# Patient Record
Sex: Male | Born: 1940 | Race: White | Hispanic: No | Marital: Married | Smoking: Never smoker
Health system: Southern US, Community
[De-identification: ages and names within clinical notes are randomized; demographics above are authoritative.]

## PROBLEM LIST (undated history)

## (undated) DIAGNOSIS — T8859XA Other complications of anesthesia, initial encounter: Secondary | ICD-10-CM

## (undated) DIAGNOSIS — F419 Anxiety disorder, unspecified: Secondary | ICD-10-CM

## (undated) DIAGNOSIS — H9191 Unspecified hearing loss, right ear: Secondary | ICD-10-CM

## (undated) DIAGNOSIS — I10 Essential (primary) hypertension: Secondary | ICD-10-CM

## (undated) DIAGNOSIS — C61 Malignant neoplasm of prostate: Secondary | ICD-10-CM

## (undated) DIAGNOSIS — I499 Cardiac arrhythmia, unspecified: Secondary | ICD-10-CM

## (undated) DIAGNOSIS — Z87438 Personal history of other diseases of male genital organs: Secondary | ICD-10-CM

## (undated) DIAGNOSIS — T4145XA Adverse effect of unspecified anesthetic, initial encounter: Secondary | ICD-10-CM

## (undated) DIAGNOSIS — R29810 Facial weakness: Secondary | ICD-10-CM

## (undated) DIAGNOSIS — E785 Hyperlipidemia, unspecified: Secondary | ICD-10-CM

## (undated) DIAGNOSIS — F329 Major depressive disorder, single episode, unspecified: Secondary | ICD-10-CM

## (undated) DIAGNOSIS — G709 Myoneural disorder, unspecified: Secondary | ICD-10-CM

## (undated) DIAGNOSIS — F32A Depression, unspecified: Secondary | ICD-10-CM

## (undated) DIAGNOSIS — E119 Type 2 diabetes mellitus without complications: Secondary | ICD-10-CM

## (undated) DIAGNOSIS — Q8502 Neurofibromatosis, type 2: Secondary | ICD-10-CM

## (undated) DIAGNOSIS — Z87898 Personal history of other specified conditions: Secondary | ICD-10-CM

## (undated) DIAGNOSIS — I482 Chronic atrial fibrillation, unspecified: Secondary | ICD-10-CM

## (undated) HISTORY — PX: EYE SURGERY: SHX253

## (undated) HISTORY — PX: CHOLECYSTECTOMY: SHX55

## (undated) HISTORY — PX: TRANSTHORACIC ECHOCARDIOGRAM: SHX275

## (undated) HISTORY — PX: OTHER SURGICAL HISTORY: SHX169

---

## 1988-10-20 HISTORY — PX: CARDIAC CATHETERIZATION: SHX172

## 1989-06-20 HISTORY — PX: UMBILICAL HERNIA REPAIR: SHX196

## 1999-10-21 ENCOUNTER — Emergency Department (HOSPITAL_COMMUNITY): Admission: EM | Admit: 1999-10-21 | Discharge: 1999-10-21 | Payer: Self-pay | Admitting: Emergency Medicine

## 2001-01-14 ENCOUNTER — Encounter: Payer: Self-pay | Admitting: Internal Medicine

## 2001-01-14 ENCOUNTER — Ambulatory Visit (HOSPITAL_COMMUNITY): Admission: RE | Admit: 2001-01-14 | Discharge: 2001-01-14 | Payer: Self-pay | Admitting: Internal Medicine

## 2001-02-10 ENCOUNTER — Encounter: Payer: Self-pay | Admitting: Neurosurgery

## 2001-02-12 ENCOUNTER — Inpatient Hospital Stay (HOSPITAL_COMMUNITY): Admission: RE | Admit: 2001-02-12 | Discharge: 2001-02-22 | Payer: Self-pay | Admitting: Neurosurgery

## 2001-02-12 HISTORY — PX: CRANIECTOMY FOR EXCISION OF ACOUSTIC NEUROMA: SUR324

## 2001-11-29 ENCOUNTER — Encounter (INDEPENDENT_AMBULATORY_CARE_PROVIDER_SITE_OTHER): Payer: Self-pay | Admitting: Specialist

## 2001-11-29 ENCOUNTER — Ambulatory Visit (HOSPITAL_BASED_OUTPATIENT_CLINIC_OR_DEPARTMENT_OTHER): Admission: RE | Admit: 2001-11-29 | Discharge: 2001-11-30 | Payer: Self-pay | Admitting: Specialist

## 2002-08-02 ENCOUNTER — Encounter: Admission: RE | Admit: 2002-08-02 | Discharge: 2002-08-02 | Payer: Self-pay | Admitting: Internal Medicine

## 2002-08-02 ENCOUNTER — Encounter: Payer: Self-pay | Admitting: Internal Medicine

## 2003-02-06 ENCOUNTER — Encounter (INDEPENDENT_AMBULATORY_CARE_PROVIDER_SITE_OTHER): Payer: Self-pay | Admitting: *Deleted

## 2003-02-06 ENCOUNTER — Ambulatory Visit (HOSPITAL_BASED_OUTPATIENT_CLINIC_OR_DEPARTMENT_OTHER): Admission: RE | Admit: 2003-02-06 | Discharge: 2003-02-06 | Payer: Self-pay | Admitting: Specialist

## 2007-06-28 ENCOUNTER — Emergency Department (HOSPITAL_COMMUNITY): Admission: EM | Admit: 2007-06-28 | Discharge: 2007-06-28 | Payer: Self-pay | Admitting: Emergency Medicine

## 2007-09-20 ENCOUNTER — Encounter: Admission: RE | Admit: 2007-09-20 | Discharge: 2007-09-20 | Payer: Self-pay | Admitting: Internal Medicine

## 2007-11-26 ENCOUNTER — Encounter (INDEPENDENT_AMBULATORY_CARE_PROVIDER_SITE_OTHER): Payer: Self-pay | Admitting: Urology

## 2007-11-26 ENCOUNTER — Ambulatory Visit (HOSPITAL_BASED_OUTPATIENT_CLINIC_OR_DEPARTMENT_OTHER): Admission: RE | Admit: 2007-11-26 | Discharge: 2007-11-26 | Payer: Self-pay | Admitting: Urology

## 2007-11-26 HISTORY — PX: HYDROCELE EXCISION: SHX482

## 2011-03-04 NOTE — Op Note (Signed)
NAME:  TREVAR, BOEHRINGER               ACCOUNT NO.:  [id number]   MEDICAL RECORD NO.:  [id number]          PATIENT TYPE:  AMB   LOCATION:  [facility]                         FACILITY:  [facility]   PHYSICIAN:  Ronald L. Earlene Plater, M.D.  DATE OF BIRTH:  05-12-1941   DATE OF PROCEDURE:  11/26/2007  DATE OF DISCHARGE:                               OPERATIVE REPORT   DIAGNOSIS:  Right hydrocele.   OPERATIVE PROCEDURE:  Right hydrocelectomy.   SURGEON:  Laurin Coder, MD   ANESTHESIA:  LMA.   ESTIMATED BLOOD LOSS:  Fifteen mL.   TUBES:  Quarter inch Penrose drain.   COMPLICATIONS:  None.   INDICATIONS FOR PROCEDURE:  Mr. Chausse is a very nice 70 year old white  male who presented with enlarging bilateral hydroceles, the left  hydrocele was pretty minimal but the right hydrocele was fairly large.  It had become unsightly and uncomfortable and an ultrasound appeared to  be a simple hydrocele.  After understanding risks, benefits and  alternatives, stopping his Coumadin and cleared for surgery he has  elected to proceed.   PROCEDURE IN DETAIL:  The patient was placed in supine position after  proper LMA anesthesia and was prepped and draped with Betadine in a  sterile fashion.  A transverse right scrotal incision was made.  Sharp  dissection was carried down to the hydrocele sac.  It was drained with  straw-colored fluid and delivered on the field.  There were several  loculations that were opened.  The redundant tunica vaginalis which was  somewhat thin was taken down from the cord, excised and submitted as  hydrocele sac and a bottle neck type closure was made with a running 4-0  chromic catgut carefully avoiding any constriction on the cord.  All  mucosal surfaces were exposed.  Good hemostasis was noted to be present.  The testicle and adnexa were placed back into the scrotal sac.  A  quarter inch Penrose drain was placed through a separate stab incision  and sutured in place with 3-0 chromic  catgut.  The dartos tunic was then  closed with a running locked 3-0 chromic catgut.  The skin was closed  with a running horizontal mattress 3-0 chromic catgut and dressed with  fluffs and scrotal support.  The patient tolerated the procedure well  and was taken to the recovery room stable.      Ronald L. Earlene Plater, M.D.  Electronically Signed     RLD/MEDQ  D:  11/26/2007  T:  11/27/2007  Job:  [id number]

## 2011-03-07 NOTE — Op Note (Signed)
NAME:  Tommy Weeks, Tommy Weeks                           ACCOUNT NO.:  [id number]   MEDICAL RECORD NO.:  [id number]                   PATIENT TYPE:  AMB   LOCATION:  [facility]                                  FACILITY:  [facility]   PHYSICIAN:  Earvin Hansen L. Shon Weeks, M.D.           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  The patient had previously undergone some surgery for some  suspensions but now has some sagging at the brow, right lateral canthal  area, and the right commissure of his mouth and facial area still.  He has  some improvement but we tried over the last year intensive physical therapy  including neurostimulation of the areas with no avail.   PROCEDURES:  Exploration of the right face, retightening of previous fascial  slings; fasciculata grafts to the right brow, right lateral orbital region,  and orbicularis muscle.  Insertion at the angularis of the right mouth and  cheek areas.  Right lateral canthoplasty and relocation.   ANESTHESIA:  General.   SURGEON:  Tommy Weeks, M.D.   FIRST ASSISTANT:  Dalbert Garnet, CFA and OPA   DESCRIPTION OF PROCEDURE:  The patient was taken to the operating room,  placed on the operating room table in the supine position.  He was given  adequate general anesthesia and intubated orally.  Prep was done to his face  and neck areas with Hibiclens solution.  The eyes were protected with  Betadine swabs, walled off with sterile towels and draped so as to make a  sterile field.  A marking pen was used to outline the vectors of lift in the  right orbital and the right facial areas.  The skin flaps were anesthetized  with Xylocaine 0.5% with epinephrine 1:200,000 concentration, a total of 60  mL.  After  waiting an appropriate amount of time for vasoconstriction to  take place, a facelift incision was made through the right forehead, across  the hairline area down to the sideburns and the preauricular incision was  carried down to the ear lobule.  Skin flap was dissected over the areas  exposing the previously rotated temporalis major flap that was rotated down  to lift the brow, right and left lateral contours, and the canthi and hooked  into the orbital musculature.  There was intense scarring in this area.  I  was able to dissect out the canthal ligament using a sharp dissection and  blunt dissection.  I was able to transect it and relocate it into the right  periosteal area using a 2-0 Ethibond suture.  The orbicularis oculus muscle  was also tightened back up, as well as previous fascia  slings that were  isolated out with some difficulty because of increased scarring and they  were suspended up using the 2-0 Ethibond suture.  The same was carried out  over the right lateral face mastoid area.  Insertions into the angle of the  mouth were still intact but there seemed to have been some loosening of the  fascia proximally.  This was dissected out with tedious dissection.  It was  then replanted back into the upper part of the SMAS to give good lifting to  the corner of the mouth, facial areas, as well as right lateral canthus and  orbicularis area.  A brow was also lifted more by grasping some of the  frontalis musculature still present and then suturing it in a plicating  fashion to the periosteum.  After prompt hemostasis, the skin flaps were  then brought up and excised appropriately.  Subcutaneous closure was done  with 3-0 Monocryl x2 layers and a running 5-0 nylon and skin staples  throughout the area.   ADDENDUM:  We were able to do some more fascial slings to the right lateral  canthi area and brow by retrieving fascia from the right lateral thigh area  with a fascial stripper.   These were tied in with 4-0 Mersilene sutures.  The same was done for the right corner of the mouth.  All the wounds were  cleansed.  Steri-Strips and a soft dressing applied appropriately.  Xeroform, 4 x 4's, a facial dressing, and a thigh dressing.  He withstood  the procedure very well and was taken to recovery in excellent condition.   ESTIMATED BLOOD LOSS:  100 mL.   COMPLICATIONS:  None.                                               Yaakov Guthrie. Shon Weeks, M.D.    Cathie Hoops  D:  02/06/2003  T:  02/06/2003  Job:  [id number]

## 2011-03-07 NOTE — H&P (Signed)
[place]. [facility]  Patient:    Tommy Weeks, Tommy Weeks                        MRN: [id number] Adm. Date:  02/12/01 Attending:  Payton Doughty, M.D.                         History and Physical  ADMISSION DIAGNOSIS: Right acoustic neuroma.  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  PAST MEDICAL HISTORY: Benign.  He has had a little bit of hypertension, for which he is on Toprol 150 mg q.d.  CURRENT MEDICATIONS:  1. Toprol 150 mg q.d.  2. Hydrochlorothiazide 12.5 mg q.d.  3. Lipitor 5 mg q.d.  4. Tricor 200 mg q.d.  ALLERGIES: He has a hypersensitivity reaction to VERSED and ROBINUL.  REVIEW OF SYSTEMS: He last week had a bout of dizziness and his blood pressure was elevated, but it was felt it was related to anxiety over the operation he. He is fine now.  PAST SURGICAL HISTORY:  1. Cholecystectomy in 1996.  2. Hernia repair in 1996 and 1989.  SOCIAL HISTORY: He neither smokes nor drinks.  He is an air traffic controller.  FAMILY HISTORY: Mother is in poor health, with cardiac disease.  Father is 58 and in fair health.  REVIEW OF SYSTEMS: Remarkable for wearing glasses, hearing loss, tinnitus, and hypertension.  PHYSICAL EXAMINATION:  HEENT: Within normal limits.  NECK: Good range of motion.  CHEST: Clear.  CARDIAC: Regular rate and rhythm.  ABDOMEN: Nontender, no hepatosplenomegaly.  EXTREMITIES: Without clubbing or cyanosis.  Peripheral pulses are good.  GU: Examination deferred.  NEUROLOGIC: He is awake and alert and oriented.  PERRL.  EOMI.  Has a little bit of diplopia and far lateral leftward gaze.  Facial sensation is diminished in the  right V2 and V3 distribution.  Facial movement appears to be normal. He has no hearing in his right ear.  The palate elevates in the midline.  He does not describe any swallowing difficulties.  Trapezius and sternocleidomastoid function appear normal.  Tongue is midline.  Motor examination demonstrates 5/5 strength throughout the upper and lower extremities, with no pronator drift.  There is no current sensory deficit. Reflexes are 1 throughout.  Toes downgoing bilaterally.  No Hoffmanns.  LABORATORY DATA: MRI demonstrates a homogeneously enhancing cystic lesion of the right cerebellar pontine angle, which appears to have extension into the right porus acusticus.  CLINICAL IMPRESSION: Probable acoustic neuroma.  It is less likely it is meningioma.  PLAN: The plan is for a right suboccipital craniectomy based on the tests for resection of acoustic neuroma.  Will do a seventh nerve monitoring.  The risks and benefits of this approach have been discussed with him and he wishes to proceed. DD:  02/12/01 TD:  02/12/01 Job: [id number]

## 2011-03-07 NOTE — Op Note (Signed)
[place]. [facility]  Patient:    Tommy Weeks, Tommy Weeks                        MRN: [id number] Proc. Date: 02/12/01 Adm. Date:  [id number] Attending:  Emeterio Reeve                           Operative Report  PREOPERATIVE DIAGNOSIS:  Right cerebellar pontine angle tumor.  POSTOPERATIVE DIAGNOSIS:  Right acoustic neuroma.  SURGEON:  Payton Doughty, M.D.  ANESTHESIA:  General endotracheal.  PREP:  Shave and prepped, scrubbed with alcohol wipe.  COMPLICATIONS:  None.  ASSISTANT:  Stefani Dama, M.D.  NOTE:  Intraoperative seventh cranial nerve monitoring was used.  BODY OF TEXT:  This is a 70 year old right-handed gentleman with a right cerebellar pontine angle tumor.  He was taken to the operating room, smoothly anesthetized, intubated and placed in left side down, right side up lateral decubitus position with the head turned slightly towards the left angling the petrous ridge at 90 degrees to the floor.  Following shave, prepped and draped in usual sterile fashion.  A lightning bolt shaped incision was made starting just above the mastoid on the right side, carried down to the superior nuchal line medially half way to the inion and then inferiorly approximately to the level of C2.  The occipital squama was uncovered and the high-speed drill perforator, Kerrison and a Leksell were used for craniectomy out to the ______ angle on the right paralleling the sigmoid sinus inferiorly and medially and superiorly along the transverse sinus.  Bone wax was used to control any bony bleeding.  The mastoid air cells were entered and they were packed with a piece of muscle obtained from the neck musculature.  The dura was opened in a stellate fashion and retractor placed on the superior edge of the cerebellum.  It was gently carried retracted inferomedially and the petrous bone was uncovered.  The chamber was immediately obvious.  It was densely adherent to the  cerebellum and the microscope was brought in.  Using the operating microscope and microdissection technique, the tumor was dissected free from the cerebellum.  The cyst and tumor was identified and opened and the tumor was immediately shrank in size.  The tumor was dissected off the cerebellum.  The seventh nerve was identified visually and then confirmed using intraoperative cranial nerve monitoring of the EMG and the stimulator.  The tumor was dissected off the seventh nerve, debulked internally and then removed grossly.  Following its removal, the fifth nerve was immediately obvious.  The brainstem was decompressed without any undue irritation.  The porus acusticus was quite expanded by the tumor.  The original intention had been to drill the porus, however, because of the large size of the porus, the tumor was simply curetted free.  This was done while monitoring the seventh nerve and there was absolutely no evidence of seventh nerve damage while removing the tumor from the porus.  The gross total resection was obtained.  The wound was irrigated and hemostasis assured.  The dura was reapproximated with 4-0 Nurolon in an interrupted fashion in a nonwatertight fashion.  The mastoids were carefully inspected and found to be completely waxed and packed with bone.  Gelfoam was placed over the craniectomy defect.  The paraspinous muscles were approximated with 0 Vicryl in an interrupted fashion.  Subcutaneous tissues were reapproximated with 0  Vicryl in an interrupted fashion.  Subcuticular tissues were reapproximated with 3-0 Vicryl in an interrupted fashion and the skin was closed with a 3-0 Vicryl in a running lock fashion.  A Betadine and Telfa dressing was applied and made occlusive with OpSite.  The patient returned to recovery room.  It should be noted that the stimulation threshold for the seventh nerve was 0.17 volts when we started and it increased 0.3 volts.  There was  good stimulation obtained throughout the case. DD:  02/12/01 TD:  02/13/01 Job: [id number]

## 2011-03-07 NOTE — Op Note (Signed)
[place]. [facility]  Patient:    Tommy Weeks, Tommy Weeks Visit Number: [id number] MRN: [id number]          Service Type: DSU Location: [facility] Attending Physician:  Gustavus Messing Dictated by:   Yaakov Guthrie. Shon Hough, M.D. Proc. Date: 11/29/01 Admit Date:  11/29/2001   CC:         Payton Doughty, M.D.   Operative Report  INDICATIONS:  This 70 year old gentleman is status post right acoustic neuroma excision with salvage of the right facial nerve.  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  He has increased exotropia involving the right lower lid causing him to use eye drops incessantly during the day and the night.  He also has visual field discrepancy with the right superior field distorted, secondary to ptosis of his right brow.  He has sagging of his right facial and right neck and jaw areas, with inability to smile or to close the right commissure.  PROCEDURES PLANNED 1. Exploration of the right face, right temporalis muscle and nerve pedicle. 2. Flap reconstruction to be swung to the right lower lid, medial    canthal area. 3. Right brow lift using the miniplate system. 4. Right face lift with plication of this superficial musculoaponeurotic, as    well as bilateral masseter muscle splits with connection to the right    upper and lower lip commissure areas. 5. The patient also demonstrates imbalance of the right and left face.  He    will have a left face lift to improve the symmetry of the above-proposed    procedures. 6. In addition the patient will have tensor fascia lata strips for    suspension of all of the above-mentioned areas.  SURGEON:  Yaakov Guthrie. Shon Hough, M.D.  ASSISTANT:  Margaretha Sheffield.  ANESTHESIA:  General.  DESCRIPTION OF PROCEDURE:  Preoperatively the patient was set up and drawn for itemizations and areas of concern.  He then underwent general anesthesia and was intubated orally.  A prep was done to the face  and neck areas in a routine fashion using a Betadine soap an solution, and walled off with sterile towels and drapes, so as to make a sterile field.  Xylocaine 0.25% with epinephrine was injected locally in the right and left face and postauricular areas in the neck.  After waiting an appropriate amount of time, a right traditional face lift incision was made over the sideburns, upper temporal, and scalp areas. The patient did demonstrate alopecia and a receding hair line.  We followed the hair line around, so postauricularly the incision was carried into the hair line.  Using the face lift scissors to dissect the plane superficially and subcutaneously over the SMAS.  The temporalis musculature was isolated and the fascia was also isolated.  Measurements were drawn for a temporalis muscle nerve pedicle flap based posteriorly.  This was elevated off and was still attached to the temporalis artery.  Next, the fascia was released over the pedicle, and was then dissected upwards to elongate the whole flap.  At the end #3-0 silk was placed to prevent the fascia from being stripped off.  A right infraciliary incision was carried out laterally.  Dissection was carried out visualizing the orbicularis ocular musculature which was very, very flaccid.  Also dissection was carried over the right lateral canthal area using sharp scissors.  I was able to identify the canthus as it transcended into the periosteum.  The plane was then  made between the temporalis and the right orbital area, and the flap that was previously lifted was then placed through the canal, and then the muscle of the temporalis was sutured to the orbicularis musculature using #3-0 Mersilene suture, buried knots, and then the fascia proper was taken over, and completing the dissection and connection to the orbicularis medially and brought out to the medial canthal area.  This secured with a miniplate screw x 2.  This gave excellent  lifting of the right lateral canthal area, and some of the zygothalamus that the patient demonstrated preoperatively.  Next, the dissection was carried over the right face.  The masseter muscle was identified.  The muscle belly was split in the midportion leaving a sterile base posteriorly.  Then the flaps were then swung to the right upper lip area to the dermis, and the area is secured with #2-0 Prolene, and the same was done to the lower lip and commissure areas, giving an excellent suspension to the area which was over-corrected somewhat, knowing that it is going to settle down a little bit more than what we are going to do today.  Irrigation was done.  Hemostasis was maintained with the Bovie under coagulation.  After this the face was packed with saline-soaked gauze.  Next, attention was drawn to the left face.  The same procedure was carried out for a face lift procedure, down to the underlying platysma musculature. The submental area was also developed.  The platysma was plicated in the midline portion with a #2-0 running Prolene.  The SMAS was plicated on the left facial area with #3-0 Mersilene sutures.  Excess skin was brought up and trimmed appropriately and secured with #3-0 Vicryl subcutaneously.  The skin was then stapled to the hair line areas, and then the skin edges were reapproximated with meticulous sutures of #5-0 nylon.  The wounds were drained with a #10 Blake drain, which was placed in the depths of the incision and the drain and brought out through the lateral-most portion of the postauricular area.  Next, attention was drawn to the right thigh area which had been prepped similarly with a sterile technique.  The tensor fascia lata strippers were then used to remove large strips of tensor fascia lata approximately 3/8th of an inch wide, and approximately 10 inches long.  That defect was then closed with #3-0 Vicryl x 2 layers and a running subcuticular stitch of  #3-0 Vicryl. The strips were then placed.  They were split so that we could place them in  the right upper and lower lip areas, and then swung it on the masseter muscle and secured with #2-0 Prolene.  The same procedure was carried out on the right lateral canthal area where the suture was placed with Mersilene #3-0 into the lateral canthus.  Elevation was done through to the temporalis area. Also the forehead was lifted somewhat.  The brow was lifted with a miniplate screw after the frontalis musculature had been free and resuspended.  After irrigation the skin was then brought up and draped in the appropriate fashion for a face lift, over-corrected slightly.  Excess skin was trimmed.  The subcutaneous was closed down with #3-0 Vicryl x 2 layers and running #5-0 nylon skin staples.  The wounds were drained again with a #10 Blake drain which was placed in the depths of the wound and brought out through the lateral-most portion of the incision and secured with #3-0 Prolene. Submentally the wound was closed with #5-0  nylon.  After being happy with the surgery, the ciliary incision was closed with #5-0 silk on the right side, as well as the medial canthal area.  Xeroform gauze, 4 x 4s, ABD, and Hypafix tape were applied to all the areas in a mummy-type dressing.  He withstood the procedures very well.  The estimated blood loss was 250 cc.  COMPLICATIONS:  None.  DISPOSITION:  Mr. Dettman is to be admitted overnight to the hospital for observation, and then discharged home.  He will see me back in the office within the 24-48 hours.  He is to call for any medical problems. Dictated by:   Yaakov Guthrie. Shon Hough, M.D. Attending Physician:  Gustavus Messing DD:  11/29/01 TD:  11/29/01 Job: [id number]

## 2011-03-07 NOTE — Discharge Summary (Signed)
[place]  Patient:    Tommy Weeks, Tommy Weeks                        MRN: [id number] Adm. Date:  [id number] Disc. Date: [id number] Attending:  Emeterio Reeve                           Discharge Summary  ADMISSION DIAGNOSIS:  Right cerebellar pontine angle tumor.  DISCHARGE DIAGNOSIS:  Right acoustic neuroma.  PROCEDURE:  Right suboccipital craniectomy for tumor resection.  SERVICE:  Neurosurgery.  COMPLICATIONS:  None.  DISCHARGE STATUS:  Alive and well.  HISTORY OF PRESENT ILLNESS:  A 70 year old right-handed white gentleman who, three weeks prior to admission, had the onset of numbness in the right face and he noticed difficulty with his hearing and he had no functional hearing in the right ear.  MRI demonstrated right cerebellar pontine angle tumor and he was referred to me.  PAST MEDICAL HISTORY:  Benign.  He has hypertension for which he is on Toprol 150 mg a day.  MEDICATIONS:  Toprol, hydrochlorothiazide, Lipitor and Tricor.  ALLERGIES:  Hypersensitivity reaction to VERSED and ROBINUL.  REVIEW OF SYSTEMS:  Remarkable for dizziness and a bit of anxiety.  PAST SURGICAL HISTORY:  Cholecystectomy and hernia repair.  SOCIAL HISTORY:  He does not smoke or drink.  He is an air traffic controller.  PHYSICAL EXAMINATION:  GENERAL:  Examination was unremarkable and blood pressure was stable.  NEUROLOGIC:  He is awake, alert and oriented.  His cranial nerves showed he had a little bit of right sixth nerve palsy.  Facial sensation is diminished in the right V2 and V3 distribution.  Facial movement was normal.  He had no hearing in his right ear.  Otherwise he is neurologically intact.  HOSPITAL COURSE:  He was admitted after ascertainment of normal laboratory values and underwent a right suboccipital craniectomy tumor resection with seventh nerve intraoperative monitoring.  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  He postoperatively has had one or  two episodes of rhinorrhea, none in the past four days.  These episodes were one or two drops of spinal fluid.  He is now ready for discharge.  He has mild right sixth which is better than preoperatively.  He has a peripheral seventh nerve palsy.  It should be noted that intraoperatively his stimulation threshold for the seventh nerve went from 0.1 to 0.3 volts which is minimal increase and I suspect that he will have a complete recovery of his seventh nerve.  He has a little bit of facial numbness in V2 and V3 distribution on the right side.  The tumor itself had expanded to the point where it is compressing the fifth nerve on the right so once again I suspect this will resolve as time passes.  Currently, his incision is dry and well-healed.  His cranial nerve deficits remain stable and he is otherwise neurologically intact.  There is no CSF leak in the past four days.  DISCHARGE MEDICATIONS:  He is being discharged home on Prozac and Tylenol No. 3.  FOLLOW-UP:  His follow-up will be in the [facility] office in one week for suture removal and he knows if there is CSF leaking, he should call. DD:  02/22/01 TD:  02/23/01 Job: [id number]

## 2011-05-26 ENCOUNTER — Other Ambulatory Visit: Payer: Self-pay | Admitting: Dermatology

## 2011-07-11 LAB — BASIC METABOLIC PANEL
Calcium: 9.5
Creatinine, Ser: 1.2
GFR calc Af Amer: >60
GFR calc non Af Amer: >60
Sodium: 140

## 2011-07-11 LAB — PROTIME-INR
INR: 1.2
Prothrombin Time: 15.6 — ABNORMAL HIGH

## 2011-07-11 LAB — APTT: aPTT: 28

## 2011-07-11 LAB — CBC
Hemoglobin: 17.4 — ABNORMAL HIGH
MCHC: 35.3
RBC: 5.83 — ABNORMAL HIGH
RDW: 13.2

## 2011-08-01 LAB — TYPE AND SCREEN
ABO/RH(D): A POS
Antibody Screen: NEGATIVE

## 2011-08-01 LAB — DIFFERENTIAL
Basophils Absolute: 0
Basophils Relative: 1
Lymphocytes Relative: 39
Neutro Abs: 3.7
Neutrophils Relative %: 50

## 2011-08-01 LAB — BASIC METABOLIC PANEL
CO2: 21
Calcium: 9.8
Creatinine, Ser: 1.09
GFR calc non Af Amer: >60
Glucose, Bld: 174 — ABNORMAL HIGH
Sodium: 138

## 2011-08-01 LAB — URINALYSIS, ROUTINE W REFLEX MICROSCOPIC
Bilirubin Urine: NEGATIVE
Hgb urine dipstick: NEGATIVE
Nitrite: NEGATIVE
Protein, ur: NEGATIVE
Specific Gravity, Urine: 1.018
Urobilinogen, UA: 1

## 2011-08-01 LAB — HEPATIC FUNCTION PANEL
ALT: 25
AST: 32
Albumin: 4.1
Bilirubin, Direct: 0.2
Total Protein: 7.2

## 2011-08-01 LAB — POCT CARDIAC MARKERS
CKMB, poc: <1 — ABNORMAL LOW
Operator id: 1627
Troponin i, poc: <0.05

## 2011-08-01 LAB — PROTIME-INR: INR: 2.1 — ABNORMAL HIGH

## 2011-08-01 LAB — CBC
MCHC: 35
Platelets: 215
RDW: 12.8

## 2011-08-01 LAB — ABO/RH: ABO/RH(D): A POS

## 2012-05-25 ENCOUNTER — Other Ambulatory Visit: Payer: Self-pay | Admitting: Dermatology

## 2013-02-10 ENCOUNTER — Other Ambulatory Visit: Payer: Self-pay | Admitting: Internal Medicine

## 2013-02-10 DIAGNOSIS — R634 Abnormal weight loss: Secondary | ICD-10-CM

## 2013-02-14 ENCOUNTER — Emergency Department (HOSPITAL_COMMUNITY): Payer: Medicare Other

## 2013-02-14 ENCOUNTER — Emergency Department: Payer: Self-pay

## 2013-02-14 ENCOUNTER — Emergency Department (HOSPITAL_COMMUNITY)
Admission: EM | Admit: 2013-02-14 | Discharge: 2013-02-14 | Disposition: A | Discharge status: Home / Self Care | Payer: Medicare Other | Attending: Emergency Medicine | Admitting: Emergency Medicine

## 2013-02-14 ENCOUNTER — Encounter (HOSPITAL_COMMUNITY): Payer: Self-pay | Admitting: Emergency Medicine

## 2013-02-14 DIAGNOSIS — I4891 Unspecified atrial fibrillation: Secondary | ICD-10-CM | POA: Insufficient documentation

## 2013-02-14 DIAGNOSIS — N400 Enlarged prostate without lower urinary tract symptoms: Secondary | ICD-10-CM | POA: Insufficient documentation

## 2013-02-14 DIAGNOSIS — Z8719 Personal history of other diseases of the digestive system: Secondary | ICD-10-CM | POA: Insufficient documentation

## 2013-02-14 DIAGNOSIS — R634 Abnormal weight loss: Secondary | ICD-10-CM

## 2013-02-14 DIAGNOSIS — R110 Nausea: Secondary | ICD-10-CM | POA: Insufficient documentation

## 2013-02-14 DIAGNOSIS — Z79899 Other long term (current) drug therapy: Secondary | ICD-10-CM | POA: Insufficient documentation

## 2013-02-14 DIAGNOSIS — Z7901 Long term (current) use of anticoagulants: Secondary | ICD-10-CM | POA: Insufficient documentation

## 2013-02-14 DIAGNOSIS — R109 Unspecified abdominal pain: Secondary | ICD-10-CM | POA: Insufficient documentation

## 2013-02-14 DIAGNOSIS — Z8669 Personal history of other diseases of the nervous system and sense organs: Secondary | ICD-10-CM | POA: Insufficient documentation

## 2013-02-14 DIAGNOSIS — R197 Diarrhea, unspecified: Secondary | ICD-10-CM | POA: Insufficient documentation

## 2013-02-14 DIAGNOSIS — I10 Essential (primary) hypertension: Secondary | ICD-10-CM | POA: Insufficient documentation

## 2013-02-14 DIAGNOSIS — Z9889 Other specified postprocedural states: Secondary | ICD-10-CM | POA: Insufficient documentation

## 2013-02-14 DIAGNOSIS — E119 Type 2 diabetes mellitus without complications: Secondary | ICD-10-CM | POA: Insufficient documentation

## 2013-02-14 HISTORY — DX: Essential (primary) hypertension: I10

## 2013-02-14 HISTORY — DX: Facial weakness: R29.810

## 2013-02-14 LAB — URINALYSIS, ROUTINE W REFLEX MICROSCOPIC
Bilirubin Urine: NEGATIVE
Glucose, UA: NEGATIVE mg/dL
Hgb urine dipstick: NEGATIVE
Ketones, ur: NEGATIVE mg/dL
Leukocytes, UA: NEGATIVE
Nitrite: NEGATIVE
Protein, ur: NEGATIVE mg/dL
Specific Gravity, Urine: 1.017 (ref 1.005–1.030)
Urobilinogen, UA: 0.2 mg/dL (ref 0.0–1.0)
pH: 5.5 (ref 5.0–8.0)

## 2013-02-14 LAB — POCT I-STAT, CHEM 8
BUN: 18 mg/dL (ref 6–23)
Creatinine, Ser: 1.2 mg/dL (ref 0.50–1.35)
Glucose, Bld: 141 mg/dL — ABNORMAL HIGH (ref 70–99)
Hemoglobin: 18.7 g/dL — ABNORMAL HIGH (ref 13.0–17.0)
Potassium: 3.3 mEq/L — ABNORMAL LOW (ref 3.5–5.1)
Sodium: 140 mEq/L (ref 135–145)

## 2013-02-14 LAB — CBC WITH DIFFERENTIAL/PLATELET
Basophils Absolute: 0 10*3/uL (ref 0.0–0.1)
Basophils Relative: 0 % (ref 0–1)
Hemoglobin: 17.7 g/dL — ABNORMAL HIGH (ref 13.0–17.0)
Lymphocytes Relative: 31 % (ref 12–46)
MCHC: 35.1 g/dL (ref 30.0–36.0)
Monocytes Relative: 11 % (ref 3–12)
Neutro Abs: 4.8 10*3/uL (ref 1.7–7.7)
Neutrophils Relative %: 57 % (ref 43–77)
RDW: 13.5 % (ref 11.5–15.5)
WBC: 8.4 10*3/uL (ref 4.0–10.5)

## 2013-02-14 LAB — COMPREHENSIVE METABOLIC PANEL
AST: 22 U/L (ref 0–37)
Albumin: 4.1 g/dL (ref 3.5–5.2)
Alkaline Phosphatase: 39 U/L (ref 39–117)
Chloride: 97 mEq/L (ref 96–112)
Potassium: 3.4 mEq/L — ABNORMAL LOW (ref 3.5–5.1)
Total Bilirubin: 0.9 mg/dL (ref 0.3–1.2)

## 2013-02-14 MED ORDER — PROMETHAZINE HCL 25 MG PO TABS: 12.5000 mg | ORAL_TABLET | Freq: Four times a day (QID) | ORAL | Status: DC | PRN

## 2013-02-14 MED ORDER — IOHEXOL 300 MG/ML  SOLN
100.0000 mL | Freq: Once | INTRAMUSCULAR | Status: AC | PRN
  Administered 2013-02-14: 100 mL via INTRAVENOUS

## 2013-02-14 MED ORDER — SODIUM CHLORIDE 0.9 % IV BOLUS (SEPSIS)
1000.0000 mL | Freq: Once | INTRAVENOUS | Status: AC
  Administered 2013-02-14: 1000 mL via INTRAVENOUS

## 2013-02-14 MED ORDER — IOHEXOL 300 MG/ML  SOLN
50.0000 mL | Freq: Once | INTRAMUSCULAR | Status: AC | PRN
  Administered 2013-02-14: 50 mL via ORAL

## 2013-02-14 MED ORDER — ONDANSETRON 4 MG PO TBDP: 4.0000 mg | ORAL_TABLET | Freq: Three times a day (TID) | ORAL | Status: DC | PRN

## 2013-02-14 MED ORDER — SODIUM CHLORIDE 0.9 % IV SOLN: Freq: Once | INTRAVENOUS | Status: DC

## 2013-02-14 NOTE — ED Provider Notes (Signed)
History     CSN: [id number]  Arrival date & time 02/14/13  0746   First MD Initiated Contact with Patient 02/14/13 [phone]      Chief Complaint  Patient presents with  . Abdominal Pain    (Consider location/radiation/quality/duration/timing/severity/associated sxs/prior treatment) HPI Comments: 72 y/o with hx of one month of weight loss as needed with nausea when eating and upper abdominal pain. This has been persistent over the month, gradually worsening, not associated with decreased urine output , fevers, chills, diarrhea, dysuria or rectal bleeding. He does state his stools have been loose. He has been worked up by his family Dr. with plain x-rays and lab work, was supposed to have a CT scan today but due to the increased symptoms felt as if he had to come to the hospital for expedited workup. He does have a distant history of an acoustic neuroma that required resection many years ago and left him with a right facial nerve palsy. No other history of cancer. He does have diabetes, atrial fibrillation, hypertension  Patient is a 72 y.o. male presenting with abdominal pain. The history is provided by the patient.  Abdominal Pain   Past Medical History  Diagnosis Date  . Atrial fibrillation   . Diabetes mellitus without complication   . Hypertension   . Acoustic neuroma   . 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 Hernia     Past Surgical History  Procedure Laterality Date  . Brain surgery      History reviewed. No pertinent family history.  History  Substance Use Topics  . Smoking status: Never Smoker   . Smokeless tobacco: Not on file  . Alcohol Use: No      Review of Systems  Gastrointestinal: Positive for abdominal pain.  All other systems reviewed and are negative.    Allergies  Review of patient's allergies indicates no known allergies.  Home Medications   Current Outpatient Rx  Name  Route  Sig  Dispense  Refill  . amLODipine (NORVASC) 5 MG  tablet   Oral   Take 2.5 mg by mouth daily.         Marland Kitchen buPROPion (WELLBUTRIN) 100 MG tablet   Oral   Take 100 mg by mouth daily.         Marland Kitchen diltiazem (CARDIZEM CD) 240 MG 24 hr capsule   Oral   Take 240 mg by mouth daily.         . fenofibrate (TRICOR) 145 MG tablet   Oral   Take 145 mg by mouth daily.         . fish oil-omega-3 fatty acids 1000 MG capsule   Oral   Take 2 g by mouth daily.         Marland Kitchen LORazepam (ATIVAN) 0.5 MG tablet   Oral   Take 0.5 mg by mouth 2 (two) times daily.         Marland Kitchen LYSINE PO   Oral   Take 1 tablet by mouth daily as needed (fever blisters).         . metFORMIN (GLUCOPHAGE) 500 MG tablet   Oral   Take 1,000 mg by mouth daily before supper.         . metoprolol succinate (TOPROL-XL) 50 MG 24 hr tablet   Oral   Take 50 mg by mouth daily. Take with or immediately following a meal.         . rosuvastatin (CRESTOR)  5 MG tablet   Oral   Take 5 mg by mouth every 7 (seven) days. Monday         . warfarin (COUMADIN) 5 MG tablet   Oral   Take 5-7.5 mg by mouth daily. Pt to take 5 mg daily except on Monday and Wednesday pt take 7.5 mg tablet ( 1.5 tablet)         . ondansetron (ZOFRAN ODT) 4 MG disintegrating tablet   Oral   Take 1 tablet (4 mg total) by mouth every 8 (eight) hours as needed for nausea.   10 tablet   0   . promethazine (PHENERGAN) 25 MG tablet   Oral   Take 0.5 tablets (12.5 mg total) by mouth every 6 (six) hours as needed for nausea.   12 tablet   0     BP 119/80  Pulse 75  Temp(Src) 97.4 F (36.3 C) (Oral)  Resp 16  SpO2 100%  Physical Exam  Nursing note and vitals reviewed. Constitutional: He appears well-developed and well-nourished. No distress.  HENT:  Head: Normocephalic and atraumatic.  Mouth/Throat: Oropharynx is clear and moist. No oropharyngeal exudate.  Eyes: Conjunctivae and EOM are normal. Pupils are equal, round, and reactive to light. Right eye exhibits no discharge. Left eye  exhibits no discharge. No scleral icterus.  Neck: Normal range of motion. Neck supple. No JVD present. No thyromegaly present.  Cardiovascular: Normal rate, regular rhythm, normal heart sounds and intact distal pulses.  Exam reveals no gallop and no friction rub.   No murmur heard. Pulmonary/Chest: Effort normal and breath sounds normal. No respiratory distress. He has no wheezes. He has no rales.  Abdominal: Soft. Bowel sounds are normal. He exhibits no distension and no mass. There is tenderness ( Epigastric and left upper quadrant tenderness to palpation, no guarding, no masses, no peritoneal signs).  Musculoskeletal: Normal range of motion. He exhibits no edema and no tenderness.  Lymphadenopathy:    He has no cervical adenopathy.  Neurological: He is alert. Coordination normal.  Right-sided facial droop, baseline for patient  Skin: Skin is warm and dry. No rash noted. No erythema.  Psychiatric: He has a normal mood and affect. His behavior is normal.    ED Course  Procedures (including critical care time)  Labs Reviewed  CBC WITH DIFFERENTIAL - Abnormal; Notable for the following:    RBC 6.01 (*)    Hemoglobin 17.7 (*)    All other components within normal limits  COMPREHENSIVE METABOLIC PANEL - Abnormal; Notable for the following:    Potassium 3.4 (*)    Glucose, Bld 140 (*)    GFR calc non Af Amer 59 (*)    GFR calc Af Amer 68 (*)    All other components within normal limits  LIPASE, BLOOD  URINALYSIS, ROUTINE W REFLEX MICROSCOPIC   Dg Chest 2 View  02/14/2013  *RADIOLOGY REPORT*  Clinical Data: Weight loss and lower chest discomfort.  CHEST - 2 VIEW  Comparison: 02/10/2013  Findings: There is chronic elevation of the right hemidiaphragm. Otherwise, the lungs are clear. Heart and mediastinum are within normal limits.  Trachea is midline and the bony thorax is intact.  IMPRESSION: Stable chest radiograph findings.  No acute cardiopulmonary disease.   Original Report  Authenticated By: Richarda Overlie, M.D.    Ct Abdomen Pelvis W Contrast  02/14/2013  *RADIOLOGY REPORT*  Clinical Data: Pain and weight loss.  CT ABDOMEN AND PELVIS WITH CONTRAST  Technique:  Multidetector CT imaging  of the abdomen and pelvis was performed following the standard protocol during bolus administration of intravenous contrast.  Contrast: OMNIPAQUE IOHEXOL 300 MG/ML  SOLN  Comparison: None.  Findings: The lung bases are clear.  There is no evidence for free intraperitoneal air.  Elevation of the right hemidiaphragm.  6 mm low density structure in the right hepatic lobe on image 29.  Gallbladder has been removed.  The portal venous system is patent.  Normal appearance of the pancreas, spleen and adrenal glands.  Low density structures in the kidneys are suggestive for cysts. Largest cyst measures 1.2 cm in the left kidney upper pole.  There is focal cortical scarring along the posterior left kidney mid pole.  Inguinal hernias containing fat, right side greater than left.  Prostate is enlarged and the seminal vesicles are also enlarged and nodular.  Prostate measures 7.4 cm in transverse day with a few calcifications.  Mild wall thickening of the sigmoid colon be related to diverticulosis.  There are diverticula within the sigmoid colon.  No evidence for acute colonic inflammation.  There is an abnormal structure just below the aortic bifurcation and adjacent to the left common iliac vein.  This structure measures 2.2 x 2.4 x 2.8 cm.  Findings are concerning for a soft tissue mass or lymphadenopathy.  However, the density is very similar to the adjacent venous structures and a venous varix cannot be completely excluded.  Otherwise, no significant retroperitoneal lymphadenopathy.  Degenerative facet changes in the lower lumbar spine.  No acute bony abnormality.  There is concern for focal wall thickening at the cecum.  This is best seen on image 24, sequence #6.  The wall thickening appears to be  separate from the ileocecal valve.  IMPRESSION: The prostate is markedly enlarged and there is also enlargement of the seminal vesicles.  Reportedly, the patient has an elevated PSA level which raises concern for prostate cancer or BPH.  There is unusual soft tissue near the aortic bifurcation and adjacent to the left iliac vein.  Based on the appearance of the prostate, a soft tissue mass or nodal mass is a primary concern. The differential diagnosis also includes a paraganglioma or pheochromocytoma associated with the Organ of Zuckerkandl.  An unusual varix is also in the differential.  This area could be further characterized with MRI.  Concern for focal wall thickening in the cecum and recommend GI consultation for follow-up.  Probable hepatic and renal cysts.  Scarring involving the left kidney.  These results were called by telephone on 02/14/2013 at 10:11 a.m. to Dr. Hyacinth Meeker, who verbally acknowledged these results.   Original Report Authenticated By: Richarda Overlie, M.D.      1. Abdominal pain   2. Weight loss   3. Prostatic hypertrophy       MDM  Vital signs are normal, the patient does have risk factor for development of cancer. He has had weight loss and upper abdominal pain which is consistent for this. Would consider different sources including colon, pancreatic, CT scan pending, labs pending, IV fluids.  Care was discussed with Dr. Selena Batten the patient's primary Dr., the patient was informed of all of his results. There is no obvious etiology for the patient's symptoms including abdominal pain, weight loss or nausea. His vitals are normal, he does appear to have some dehydration, I have discussed the CT scan with the radiologist, several findings informed the patient, informed Dr. Selena Batten, patient stable for discharge  Meds given in ED:  Medications  0.9 %  sodium chloride infusion (not administered)  sodium chloride 0.9 % bolus 1,000 mL (not administered)  iohexol (OMNIPAQUE) 300 MG/ML solution  50 mL (50 mLs Oral Contrast Given 02/14/13 0827)  iohexol (OMNIPAQUE) 300 MG/ML solution 100 mL (100 mLs Intravenous Contrast Given 02/14/13 0929)    New Prescriptions   ONDANSETRON (ZOFRAN ODT) 4 MG DISINTEGRATING TABLET    Take 1 tablet (4 mg total) by mouth every 8 (eight) hours as needed for nausea.   PROMETHAZINE (PHENERGAN) 25 MG TABLET    Take 0.5 tablets (12.5 mg total) by mouth every 6 (six) hours as needed for nausea.      Vida Roller, MD 02/14/13 1036

## 2013-02-14 NOTE — ED Notes (Signed)
Pt states he has had weight loss, loss of appetite and nausea which progressed to abd pain in LUQ.  Denies increase of pain with eating.  Denies v/d, blood in stool.  Has seen gastroenterologist, was scheduled for CT today, but pain was too much.

## 2013-02-18 ENCOUNTER — Other Ambulatory Visit: Payer: Self-pay | Admitting: Internal Medicine

## 2013-02-18 DIAGNOSIS — R1900 Intra-abdominal and pelvic swelling, mass and lump, unspecified site: Secondary | ICD-10-CM

## 2013-02-21 ENCOUNTER — Ambulatory Visit
Admission: RE | Admit: 2013-02-21 | Discharge: 2013-02-21 | Disposition: A | Discharge status: Home / Self Care | Payer: Federal, State, Local not specified - PPO | Source: Ambulatory Visit | Attending: Internal Medicine | Admitting: Internal Medicine

## 2013-02-21 DIAGNOSIS — R1900 Intra-abdominal and pelvic swelling, mass and lump, unspecified site: Secondary | ICD-10-CM

## 2013-02-21 MED ORDER — GADOBENATE DIMEGLUMINE 529 MG/ML IV SOLN
16.0000 mL | Freq: Once | INTRAVENOUS | Status: AC | PRN
  Administered 2013-02-21: 16 mL via INTRAVENOUS

## 2013-02-23 ENCOUNTER — Other Ambulatory Visit: Payer: Federal, State, Local not specified - PPO

## 2013-03-02 ENCOUNTER — Ambulatory Visit
Admission: RE | Admit: 2013-03-02 | Discharge: 2013-03-02 | Disposition: A | Discharge status: Home / Self Care | Payer: Medicare Other | Source: Ambulatory Visit | Attending: Internal Medicine | Admitting: Internal Medicine

## 2013-03-02 DIAGNOSIS — R1900 Intra-abdominal and pelvic swelling, mass and lump, unspecified site: Secondary | ICD-10-CM

## 2013-03-02 MED ORDER — IOHEXOL 300 MG/ML  SOLN
75.0000 mL | Freq: Once | INTRAMUSCULAR | Status: AC | PRN
  Administered 2013-03-02: 75 mL via INTRAVENOUS

## 2013-03-07 ENCOUNTER — Telehealth (INDEPENDENT_AMBULATORY_CARE_PROVIDER_SITE_OTHER): Payer: Self-pay

## 2013-03-07 ENCOUNTER — Ambulatory Visit (INDEPENDENT_AMBULATORY_CARE_PROVIDER_SITE_OTHER): Payer: Medicare Other | Admitting: General Surgery

## 2013-03-07 ENCOUNTER — Encounter (INDEPENDENT_AMBULATORY_CARE_PROVIDER_SITE_OTHER): Payer: Self-pay | Admitting: General Surgery

## 2013-03-07 VITALS — BP 110/70 | HR 74 | Temp 96.0°F | Resp 18 | Ht 72.0 in | Wt 172.0 lb

## 2013-03-07 DIAGNOSIS — R1909 Other intra-abdominal and pelvic swelling, mass and lump: Secondary | ICD-10-CM

## 2013-03-07 NOTE — Progress Notes (Signed)
Chief Complaint  Patient presents with  . 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  He states this is in going on for around 3 weeks.  He was scheduled for a CT scan from Dr. Kim, but the abdominal pain got so bad that he went to the emergency department. He received a CT scan there which demonstrated several findings. He had some cecal wall thickening, prostate enlargement, and a mass at the bifurcation of the aorta.  He has seen Dr. Ottelin at [facility]. His PSA was checked and was only 5.6. Dr. Ottelin felt that this was likely physiologic and because of his age that he would not do a prostate biopsy at this time. He is getting this rechecked in 6 months.  He also saw Dr. Outlaw. He did have a colonoscopy last September. Dr. Outlaw felt that he did not need another colonoscopy.  He saw Dr. Kohut from [facility] and has collected a 24-hour urine for metanephrines.  He is supposed to get results of this today. He states that the pain has diminished somewhat, however he continues to have significant difficulty eating. He feels like he has to force it down.  Past Medical History  Diagnosis Date  . Atrial fibrillation   . Diabetes mellitus without complication   . Hypertension   . Acoustic neuroma   . 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 Hernia     Past Surgical History  Procedure Laterality Date  . Brain surgery      Current Outpatient Prescriptions  Medication Sig Dispense Refill  . amLODipine (NORVASC) 5 MG tablet Take 2.5 mg by mouth daily.      . buPROPion (WELLBUTRIN) 100 MG tablet Take 100 mg by mouth daily.      . diltiazem (CARDIZEM CD) 240 MG 24 hr capsule Take 240 mg by mouth daily.      . fenofibrate (TRICOR) 145 MG tablet Take 145 mg by mouth daily.      . fish oil-omega-3 fatty acids 1000 MG capsule Take 2 g by mouth daily.      . LORazepam (ATIVAN) 0.5 MG tablet  Take 0.5 mg by mouth 2 (two) times daily.      . LYSINE PO Take 1 tablet by mouth daily as needed (fever blisters).      . metFORMIN (GLUCOPHAGE) 500 MG tablet Take 1,000 mg by mouth daily before supper.      . metoprolol succinate (TOPROL-XL) 50 MG 24 hr tablet Take 50 mg by mouth daily. Take with or immediately following a meal.      . omeprazole (PRILOSEC) 20 MG capsule       . ondansetron (ZOFRAN ODT) 4 MG disintegrating tablet Take 1 tablet (4 mg total) by mouth every 8 (eight) hours as needed for nausea.  10 tablet  0  . promethazine (PHENERGAN) 25 MG tablet Take 0.5 tablets (12.5 mg total) by mouth every 6 (six) hours as needed for nausea.  12 tablet  0  . rosuvastatin (CRESTOR) 5 MG tablet Take 5 mg by mouth every 7 (seven) days. Monday      . traMADol (ULTRAM) 50 MG tablet       . warfarin (COUMADIN) 5 MG tablet Take 5-7.5 mg by mouth daily. Pt to take 5 mg daily except on Monday and Wednesday pt take 7.5 mg tablet ( 1.5   tablet)      . zolpidem (AMBIEN) 10 MG tablet        No current facility-administered medications for this visit.     No Known Allergies   History reviewed. No pertinent family history.   History   Social History  . Marital Status: Married    Spouse Name: N/A    Number of Children: N/A  . Years of Education: N/A   Social History Main Topics  . Smoking status: Never Smoker   . Smokeless tobacco: Never Used  . Alcohol Use: No  . Drug Use: No  . Sexually Active: None   Other Topics Concern  . None   Social History Narrative  . None     REVIEW OF SYSTEMS - PERTINENT POSITIVES ONLY: 12 point review of systems negative other than HPI and PMH except for weight loss  EXAM: Filed Vitals:   03/07/13 0928  BP: 110/70  Pulse: 74  Temp: 96 F (35.6 C)  Resp: 18   Filed Weights   03/07/13 0928  Weight: 172 lb (78.019 kg)     Gen:  No acute distress.  Well nourished and well groomed.   Neurological: Alert and oriented to person, place, and  time. Facial droop on right as sequelae from resection of right acoustic neuroma.  Also, right eyelid droop.   Head: Normocephalic and atraumatic.  Eyes: Conjunctivae are normal. Pupils are equal, round, and reactive to light. No scleral icterus.  Neck: Normal range of motion. Neck supple. No tracheal deviation or thyromegaly present.  Cardiovascular: Normal rate, regular rhythm, normal heart sounds and intact distal pulses.  Exam reveals no gallop and no friction rub.  No murmur heard. Respiratory: Effort normal.  No respiratory distress. No chest wall tenderness. Breath sounds normal.  No wheezes, rales or rhonchi.  GI: Soft. Bowel sounds are normal. The abdomen is soft and nontender.  There is right subcostal incision.  There is no rebound and no guarding.  Musculoskeletal: Normal range of motion. Extremities are nontender.  Lymphadenopathy: No cervical, preauricular, postauricular or axillary adenopathy is present Skin: Skin is warm and dry. No rash noted. No diaphoresis. No erythema. No pallor. No clubbing, cyanosis, or edema.   Psychiatric: Normal mood and affect. Behavior is normal. Judgment and thought content normal.    LABORATORY RESULTS: Available labs are reviewed  CBC with elevated HCT 50.5, plts 229k CMET with nomral results.  Hgb A1C 6.4  RADIOLOGY RESULTS: See E-Chart or I-Site for most recent results.  Images and reports are reviewed. CT abd/pelvis IMPRESSION:  The prostate is markedly enlarged and there is also enlargement of  the seminal vesicles. Reportedly, the patient has an elevated PSA  level which raises concern for prostate cancer or BPH.  There is unusual soft tissue near the aortic bifurcation and  adjacent to the left iliac vein. Based on the appearance of the  prostate, a soft tissue mass or nodal mass is a primary concern.  The differential diagnosis also includes a paraganglioma or  pheochromocytoma associated with the Organ of Zuckerkandl. An  unusual  varix is also in the differential. This area could be  further characterized with MRI.  Concern for focal wall thickening in the cecum and recommend GI  consultation for follow-up.  Probable hepatic and renal cysts.  Scarring involving the left kidney.   MR pelvis IMPRESSION:  1. 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 Clinical correlation for signs and symptoms of extra-  adrenal paraganglioma are recommended.    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  If metanephrines are high, he will need alpha blockade prior to surgery.  He will need to hold coumadin for 5 days.  We discussed the risk of bleeding especially because of proximity to iliac vein and artery. I discussed the risk of injury to ureters and nerve plexus.  We will plan to do this as soon as possible.    I am waiting for labs from Dr. Walter Kohut and to discuss coumadin with Brian Bray, pharmacist at [facility].     Avir Deruiter L Vitalia Stough MD Surgical Oncology, General and Endocrine Surgery [facility]      Visit Diagnoses: 1. Abdominal or pelvic swelling, mass, or lump, other specified site     Primary Care Physician: KIM, Hal, MD    

## 2013-03-07 NOTE — Patient Instructions (Signed)
Main risks are bleeding, infection, damage to adjacent structures, wound complications such as hernia.

## 2013-03-07 NOTE — Assessment & Plan Note (Signed)
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  If metanephrines are high, he will need alpha blockade prior to surgery.  He will need to hold coumadin for 5 days.  We discussed the risk of bleeding especially because of proximity to iliac vein and artery. I discussed the risk of injury to ureters and nerve plexus.  We will plan to do this as soon as possible.

## 2013-03-07 NOTE — Telephone Encounter (Signed)
Pt's MD returned call to Dr. Donell Beers.  The pt will need a Lovenox 40mg  bridge prior to stopping his coumadin before surgery on 03/31/13.  Dr. Jason Fila will fax documentation note to Dr. Donell Beers.

## 2013-03-11 ENCOUNTER — Encounter (INDEPENDENT_AMBULATORY_CARE_PROVIDER_SITE_OTHER): Payer: Self-pay

## 2013-03-16 ENCOUNTER — Telehealth (INDEPENDENT_AMBULATORY_CARE_PROVIDER_SITE_OTHER): Payer: Self-pay

## 2013-03-16 NOTE — Telephone Encounter (Signed)
Pt called stating he is having to continue to reduce his right inguinal hernia. He states it is very uncomfortable when it bulges out but so far he has been able to reduce the hernia. Pt wants to know if Dr Donell Beers would consider repairing hernia at time she does abd surgery on 03-31-13. Due to frequency the hernia is coming out pt is concerned he will not be able to continue to reduce hernia. Pt advised I will send this request to Dr Donell Beers. Pt can be reached at [phone].

## 2013-03-17 NOTE — Telephone Encounter (Signed)
Error

## 2013-03-21 ENCOUNTER — Encounter (HOSPITAL_COMMUNITY): Payer: Self-pay | Admitting: Pharmacy Technician

## 2013-03-23 ENCOUNTER — Encounter (HOSPITAL_COMMUNITY)
Admission: RE | Admit: 2013-03-23 | Discharge: 2013-03-23 | Disposition: A | Discharge status: Home / Self Care | Payer: Medicare Other | Source: Ambulatory Visit | Attending: General Surgery | Admitting: General Surgery

## 2013-03-23 ENCOUNTER — Encounter (HOSPITAL_COMMUNITY): Payer: Self-pay

## 2013-03-23 ENCOUNTER — Telehealth (INDEPENDENT_AMBULATORY_CARE_PROVIDER_SITE_OTHER): Payer: Self-pay

## 2013-03-23 HISTORY — DX: Anxiety disorder, unspecified: F41.9

## 2013-03-23 LAB — CBC
HCT: 48.8 % (ref 39.0–52.0)
Hemoglobin: 17.6 g/dL — ABNORMAL HIGH (ref 13.0–17.0)
MCH: 30.2 pg (ref 26.0–34.0)
MCV: 83.8 fL (ref 78.0–100.0)
RBC: 5.82 MIL/uL — ABNORMAL HIGH (ref 4.22–5.81)

## 2013-03-23 LAB — BASIC METABOLIC PANEL
CO2: 30 mEq/L (ref 19–32)
Chloride: 103 mEq/L (ref 96–112)
Glucose, Bld: 113 mg/dL — ABNORMAL HIGH (ref 70–99)
Potassium: 4.2 mEq/L (ref 3.5–5.1)
Sodium: 141 mEq/L (ref 135–145)

## 2013-03-23 LAB — ABO/RH: ABO/RH(D): A POS

## 2013-03-23 LAB — TYPE AND SCREEN
ABO/RH(D): A POS
Antibody Screen: NEGATIVE

## 2013-03-23 LAB — PROTIME-INR: Prothrombin Time: 20.5 seconds — ABNORMAL HIGH (ref 11.6–15.2)

## 2013-03-23 NOTE — Progress Notes (Signed)
req'd recent notes from dr Jacinto Halim, any tests. Called office re: patient states wants bil ing hernia repair also , stated discussed with dr Donell Beers but no clear answer. I spoke with sarah at office. To speak with dr Donell Beers and call back.

## 2013-03-23 NOTE — Pre-Procedure Instructions (Addendum)
FREMONT SKALICKY  03/23/2013   Your procedure is scheduled on:  03/31/13  Report to [facility] at 1000 AM.  Call this number if you have problems the morning of surgery: [phone]   Remember:   Do not eat food or drink liquids after midnight.   Take these medicines the morning of surgery with A SIP OF WATER: xanax, amlodipine, wellbutrin,diltiazem,metoprolol, omeprazole, zofran if needed, pain med STOP fish oil, coumadin per dr   Drucilla Schmidt not wear jewelry, make-up or nail polish.  Do not wear lotions, powders, or perfumes. You may wear deodorant.  Do not shave 48 hours prior to surgery. Men may shave face and neck.  Do not bring valuables to the hospital.  [facility] is not responsible                   for any belongings or valuables.  Contacts, dentures or bridgework may not be worn into surgery.  Leave suitcase in the car. After surgery it may be brought to your room.  For patients admitted to the hospital, checkout time is 11:00 AM the day of  discharge.   Patients discharged the day of surgery will not be allowed to drive  home.  Name and phone number of your driver:   Special Instructions: Shower using CHG 2 nights before surgery and the night before surgery.  If you shower the day of surgery use CHG.  Use special wash - you have one bottle of CHG for all showers.  You should use approximately 1/3 of the bottle for each shower.   Please read over the following fact sheets that you were given: Pain Booklet, Coughing and Deep Breathing, Blood Transfusion Information, MRSA Information and Surgical Site Infection Prevention

## 2013-03-23 NOTE — Telephone Encounter (Signed)
Joyce Gross from Short Stay at [facility] called regarding the surgery.  The patient is scheduled for resection of abdominal mass but he thought he may be getting bilateral hernia repair as well.  Please let her know.

## 2013-03-24 NOTE — Progress Notes (Addendum)
Anesthesia Chart Review:  Patient is a 72 year old male scheduled for resection of abdominal mass on 03/31/13 by Dr. Donell Beers.  Mass is felt to be either a paraganglioma vs pheochromocytoma vs lymph node mass.  (Patient is also waiting to hear from Dr. 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  PCP is listed as Dr. Pearson Grippe who has recommended a Lovenox bridge while he is off Coumadin.      Cardiologist in Dr. Jacinto Halim, last visit on 12/11/12 to get established.  Patient's afib was rate controlled and he was without CV/CHF symptoms.  Dr. Jacinto Halim did not recommend any further cardiac testing at that time unless he become symptomatic.  His notes mention a normal echocardiogram in 2011 and a cardiac cath in [place] in 1995.  Since he has known chronic afib with cardiology evaluation within the past four months without additional testing recommended then I do not think he will have to undergo further cardiology evaluation preoperatively if he has no change in his rhythm/rate or symptomology.   EKG on 03/23/13 showed afib @ 86 bpm, ST/T wave abnormality, consider ischemia.  He has had lateral T wave abnormality on EKGs dating back to 2002 (see Muse).   CXR on 02/14/13 showed chronic elevation of the right hemidiaphragm, lungs clear, heart WNL, trachea midline, no acute cardiopulmonary disease.  Preoperative labs noted.  Repeat PT/PTT on arrival.  Notes indicate that he has had a  24 hour urine metanephrines test in May ordered by Dr. Darci Needle.  [facility] does not have the results, so I've requested a copy of the results from [facility] and will follow-up when available.  If levels are high, he will need alpha blockade prior to surgery.    I've updated anesthesiologist Dr. Krista Blue of the above.  Velna Ochs [facility]/Anesthesiology Phone [phone] 03/24/2013 3:55 PM  Addendum: 03/30/13 1145 Dr. Donell Beers will not be performing bilateral IHR at the time of his surgery tomorrow.  I did communicate with her about patient's urine metanephrine test from May 2014.  Norepinephrine, PL was mildly elevated at 1026 (normal 0-874), epinephrine, PL and dopamine, PL were normal at 56 and 36, respectively.  Normetanephrine, U, 24hr and Metanephrine, U, 24hr were also WNL at 298 and 111, respectively.  She said positive would be if the tests were 2X greater than normal, which they weren't.  (Test results are scanned under Media tab, Encounter tab 03/29/13.) I updated Dr. Krista Blue.

## 2013-03-25 ENCOUNTER — Telehealth (INDEPENDENT_AMBULATORY_CARE_PROVIDER_SITE_OTHER): Payer: Self-pay | Admitting: General Surgery

## 2013-03-25 NOTE — Telephone Encounter (Signed)
Discussed that we would not be able to do BIH hernias at the time of surgery.  We could temporize by closing opening in abdomen, but this would not be permanent

## 2013-03-29 ENCOUNTER — Encounter (INDEPENDENT_AMBULATORY_CARE_PROVIDER_SITE_OTHER): Payer: Self-pay

## 2013-03-30 ENCOUNTER — Encounter (INDEPENDENT_AMBULATORY_CARE_PROVIDER_SITE_OTHER): Payer: Self-pay

## 2013-03-30 MED ORDER — CEFAZOLIN SODIUM-DEXTROSE 2-3 GM-% IV SOLR
2.0000 g | INTRAVENOUS | Status: AC
  Administered 2013-03-31: 2 g via INTRAVENOUS
  Filled 2013-03-30: qty 50

## 2013-03-30 MED ORDER — CHLORHEXIDINE GLUCONATE 4 % EX LIQD: 1.0000 "application " | Freq: Once | CUTANEOUS | Status: DC

## 2013-03-30 NOTE — Progress Notes (Signed)
Pt notified of time change;to arrive at 0915. 

## 2013-03-31 ENCOUNTER — Inpatient Hospital Stay (HOSPITAL_COMMUNITY): Payer: Medicare Other | Admitting: Vascular Surgery

## 2013-03-31 ENCOUNTER — Encounter (HOSPITAL_COMMUNITY): Payer: Self-pay | Admitting: General Surgery

## 2013-03-31 ENCOUNTER — Encounter (HOSPITAL_COMMUNITY)
Admission: RE | Disposition: A | Discharge status: Home / Self Care | Payer: Self-pay | Source: Ambulatory Visit | Attending: General Surgery

## 2013-03-31 ENCOUNTER — Encounter (HOSPITAL_COMMUNITY): Payer: Self-pay | Admitting: Vascular Surgery

## 2013-03-31 ENCOUNTER — Inpatient Hospital Stay (HOSPITAL_COMMUNITY)
Admission: RE | Admit: 2013-03-31 | Discharge: 2013-04-04 | DRG: 352 | Disposition: A | Discharge status: Home / Self Care | Payer: Medicare Other | Source: Ambulatory Visit | Attending: General Surgery | Admitting: General Surgery

## 2013-03-31 DIAGNOSIS — I10 Essential (primary) hypertension: Secondary | ICD-10-CM | POA: Diagnosis present

## 2013-03-31 DIAGNOSIS — Z01812 Encounter for preprocedural laboratory examination: Secondary | ICD-10-CM

## 2013-03-31 DIAGNOSIS — IMO0002 Reserved for concepts with insufficient information to code with codable children: Secondary | ICD-10-CM

## 2013-03-31 DIAGNOSIS — R634 Abnormal weight loss: Secondary | ICD-10-CM | POA: Diagnosis present

## 2013-03-31 DIAGNOSIS — R1909 Other intra-abdominal and pelvic swelling, mass and lump: Secondary | ICD-10-CM | POA: Diagnosis present

## 2013-03-31 DIAGNOSIS — R599 Enlarged lymph nodes, unspecified: Secondary | ICD-10-CM

## 2013-03-31 DIAGNOSIS — K4090 Unilateral inguinal hernia, without obstruction or gangrene, not specified as recurrent: Secondary | ICD-10-CM | POA: Diagnosis present

## 2013-03-31 DIAGNOSIS — R1900 Intra-abdominal and pelvic swelling, mass and lump, unspecified site: Principal | ICD-10-CM | POA: Diagnosis present

## 2013-03-31 DIAGNOSIS — R339 Retention of urine, unspecified: Secondary | ICD-10-CM | POA: Diagnosis not present

## 2013-03-31 DIAGNOSIS — Z79899 Other long term (current) drug therapy: Secondary | ICD-10-CM

## 2013-03-31 DIAGNOSIS — Z01810 Encounter for preprocedural cardiovascular examination: Secondary | ICD-10-CM

## 2013-03-31 DIAGNOSIS — I4891 Unspecified atrial fibrillation: Secondary | ICD-10-CM | POA: Diagnosis present

## 2013-03-31 DIAGNOSIS — Z7901 Long term (current) use of anticoagulants: Secondary | ICD-10-CM

## 2013-03-31 DIAGNOSIS — E119 Type 2 diabetes mellitus without complications: Secondary | ICD-10-CM | POA: Diagnosis present

## 2013-03-31 HISTORY — PX: LAPAROTOMY: SHX154

## 2013-03-31 HISTORY — PX: RETROPERITONEAL MASS EXCISION: SHX2342

## 2013-03-31 LAB — GLUCOSE, CAPILLARY
Glucose-Capillary: 127 mg/dL — ABNORMAL HIGH (ref 70–99)
Glucose-Capillary: 160 mg/dL — ABNORMAL HIGH (ref 70–99)

## 2013-03-31 LAB — CREATININE, SERUM
GFR calc Af Amer: >90 mL/min (ref >90)
GFR calc non Af Amer: 87 mL/min — ABNORMAL LOW (ref >90)

## 2013-03-31 LAB — CBC
HCT: 45 % (ref 39.0–52.0)
Hemoglobin: 16.6 g/dL (ref 13.0–17.0)
MCV: 82.9 fL (ref 78.0–100.0)
RDW: 13.4 % (ref 11.5–15.5)
WBC: 14.1 10*3/uL — ABNORMAL HIGH (ref 4.0–10.5)

## 2013-03-31 LAB — APTT: aPTT: 29 seconds (ref 24–37)

## 2013-03-31 SURGERY — LAPAROTOMY, EXPLORATORY
Anesthesia: General | Site: Abdomen | Wound class: Clean

## 2013-03-31 MED ORDER — ROCURONIUM BROMIDE 100 MG/10ML IV SOLN
INTRAVENOUS | Status: DC | PRN
  Administered 2013-03-31: 50 mg via INTRAVENOUS

## 2013-03-31 MED ORDER — 0.9 % SODIUM CHLORIDE (POUR BTL) OPTIME
TOPICAL | Status: DC | PRN
  Administered 2013-03-31: 2000 mL

## 2013-03-31 MED ORDER — PHENYLEPHRINE HCL 10 MG/ML IJ SOLN
10.0000 mg | INTRAVENOUS | Status: DC | PRN
  Administered 2013-03-31: 10 ug/min via INTRAVENOUS

## 2013-03-31 MED ORDER — ONDANSETRON HCL 4 MG PO TABS: 4.0000 mg | ORAL_TABLET | Freq: Four times a day (QID) | ORAL | Status: DC | PRN

## 2013-03-31 MED ORDER — LORAZEPAM 0.5 MG PO TABS
0.5000 mg | ORAL_TABLET | Freq: Two times a day (BID) | ORAL | Status: DC
  Administered 2013-03-31 – 2013-04-04 (×8): 0.5 mg via ORAL
  Filled 2013-03-31 (×8): qty 1

## 2013-03-31 MED ORDER — ONDANSETRON HCL 4 MG/2ML IJ SOLN: 4.0000 mg | Freq: Four times a day (QID) | INTRAMUSCULAR | Status: DC | PRN

## 2013-03-31 MED ORDER — CEFAZOLIN SODIUM 1-5 GM-% IV SOLN
1.0000 g | Freq: Four times a day (QID) | INTRAVENOUS | Status: AC
  Administered 2013-03-31 – 2013-04-01 (×3): 1 g via INTRAVENOUS
  Filled 2013-03-31 (×3): qty 50

## 2013-03-31 MED ORDER — DIPHENHYDRAMINE HCL 50 MG/ML IJ SOLN: 12.5000 mg | Freq: Four times a day (QID) | INTRAMUSCULAR | Status: DC | PRN

## 2013-03-31 MED ORDER — HYDROMORPHONE HCL PF 1 MG/ML IJ SOLN
INTRAMUSCULAR | Status: AC
  Filled 2013-03-31: qty 1

## 2013-03-31 MED ORDER — FENOFIBRATE 54 MG PO TABS
54.0000 mg | ORAL_TABLET | Freq: Every day | ORAL | Status: DC
  Administered 2013-03-31 – 2013-04-04 (×5): 54 mg via ORAL
  Filled 2013-03-31 (×5): qty 1

## 2013-03-31 MED ORDER — SODIUM CHLORIDE 0.9 % IJ SOLN: 9.0000 mL | INTRAMUSCULAR | Status: DC | PRN

## 2013-03-31 MED ORDER — ENOXAPARIN SODIUM 40 MG/0.4ML ~~LOC~~ SOLN
40.0000 mg | SUBCUTANEOUS | Status: DC
  Administered 2013-04-01 – 2013-04-04 (×4): 40 mg via SUBCUTANEOUS
  Filled 2013-03-31 (×4): qty 0.4

## 2013-03-31 MED ORDER — DILTIAZEM HCL ER COATED BEADS 240 MG PO CP24
240.0000 mg | ORAL_CAPSULE | Freq: Every day | ORAL | Status: DC
  Administered 2013-04-01 – 2013-04-04 (×4): 240 mg via ORAL
  Filled 2013-03-31 (×4): qty 1

## 2013-03-31 MED ORDER — PNEUMOCOCCAL VAC POLYVALENT 25 MCG/0.5ML IJ INJ
0.5000 mL | INJECTION | INTRAMUSCULAR | Status: AC
  Administered 2013-04-02: 0.5 mL via INTRAMUSCULAR
  Filled 2013-03-31: qty 0.5

## 2013-03-31 MED ORDER — ZOLPIDEM TARTRATE 5 MG PO TABS: 10.0000 mg | ORAL_TABLET | Freq: Every day | ORAL | Status: DC

## 2013-03-31 MED ORDER — NALOXONE HCL 0.4 MG/ML IJ SOLN: 0.4000 mg | INTRAMUSCULAR | Status: DC | PRN

## 2013-03-31 MED ORDER — GLYCOPYRROLATE 0.2 MG/ML IJ SOLN
INTRAMUSCULAR | Status: DC | PRN
  Administered 2013-03-31: 0.6 mg via INTRAVENOUS

## 2013-03-31 MED ORDER — ATORVASTATIN CALCIUM 10 MG PO TABS: 10.0000 mg | ORAL_TABLET | Freq: Every day | ORAL | Status: DC

## 2013-03-31 MED ORDER — PROPOFOL 10 MG/ML IV BOLUS
INTRAVENOUS | Status: DC | PRN
  Administered 2013-03-31: 160 mg via INTRAVENOUS

## 2013-03-31 MED ORDER — KCL IN DEXTROSE-NACL 20-5-0.45 MEQ/L-%-% IV SOLN
INTRAVENOUS | Status: DC
  Administered 2013-03-31: 19:00:00 via INTRAVENOUS
  Administered 2013-03-31: 1000 mL via INTRAVENOUS
  Administered 2013-04-01 – 2013-04-03 (×4): via INTRAVENOUS
  Filled 2013-03-31 (×8): qty 1000

## 2013-03-31 MED ORDER — MORPHINE SULFATE (PF) 1 MG/ML IV SOLN
INTRAVENOUS | Status: AC
  Filled 2013-03-31: qty 25

## 2013-03-31 MED ORDER — MORPHINE SULFATE 2 MG/ML IJ SOLN: 1.0000 mg | INTRAMUSCULAR | Status: DC | PRN

## 2013-03-31 MED ORDER — KCL IN DEXTROSE-NACL 20-5-0.45 MEQ/L-%-% IV SOLN
INTRAVENOUS | Status: AC
  Filled 2013-03-31: qty 1000

## 2013-03-31 MED ORDER — PROMETHAZINE HCL 25 MG PO TABS: 12.5000 mg | ORAL_TABLET | Freq: Four times a day (QID) | ORAL | Status: DC | PRN

## 2013-03-31 MED ORDER — LACTATED RINGERS IV SOLN
INTRAVENOUS | Status: DC
  Administered 2013-03-31: 10:00:00 via INTRAVENOUS

## 2013-03-31 MED ORDER — LACTATED RINGERS IV SOLN
INTRAVENOUS | Status: DC | PRN
  Administered 2013-03-31 (×2): via INTRAVENOUS

## 2013-03-31 MED ORDER — ACETAMINOPHEN 10 MG/ML IV SOLN
1000.0000 mg | Freq: Four times a day (QID) | INTRAVENOUS | Status: AC
  Administered 2013-03-31 – 2013-04-01 (×4): 1000 mg via INTRAVENOUS
  Filled 2013-03-31 (×5): qty 100

## 2013-03-31 MED ORDER — LIDOCAINE HCL (CARDIAC) 20 MG/ML IV SOLN
INTRAVENOUS | Status: DC | PRN
  Administered 2013-03-31: 90 mg via INTRAVENOUS

## 2013-03-31 MED ORDER — HEMOSTATIC AGENTS (NO CHARGE) OPTIME
TOPICAL | Status: DC | PRN
  Administered 2013-03-31: 1 via TOPICAL

## 2013-03-31 MED ORDER — ONDANSETRON 4 MG PO TBDP
4.0000 mg | ORAL_TABLET | Freq: Three times a day (TID) | ORAL | Status: DC | PRN
  Filled 2013-03-31: qty 1

## 2013-03-31 MED ORDER — FENTANYL CITRATE 0.05 MG/ML IJ SOLN
INTRAMUSCULAR | Status: DC | PRN
  Administered 2013-03-31 (×2): 100 ug via INTRAVENOUS
  Administered 2013-03-31: 150 ug via INTRAVENOUS
  Administered 2013-03-31: 100 ug via INTRAVENOUS

## 2013-03-31 MED ORDER — LACTATED RINGERS IV SOLN
INTRAVENOUS | Status: DC | PRN
  Administered 2013-03-31: 11:00:00 via INTRAVENOUS

## 2013-03-31 MED ORDER — BUPIVACAINE 0.25 % ON-Q PUMP DUAL CATH 300 ML
300.0000 mL | INJECTION | Status: AC
  Administered 2013-03-31: 300 mL
  Filled 2013-03-31: qty 300

## 2013-03-31 MED ORDER — AMLODIPINE BESYLATE 2.5 MG PO TABS
2.5000 mg | ORAL_TABLET | Freq: Every day | ORAL | Status: DC
  Administered 2013-04-01 – 2013-04-04 (×4): 2.5 mg via ORAL
  Filled 2013-03-31 (×4): qty 1

## 2013-03-31 MED ORDER — BUPROPION HCL 100 MG PO TABS
100.0000 mg | ORAL_TABLET | Freq: Every day | ORAL | Status: DC
  Administered 2013-04-01 – 2013-04-04 (×4): 100 mg via ORAL
  Filled 2013-03-31 (×4): qty 1

## 2013-03-31 MED ORDER — HYDROCODONE-ACETAMINOPHEN 5-325 MG PO TABS: 1.0000 | ORAL_TABLET | ORAL | Status: DC | PRN

## 2013-03-31 MED ORDER — ROSUVASTATIN CALCIUM 5 MG PO TABS
5.0000 mg | ORAL_TABLET | ORAL | Status: DC
  Administered 2013-04-04: 5 mg via ORAL
  Filled 2013-03-31: qty 1

## 2013-03-31 MED ORDER — BUPIVACAINE ON-Q PAIN PUMP (FOR ORDER SET NO CHG)
INJECTION | Status: DC
  Filled 2013-03-31: qty 1

## 2013-03-31 MED ORDER — ZOLPIDEM TARTRATE 5 MG PO TABS
5.0000 mg | ORAL_TABLET | Freq: Every day | ORAL | Status: DC
  Administered 2013-03-31 – 2013-04-03 (×4): 5 mg via ORAL
  Filled 2013-03-31 (×4): qty 1

## 2013-03-31 MED ORDER — METOPROLOL SUCCINATE ER 50 MG PO TB24
50.0000 mg | ORAL_TABLET | Freq: Every day | ORAL | Status: DC
Start: 2013-04-01 — End: 2013-04-04
  Administered 2013-04-01 – 2013-04-04 (×4): 50 mg via ORAL
  Filled 2013-03-31 (×4): qty 1

## 2013-03-31 MED ORDER — LACTATED RINGERS IV SOLN: INTRAVENOUS | Status: DC

## 2013-03-31 MED ORDER — MIDAZOLAM HCL 5 MG/5ML IJ SOLN
INTRAMUSCULAR | Status: DC | PRN
  Administered 2013-03-31: 1 mg via INTRAVENOUS

## 2013-03-31 MED ORDER — MORPHINE SULFATE (PF) 1 MG/ML IV SOLN
INTRAVENOUS | Status: DC
  Administered 2013-03-31: 30 mg via INTRAVENOUS
  Administered 2013-03-31: 18 mg via INTRAVENOUS
  Administered 2013-04-01: 11:00:00 via INTRAVENOUS
  Administered 2013-04-01: 3 mg via INTRAVENOUS
  Administered 2013-04-01 – 2013-04-02 (×2): 1.5 mg via INTRAVENOUS
  Administered 2013-04-02: 4.5 mg via INTRAVENOUS
  Filled 2013-03-31: qty 25

## 2013-03-31 MED ORDER — NEOSTIGMINE METHYLSULFATE 1 MG/ML IJ SOLN
INTRAMUSCULAR | Status: DC | PRN
  Administered 2013-03-31: 5 mg via INTRAVENOUS

## 2013-03-31 MED ORDER — HYDROMORPHONE HCL PF 1 MG/ML IJ SOLN
0.2500 mg | INTRAMUSCULAR | Status: DC | PRN
  Administered 2013-03-31 (×4): 0.5 mg via INTRAVENOUS

## 2013-03-31 MED ORDER — DIPHENHYDRAMINE HCL 12.5 MG/5ML PO ELIX: 12.5000 mg | ORAL_SOLUTION | Freq: Four times a day (QID) | ORAL | Status: DC | PRN

## 2013-03-31 MED ORDER — TRAMADOL HCL 50 MG PO TABS: 50.0000 mg | ORAL_TABLET | ORAL | Status: DC | PRN

## 2013-03-31 MED ORDER — INSULIN ASPART 100 UNIT/ML ~~LOC~~ SOLN
0.0000 [IU] | Freq: Three times a day (TID) | SUBCUTANEOUS | Status: DC
  Administered 2013-03-31: 2 [IU] via SUBCUTANEOUS
  Administered 2013-04-01: 1 [IU] via SUBCUTANEOUS
  Administered 2013-04-01 (×2): 2 [IU] via SUBCUTANEOUS
  Administered 2013-04-02 (×2): 1 [IU] via SUBCUTANEOUS
  Administered 2013-04-02: 2 [IU] via SUBCUTANEOUS
  Administered 2013-04-03 – 2013-04-04 (×4): 1 [IU] via SUBCUTANEOUS

## 2013-03-31 MED ORDER — ONDANSETRON HCL 4 MG/2ML IJ SOLN
INTRAMUSCULAR | Status: DC | PRN
  Administered 2013-03-31: 4 mg via INTRAVENOUS

## 2013-03-31 SURGICAL SUPPLY — 57 items
BLADE SURG ROTATE 9660 (MISCELLANEOUS) IMPLANT
CANISTER SUCTION 2500CC (MISCELLANEOUS) ×2 IMPLANT
CHLORAPREP W/TINT 26ML (MISCELLANEOUS) ×2 IMPLANT
CLIP TI LARGE 6 (CLIP) ×2 IMPLANT
CLIP TI MEDIUM 24 (CLIP) ×2 IMPLANT
CLOTH BEACON ORANGE TIMEOUT ST (SAFETY) ×2 IMPLANT
COVER SURGICAL LIGHT HANDLE (MISCELLANEOUS) ×2 IMPLANT
DRAPE LAPAROSCOPIC ABDOMINAL (DRAPES) ×2 IMPLANT
DRAPE UTILITY 15X26 W/TAPE STR (DRAPE) ×4 IMPLANT
DRAPE WARM FLUID 44X44 (DRAPE) ×2 IMPLANT
DRSG COVADERM 4X14 (GAUZE/BANDAGES/DRESSINGS) ×2 IMPLANT
ELECT BLADE 6.5 EXT (BLADE) ×2 IMPLANT
ELECT CAUTERY BLADE 6.4 (BLADE) ×2 IMPLANT
ELECT REM PT RETURN 9FT ADLT (ELECTROSURGICAL) ×2
ELECTRODE REM PT RTRN 9FT ADLT (ELECTROSURGICAL) ×1 IMPLANT
GLOVE BIO SURGEON STRL SZ 6 (GLOVE) ×2 IMPLANT
GLOVE BIOGEL M STRL SZ7.5 (GLOVE) ×2 IMPLANT
GLOVE BIOGEL PI IND STRL 6.5 (GLOVE) ×1 IMPLANT
GLOVE BIOGEL PI IND STRL 7.5 (GLOVE) ×1 IMPLANT
GLOVE BIOGEL PI IND STRL 8 (GLOVE) ×1 IMPLANT
GLOVE BIOGEL PI INDICATOR 6.5 (GLOVE) ×1
GLOVE BIOGEL PI INDICATOR 7.5 (GLOVE) ×1
GLOVE BIOGEL PI INDICATOR 8 (GLOVE) ×1
GLOVE SS BIOGEL STRL SZ 6.5 (GLOVE) ×2 IMPLANT
GLOVE SUPERSENSE BIOGEL SZ 6.5 (GLOVE) ×2
GLOVE SURG SS PI 7.0 STRL IVOR (GLOVE) ×2 IMPLANT
GOWN PREVENTION PLUS XXLARGE (GOWN DISPOSABLE) ×2 IMPLANT
GOWN STRL NON-REIN LRG LVL3 (GOWN DISPOSABLE) ×4 IMPLANT
HEMOSTAT SURGICEL 2X4 FIBR (HEMOSTASIS) ×2 IMPLANT
KIT BASIN OR (CUSTOM PROCEDURE TRAY) ×2 IMPLANT
KIT ROOM TURNOVER OR (KITS) ×2 IMPLANT
LIGASURE IMPACT 36 18CM CVD LR (INSTRUMENTS) IMPLANT
NS IRRIG 1000ML POUR BTL (IV SOLUTION) ×4 IMPLANT
PACK GENERAL/GYN (CUSTOM PROCEDURE TRAY) ×2 IMPLANT
PAD ARMBOARD 7.5X6 YLW CONV (MISCELLANEOUS) ×2 IMPLANT
SPECIMEN JAR LARGE (MISCELLANEOUS) IMPLANT
SPONGE LAP 18X18 X RAY DECT (DISPOSABLE) IMPLANT
STAPLER VISISTAT 35W (STAPLE) ×2 IMPLANT
SUCTION POOLE TIP (SUCTIONS) ×2 IMPLANT
SUT PDS AB 1 TP1 96 (SUTURE) ×2 IMPLANT
SUT PDS II 0 TP-1 LOOPED 60 (SUTURE) ×4 IMPLANT
SUT PROLENE 3 0 RB 1 (SUTURE) ×2 IMPLANT
SUT PROLENE 4 0 RB 1 (SUTURE) ×1
SUT PROLENE 4-0 RB1 .5 CRCL 36 (SUTURE) ×1 IMPLANT
SUT VIC AB 2-0 CTX 27 (SUTURE) ×2 IMPLANT
SUT VIC AB 2-0 SH 18 (SUTURE) ×2 IMPLANT
SUT VIC AB 2-0 SH 27 (SUTURE) ×1
SUT VIC AB 2-0 SH 27X BRD (SUTURE) ×1 IMPLANT
SUT VIC AB 3-0 SH 18 (SUTURE) ×2 IMPLANT
SUT VICRYL 4-0 PS2 18IN ABS (SUTURE) IMPLANT
SUT VICRYL AB 2 0 TIES (SUTURE) ×2 IMPLANT
SUT VICRYL AB 3 0 TIES (SUTURE) ×2 IMPLANT
TOWEL OR 17X24 6PK STRL BLUE (TOWEL DISPOSABLE) ×2 IMPLANT
TOWEL OR 17X26 10 PK STRL BLUE (TOWEL DISPOSABLE) ×2 IMPLANT
TRAY FOLEY CATH 14FRSI W/METER (CATHETERS) ×2 IMPLANT
WATER STERILE IRR 1000ML POUR (IV SOLUTION) IMPLANT
YANKAUER SUCT BULB TIP NO VENT (SUCTIONS) ×2 IMPLANT

## 2013-03-31 NOTE — Op Note (Signed)
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  FINDINGS:  4X2 cm mass, fleshy, soft.  Large direct inguinal hernia or right, numerous midline adhesions.    PROCEDURE:  The patient was identified in the holding area and taken to the operating room where he was placed supine on the operating room table. General anesthesia was induced. A Foley catheter was placed.  His abdomen was clipped, prepped, and draped in sterile fashion.  Timeout was performed according to the surgical safety checklist. When all was correct we continued.  A midline incision was made below the umbilicus. The cautery was used to incise the subcutaneous tissues. The fascia was opened in the midline with the cautery. The peritoneum was elevated and entered sharply. The fascia was incised the length of the incision. For the superior aspect of the incision, there was significant adhesion of the small bowel to the posterior abdominal wall. This was taken down sharply with Metzenbaum scissors.  There were a few small serosal defects and these were oversewn with 3-0 Vicryl interrupted sutures.  There was a large hernia defect visible in the right abdomen. The hernia defect was closed in pursestring fashion with a 2-0 Vicryl.  The Bookwalter retractor was placed to assist with visualization. The small bowel was packed into the upper abdomen with moist laparotomy sponge and the malleable retractors.  The mass was  palpable at the aortic bifurcation just ever the sacral promontory. The peritoneum was incised.  The mass was readily identified and the harmonic scalpel and Overholt were used to gently dissect the mass away from the vessels. There was one feeding vessel that required a metal clip.  Once the mass was removed, the abdomen was irrigated. Fibrillar hemostatic agent was placed in the retroperitoneum over the aortic bifurcation.  The peritoneum was closed back up over the aortic bifurcation. This was done with a running 2-0 Vicryl.  The abdomen was irrigated again.   The On-Q catheters were placed in the preperitoneal space on either side of the incision.  This was done with the blunt tunnelers.  The fascia was then closed using running #1 looped PDS suture. The skin was irrigated.  The skin was then closed using staples. The On-Q catheters were dancer the Tylenol wears in the tunnelers were removed. The wound was then cleaned, dried, and dressed with sterile soft dressings.  Needle, sponge, and instrument counts were correct x2. The patient was allowed to emerge from anesthesia and was taken to the PACU in stable condition.

## 2013-03-31 NOTE — Anesthesia Preprocedure Evaluation (Addendum)
Anesthesia Evaluation  Patient identified by MRN, date of birth, ID band Patient awake    Reviewed: Allergy & Precautions, H&P , NPO status , Patient's Chart, lab work & pertinent test results  Airway Mallampati: II      Dental   Pulmonary neg pulmonary ROS,  breath sounds clear to auscultation        Cardiovascular hypertension, Pt. on medications Rhythm:Regular Rate:Normal     Neuro/Psych    GI/Hepatic Neg liver ROS,   Endo/Other  diabetes  Renal/GU negative Renal ROS     Musculoskeletal   Abdominal   Peds  Hematology   Anesthesia Other Findings   Reproductive/Obstetrics                           Anesthesia Physical Anesthesia Plan  ASA: III  Anesthesia Plan: General   Post-op Pain Management:    Induction: Intravenous  Airway Management Planned: Oral ETT  Additional Equipment: Arterial line  Intra-op Plan:   Post-operative Plan: Possible Post-op intubation/ventilation  Informed Consent: I have reviewed the patients History and Physical, chart, labs and discussed the procedure including the risks, benefits and alternatives for the proposed anesthesia with the patient or authorized representative who has indicated his/her understanding and acceptance.   Dental advisory given  Plan Discussed with: CRNA, Anesthesiologist and Surgeon  Anesthesia Plan Comments:         Anesthesia Quick Evaluation

## 2013-03-31 NOTE — Interval H&P Note (Signed)
History and Physical Interval Note:  03/31/2013 10:19 AM  Tommy Weeks  has presented today for surgery, with the diagnosis of abdominal mass  The various methods of treatment have been discussed with the patient and family. After consideration of risks, benefits and other options for treatment, the patient has consented to  Procedure(s): Open resection of abdominal mass (N/A) as a surgical intervention .  The metanephrines were negative for pheochromocytoma.  The patient's history has been reviewed, patient examined, no change in status, stable for surgery.  I have reviewed the patient's chart and labs.  Questions were answered to the patient's satisfaction.     [name]

## 2013-03-31 NOTE — H&P (View-Only) (Signed)
Chief Complaint  Patient presents with  . 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  He states this is in going on for around 3 weeks.  He was scheduled for a CT scan from Dr. Selena Batten, but the abdominal pain got so bad that he went to the emergency department. He received a CT scan there which demonstrated several findings. He had some cecal wall thickening, prostate enlargement, and a mass at the bifurcation of the aorta.  He has seen Dr. Vernie Ammons at [facility] urology. His PSA was checked and was only 5.6. Dr. Vernie Ammons felt that this was likely physiologic and because of his age that he would not do a prostate biopsy at this time. He is getting this rechecked in 6 months.  He also saw Dr. Dulce Sellar. He did have a colonoscopy last September. Dr. Dulce Sellar felt that he did not need another colonoscopy.  He saw Dr. Juleen China from [facility] and has collected a 24-hour urine for metanephrines.  He is supposed to get results of this today. He states that the pain has diminished somewhat, however he continues to have significant difficulty eating. He feels like he has to force it down.  Past Medical History  Diagnosis Date  . Atrial fibrillation   . Diabetes mellitus without complication   . Hypertension   . Acoustic neuroma   . 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 Hernia     Past Surgical History  Procedure Laterality Date  . Brain surgery      Current Outpatient Prescriptions  Medication Sig Dispense Refill  . amLODipine (NORVASC) 5 MG tablet Take 2.5 mg by mouth daily.      Marland Kitchen buPROPion (WELLBUTRIN) 100 MG tablet Take 100 mg by mouth daily.      Marland Kitchen diltiazem (CARDIZEM CD) 240 MG 24 hr capsule Take 240 mg by mouth daily.      . fenofibrate (TRICOR) 145 MG tablet Take 145 mg by mouth daily.      . fish oil-omega-3 fatty acids 1000 MG capsule Take 2 g by mouth daily.      Marland Kitchen LORazepam (ATIVAN) 0.5 MG tablet  Take 0.5 mg by mouth 2 (two) times daily.      Marland Kitchen LYSINE PO Take 1 tablet by mouth daily as needed (fever blisters).      . metFORMIN (GLUCOPHAGE) 500 MG tablet Take 1,000 mg by mouth daily before supper.      . metoprolol succinate (TOPROL-XL) 50 MG 24 hr tablet Take 50 mg by mouth daily. Take with or immediately following a meal.      . omeprazole (PRILOSEC) 20 MG capsule       . ondansetron (ZOFRAN ODT) 4 MG disintegrating tablet Take 1 tablet (4 mg total) by mouth every 8 (eight) hours as needed for nausea.  10 tablet  0  . promethazine (PHENERGAN) 25 MG tablet Take 0.5 tablets (12.5 mg total) by mouth every 6 (six) hours as needed for nausea.  12 tablet  0  . rosuvastatin (CRESTOR) 5 MG tablet Take 5 mg by mouth every 7 (seven) days. Monday      . traMADol (ULTRAM) 50 MG tablet       . warfarin (COUMADIN) 5 MG tablet Take 5-7.5 mg by mouth daily. Pt to take 5 mg daily except on Monday and Wednesday pt take 7.5 mg tablet ( 1.5  tablet)      . zolpidem (AMBIEN) 10 MG tablet        No current facility-administered medications for this visit.     No Known Allergies   History reviewed. No pertinent family history.   History   Social History  . Marital Status: Married    Spouse Name: N/A    Number of Children: N/A  . Years of Education: N/A   Social History Main Topics  . Smoking status: Never Smoker   . Smokeless tobacco: Never Used  . Alcohol Use: No  . Drug Use: No  . Sexually Active: None   Other Topics Concern  . None   Social History Narrative  . None     REVIEW OF SYSTEMS - PERTINENT POSITIVES ONLY: 12 point review of systems negative other than HPI and PMH except for weight loss  EXAM: Filed Vitals:   03/07/13 0928  BP: 110/70  Pulse: 74  Temp: 96 F (35.6 C)  Resp: 18   Filed Weights   03/07/13 0928  Weight: 172 lb (78.019 kg)     Gen:  No acute distress.  Well nourished and well groomed.   Neurological: Alert and oriented to person, place, and  time. Facial droop on right as sequelae from resection of right acoustic neuroma.  Also, right eyelid droop.   Head: Normocephalic and atraumatic.  Eyes: Conjunctivae are normal. Pupils are equal, round, and reactive to light. No scleral icterus.  Neck: Normal range of motion. Neck supple. No tracheal deviation or thyromegaly present.  Cardiovascular: Normal rate, regular rhythm, normal heart sounds and intact distal pulses.  Exam reveals no gallop and no friction rub.  No murmur heard. Respiratory: Effort normal.  No respiratory distress. No chest wall tenderness. Breath sounds normal.  No wheezes, rales or rhonchi.  GI: Soft. Bowel sounds are normal. The abdomen is soft and nontender.  There is right subcostal incision.  There is no rebound and no guarding.  Musculoskeletal: Normal range of motion. Extremities are nontender.  Lymphadenopathy: No cervical, preauricular, postauricular or axillary adenopathy is present Skin: Skin is warm and dry. No rash noted. No diaphoresis. No erythema. No pallor. No clubbing, cyanosis, or edema.   Psychiatric: Normal mood and affect. Behavior is normal. Judgment and thought content normal.    LABORATORY RESULTS: Available labs are reviewed  CBC with elevated HCT 50.5, plts 229k CMET with nomral results.  Hgb A1C 6.4  RADIOLOGY RESULTS: See E-Chart or I-Site for most recent results.  Images and reports are reviewed. CT abd/pelvis IMPRESSION:  The prostate is markedly enlarged and there is also enlargement of  the seminal vesicles. Reportedly, the patient has an elevated PSA  level which raises concern for prostate cancer or BPH.  There is unusual soft tissue near the aortic bifurcation and  adjacent to the left iliac vein. Based on the appearance of the  prostate, a soft tissue mass or nodal mass is a primary concern.  The differential diagnosis also includes a paraganglioma or  pheochromocytoma associated with the Organ of Zuckerkandl. An  unusual  varix is also in the differential. This area could be  further characterized with MRI.  Concern for focal wall thickening in the cecum and recommend GI  consultation for follow-up.  Probable hepatic and renal cysts.  Scarring involving the left kidney.   MR pelvis IMPRESSION:  1. 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 Clinical correlation for signs and symptoms of extra-  adrenal paraganglioma are recommended.    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  If metanephrines are high, he will need alpha blockade prior to surgery.  He will need to hold coumadin for 5 days.  We discussed the risk of bleeding especially because of proximity to iliac vein and artery. I discussed the risk of injury to ureters and nerve plexus.  We will plan to do this as soon as possible.    I am waiting for labs from Dr. Darci Needle and to discuss coumadin with Virgina Evener, pharmacist at [facility].     Maudry Diego MD Surgical Oncology, General and Endocrine Surgery [facility]      Visit Diagnoses: 1. Abdominal or pelvic swelling, mass, or lump, other specified site     Primary Care Physician: Pearson Grippe, MD

## 2013-03-31 NOTE — Anesthesia Procedure Notes (Signed)
Date/Time: 03/31/2013 11:17 AM Performed by: Coralee Rud Pre-anesthesia Checklist: Patient identified, Emergency Drugs available, Suction available and Patient being monitored Patient Re-evaluated:Patient Re-evaluated prior to inductionOxygen Delivery Method: Circle system utilized Preoxygenation: Pre-oxygenation with 100% oxygen Intubation Type: IV induction Ventilation: Mask ventilation without difficulty Laryngoscope Size: Miller and 2 Grade View: Grade II Tube size: 8.0 mm Number of attempts: 1 Airway Equipment and Method: Stylet Placement Confirmation: ETT inserted through vocal cords under direct vision and positive ETCO2 Secured at: 23 cm Tube secured with: Tape Dental Injury: Teeth and Oropharynx as per pre-operative assessment

## 2013-03-31 NOTE — Anesthesia Postprocedure Evaluation (Signed)
  Anesthesia Post-op Note  Patient: Tommy Weeks  Procedure(s) Performed: Procedure(s): Open resection of abdominal mass (N/A)  Patient Location: PACU  Anesthesia Type:General  Level of Consciousness: awake  Airway and Oxygen Therapy: Patient Spontanous Breathing  Post-op Pain: mild  Post-op Assessment: Post-op Vital signs reviewed  Post-op Vital Signs: Reviewed  Complications: No apparent anesthesia complications

## 2013-03-31 NOTE — Transfer of Care (Signed)
Immediate Anesthesia Transfer of Care Note  Patient: Tommy Weeks  Procedure(s) Performed: Procedure(s): Open resection of abdominal mass (N/A)  Patient Location: PACU  Anesthesia Type:General  Level of Consciousness: awake and sedated  Airway & Oxygen Therapy: Patient Spontanous Breathing and Patient connected to nasal cannula oxygen  Post-op Assessment: Report given to PACU RN, Post -op Vital signs reviewed and stable and Patient moving all extremities  Post vital signs: Reviewed and stable  Complications: No apparent anesthesia complications

## 2013-04-01 ENCOUNTER — Encounter (HOSPITAL_COMMUNITY): Payer: Self-pay | Admitting: General Practice

## 2013-04-01 LAB — GLUCOSE, CAPILLARY: Glucose-Capillary: 108 mg/dL — ABNORMAL HIGH (ref 70–99)

## 2013-04-01 LAB — BASIC METABOLIC PANEL
BUN: 7 mg/dL (ref 6–23)
CO2: 28 mEq/L (ref 19–32)
Chloride: 100 mEq/L (ref 96–112)
Creatinine, Ser: 0.68 mg/dL (ref 0.50–1.35)
Potassium: 3.4 mEq/L — ABNORMAL LOW (ref 3.5–5.1)

## 2013-04-01 LAB — CBC
HCT: 43.4 % (ref 39.0–52.0)
MCV: 83.5 fL (ref 78.0–100.0)
Platelets: 179 10*3/uL (ref 150–400)
RBC: 5.2 MIL/uL (ref 4.22–5.81)
WBC: 10.7 10*3/uL — ABNORMAL HIGH (ref 4.0–10.5)

## 2013-04-01 LAB — HEMOGLOBIN A1C: Mean Plasma Glucose: 126 mg/dL — ABNORMAL HIGH (ref <117)

## 2013-04-01 MED ORDER — ENSURE COMPLETE PO LIQD
237.0000 mL | Freq: Two times a day (BID) | ORAL | Status: DC
  Administered 2013-04-01 – 2013-04-04 (×3): 237 mL via ORAL

## 2013-04-01 MED ORDER — TAMSULOSIN HCL 0.4 MG PO CAPS
0.4000 mg | ORAL_CAPSULE | Freq: Every day | ORAL | Status: DC
  Administered 2013-04-01 – 2013-04-04 (×4): 0.4 mg via ORAL
  Filled 2013-04-01 (×4): qty 1

## 2013-04-01 MED ORDER — HYDROCODONE-ACETAMINOPHEN 5-325 MG PO TABS: 1.0000 | ORAL_TABLET | ORAL | Status: DC | PRN

## 2013-04-01 NOTE — Progress Notes (Signed)
ANTICOAGULATION CONSULT NOTE - Initial Consult  Pharmacy Consult for Warfarin (to start on 6/14) Indication: Hx Afib  No Known Allergies  Patient Measurements: Height: 6' (182.9 cm) Weight: 167 lb 9.6 oz (76.023 kg) IBW/kg (Calculated) : 77.6  Vital Signs: Temp: 98.4 F (36.9 C) (06/13 0951) Temp src: Oral (06/13 0235) BP: 167/86 mmHg (06/13 0951) Pulse Rate: 93 (06/13 0951)  Labs:  Recent Labs  03/31/13 0936 03/31/13 1854 04/01/13 0530  HGB  --  16.6 15.4  HCT  --  45.0 43.4  PLT  --  212 179  APTT 29  --   --   LABPROT 14.9  --   --   INR 1.19  --   --   CREATININE  --  0.78 0.68    Estimated Creatinine Clearance: 88.4 ml/min (by C-G formula based on Cr of 0.68).   Medical History: Past Medical History  Diagnosis Date  . Atrial fibrillation   . Diabetes mellitus without complication   . Hypertension   . Acoustic neuroma   . 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 Hernia   . Anxiety     Assessment: 72 y.o. M admitted for planned resection of retroperitoneal mass -- done on 6/12. PTA the patient was on warfarin for hx Afib, which was held prior to the surgery. INR on admit was 1.19. PTA the patient was known to be taking 5 mg daily EXCEPT for 7.5 mg on Mon/Wed. Pharmacy was consulted to resume warfarin dosing on 6/14 -- will go ahead and enter labs for now.  Currently the patient is on Enox 40 + SCDs for VTE prophylaxis, which will remain appropriate while waiting to resume warfarin.   Goal of Therapy:  INR 2-3 Monitor platelets by anticoagulation protocol: Yes   Plan:  1. No warfarin tonight -- will plan to start on 6/14 per [facility] 2. Daily PT/INR 3. Will follow up with PT/INR in the a.m to start warfarin doses  Georgina Pillion, PharmD, BCPS Clinical Pharmacist Pager: [phone] 04/01/2013 10:37 AM

## 2013-04-01 NOTE — Clinical Social Work Note (Signed)
Clinical Social Worker received referral for advance directive request. CSW reached out to Chaplain services to assist with this request. CSW will sign off, as social work intervention is no longer needed.   Rozetta Nunnery MSW, [employer] [phone]

## 2013-04-01 NOTE — Progress Notes (Signed)
UNABLE TO VOID, PT C/O DISCOMFORT, BLADDER SCANNED , CALL DR. Andrey Campanile, INSTRUCTED TO PLACE FOLEY AND LEAVE IN

## 2013-04-01 NOTE — Progress Notes (Signed)
1 Day Post-Op  Subjective: Out of bed already.  No n/v.  No flatus. No belching.  Abdomen feels "sore."  Objective: Vital signs in last 24 hours: Temp:  [98 F (36.7 C)-98.5 F (36.9 C)] 98.2 F (36.8 C) (06/13 0516) Pulse Rate:  [74-109] 88 (06/13 0516) Resp:  [12-20] 17 (06/13 0516) BP: (116-155)/(65-83) 155/76 mmHg (06/13 0516) SpO2:  [97 %-100 %] 99 % (06/13 0516) Arterial Line BP: (151-170)/(59-69) 151/59 mmHg (06/12 1500) Weight:  [167 lb 9.6 oz (76.023 kg)] 167 lb 9.6 oz (76.023 kg) (06/13 0700) Last BM Date: 03/29/13  Intake/Output from previous day: 06/12 0701 - 06/13 0700 In: 4655 [I.V.:4655] Out: 2700 [Urine:2650; Blood:50] Intake/Output this shift:    General appearance: alert, cooperative and no distress Resp: breathing comfortably Cardio: reg rhythm, sl tachy GI: soft, approp tender.  OnQ in place.  Non distended Extremities: extremities normal, atraumatic, no cyanosis or edema  Lab Results:   Recent Labs  03/31/13 1854 04/01/13 0530  WBC 14.1* 10.7*  HGB 16.6 15.4  HCT 45.0 43.4  PLT 212 179   BMET  Recent Labs  03/31/13 1854 04/01/13 0530  NA  --  136  K  --  3.4*  CL  --  100  CO2  --  28  GLUCOSE  --  140*  BUN  --  7  CREATININE 0.78 0.68  CALCIUM  --  9.0   PT/INR  Recent Labs  03/31/13 0936  LABPROT 14.9  INR 1.19   ABG No results found for this basename: PHART, PCO2, PO2, HCO3,  in the last 72 hours  Studies/Results: No results found.  Anti-infectives: Anti-infectives   Start     Dose/Rate Route Frequency Ordered Stop   03/31/13 1800  ceFAZolin (ANCEF) IVPB 1 g/50 mL premix     1 g 100 mL/hr over 30 Minutes Intravenous Every 6 hours 03/31/13 1721 04/01/13 0612   03/31/13 0600  ceFAZolin (ANCEF) IVPB 2 g/50 mL premix     2 g 100 mL/hr over 30 Minutes Intravenous On call to O.R. 03/30/13 1440 03/31/13 1115      Assessment/Plan: s/p Procedure(s): Open resection of abdominal mass (N/A) d/c foley Advance diet may  have urinary retention Restart coumadin tomorrow Cardizem for atrial fibrillation.   LOS: 1 day    [facility] 04/01/2013

## 2013-04-01 NOTE — Progress Notes (Signed)
INITIAL NUTRITION ASSESSMENT  Pt meets criteria for SEVERE MALNUTRITION in the context of chronic illness as evidenced by 13% weight loss x 3 months, severe muscle and fat wasting, and intake <75% of needs in > 1 month.   DOCUMENTATION CODES Per approved criteria  -Severe malnutrition in the context of chronic illness   INTERVENTION: Ensure Complete po BID, each supplement provides 350 kcal and 13 grams of protein.  NUTRITION DIAGNOSIS: Malnutrition related to poor appetite as evidenced by 13% weight loss x 3 months, severe muscle and fat wasting, and intake <75% of needs in > 1 month.   Goal: Pt to meet >/= 90% of their estimated nutrition needs.   Monitor:  PO intake, supplement acceptance, weight trend, labs  Reason for Assessment: Pt identified as at nutrition risk on the Malnutrition Screen Tool  72 y.o. male  Admitting Dx: Abdominal or pelvic swelling, mass, or lump, other specified site  ASSESSMENT: Pt admitted with abdominal pain. Now s/p resection of retroperitoneal mass (6/12), pathology pending.  Per pt he has lost a lot of weight in the last 3 months. Per pt he has had early satiety and decreased appetite. Breakfast is usually a small frozen sausage biscuit, lunch is 1/2 sandwich, and supper is a meal his wife cooks but he has been eating only 3-4 bites and is then full.   Nutrition Focused Physical Exam:  Subcutaneous Fat:  Orbital Region: WNL Upper Arm Region: WNL Thoracic and Lumbar Region: severe wasting  Muscle:  Temple Region: severe wasting Clavicle Bone Region: severe wasting Clavicle and Acromion Bone Region: mild-moderate wasting Scapular Bone Region: severe wasting Dorsal Hand: severe wasting Patellar Region: WNL Anterior Thigh Region: mild-moderate wasting Posterior Calf Region: WNL  Edema: not present   Height: Ht Readings from Last 1 Encounters:  04/01/13 6' (1.829 m)    Weight: Wt Readings from Last 1 Encounters:  04/01/13 167 lb 9.6  oz (76.023 kg)    Ideal Body Weight: 80.9 kg  % Ideal Body Weight: 94%  Wt Readings from Last 10 Encounters:  04/01/13 167 lb 9.6 oz (76.023 kg)  04/01/13 167 lb 9.6 oz (76.023 kg)  03/23/13 167 lb 9.6 oz (76.023 kg)  03/07/13 172 lb (78.019 kg)    Usual Body Weight: 192 lb   % Usual Body Weight: 87%  BMI:  Body mass index is 22.73 kg/(m^2).  Estimated Nutritional Needs: Kcal: 2000-2200 Protein: 100-120 grams Fluid: > 2 L/day  Skin: incision  Diet Order: Clear Liquid  EDUCATION NEEDS: -No education needs identified at this time   Intake/Output Summary (Last 24 hours) at 04/01/13 1535 Last data filed at 04/01/13 1400  Gross per 24 hour  Intake   1775 ml  Output   3800 ml  Net  -2025 ml    Last BM: 6/10   Labs:   Recent Labs Lab 03/31/13 1854 04/01/13 0530  NA  --  136  K  --  3.4*  CL  --  100  CO2  --  28  BUN  --  7  CREATININE 0.78 0.68  CALCIUM  --  9.0  GLUCOSE  --  140*    CBG (last 3)   Recent Labs  03/31/13 2209 04/01/13 0753 04/01/13 1152  GLUCAP 160* 146* 156*    Scheduled Meds: . amLODipine  2.5 mg Oral Daily  . buPROPion  100 mg Oral Daily  . diltiazem  240 mg Oral Daily  . enoxaparin (LOVENOX) injection  40 mg Subcutaneous Q24H  .  fenofibrate  54 mg Oral Daily  . insulin aspart  0-9 Units Subcutaneous TID WC  . LORazepam  0.5 mg Oral BID  . metoprolol succinate  50 mg Oral Daily  . morphine   Intravenous Q4H  . pneumococcal 23 valent vaccine  0.5 mL Intramuscular Tomorrow-1000  . [START ON 04/04/2013] rosuvastatin  5 mg Oral Q Mon  . tamsulosin  0.4 mg Oral Daily  . zolpidem  5 mg Oral QHS    Continuous Infusions: . bupivacaine ON-Q pain pump    . dextrose 5 % and 0.45 % NaCl with KCl 20 mEq/L 100 mL/hr at 04/01/13 0005  . lactated ringers 50 mL/hr at 03/31/13 1021    Past Medical History  Diagnosis Date  . Atrial fibrillation   . Diabetes mellitus without complication   . Hypertension   . Acoustic neuroma    . 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 Hernia   . Anxiety     Past Surgical History  Procedure Laterality Date  . Brain surgery  02    acoustic neuroma  . Cholecystectomy  90's  . Hernia repair Bilateral 90's    inguinal, umbilical  . Retroperitoneal mass excision  03/31/2013    Kendell Bane RD, LDN, CNSC [phone] Pager [phone] After Hours Pager

## 2013-04-01 NOTE — Progress Notes (Addendum)
UNABLE TO VOID AT THIS TIME, BLADDER SCANNED i&o CATH WITH RETURNED

## 2013-04-01 NOTE — Progress Notes (Signed)
Pt was alone, lights out, when I arrived. Talked briefly about adv dir, which he requested. He said he wanted a POA and I explained the adv dir is regarding his medical care only. He said he needed that also. His son was not present and he wishes to go over it when he is there. Left packet w/pt who will contact staff when he is ready to execute. Had brief visit and prayer w/pt. Pt was grateful for visit and prayer. Marjory Lies Chaplain  04/01/13 1600  Clinical Encounter Type  Visited With Patient

## 2013-04-02 LAB — GLUCOSE, CAPILLARY
Glucose-Capillary: 159 mg/dL — ABNORMAL HIGH (ref 70–99)
Glucose-Capillary: 121 mg/dL — ABNORMAL HIGH (ref 70–99)

## 2013-04-02 LAB — BASIC METABOLIC PANEL
BUN: 8 mg/dL (ref 6–23)
CO2: 29 mEq/L (ref 19–32)
Chloride: 101 mEq/L (ref 96–112)
Creatinine, Ser: 0.84 mg/dL (ref 0.50–1.35)
Glucose, Bld: 149 mg/dL — ABNORMAL HIGH (ref 70–99)

## 2013-04-02 LAB — CBC
HCT: 40.1 % (ref 39.0–52.0)
MCH: 29.6 pg (ref 26.0–34.0)
MCV: 84.8 fL (ref 78.0–100.0)
RBC: 4.73 MIL/uL (ref 4.22–5.81)
WBC: 8.6 10*3/uL (ref 4.0–10.5)

## 2013-04-02 MED ORDER — WARFARIN - PHARMACIST DOSING INPATIENT
Freq: Every day | Status: DC
  Administered 2013-04-02: 18:00:00

## 2013-04-02 MED ORDER — WARFARIN SODIUM 7.5 MG PO TABS
7.5000 mg | ORAL_TABLET | Freq: Once | ORAL | Status: AC
  Administered 2013-04-02: 7.5 mg via ORAL
  Filled 2013-04-02: qty 1

## 2013-04-02 NOTE — Progress Notes (Signed)
2 Days Post-Op  Subjective: No complaints.  Burping.  No flatus.  Objective: Vital signs in last 24 hours: Temp:  [98.2 F (36.8 C)-100 F (37.8 C)] 98.8 F (37.1 C) (06/14 0506) Pulse Rate:  [75-94] 75 (06/14 0506) Resp:  [15-20] 18 (06/14 0506) BP: (102-167)/(62-86) 102/62 mmHg (06/14 0506) SpO2:  [93 %-97 %] 94 % (06/14 0506) Last BM Date: 03/29/13  Intake/Output from previous day: 06/13 0701 - 06/14 0700 In: 1808 [P.O.:240; I.V.:1568] Out: 2950 [Urine:2950] Intake/Output this shift:    Incision/Wound:clean INTACT.  SOFT. LUNGS:  CTA CV: IRR   Lab Results:   Recent Labs  04/01/13 0530 04/02/13 0600  WBC 10.7* 8.6  HGB 15.4 14.0  HCT 43.4 40.1  PLT 179 164   BMET  Recent Labs  04/01/13 0530 04/02/13 0600  NA 136 136  K 3.4* 3.7  CL 100 101  CO2 28 29  GLUCOSE 140* 149*  BUN 7 8  CREATININE 0.68 0.84  CALCIUM 9.0 8.5   PT/INR  Recent Labs  03/31/13 0936 04/02/13 0600  LABPROT 14.9 16.0*  INR 1.19 1.31   ABG No results found for this basename: PHART, PCO2, PO2, HCO3,  in the last 72 hours  Studies/Results: No results found.  Anti-infectives: Anti-infectives   Start     Dose/Rate Route Frequency Ordered Stop   03/31/13 1800  ceFAZolin (ANCEF) IVPB 1 g/50 mL premix     1 g 100 mL/hr over 30 Minutes Intravenous Every 6 hours 03/31/13 1721 04/01/13 0612   03/31/13 0600  ceFAZolin (ANCEF) IVPB 2 g/50 mL premix     2 g 100 mL/hr over 30 Minutes Intravenous On call to O.R. 03/30/13 1440 03/31/13 1115      Assessment/Plan: s/p Procedure(s): Open resection of abdominal mass (N/A) await return of bowel function. Restart coumadin. OOB D/C pain pump Sunday.  LOS: 2 days    Tommy Weeks A. 04/02/2013

## 2013-04-02 NOTE — Progress Notes (Signed)
ANTICOAGULATION CONSULT NOTE - Follow Up Consult  Pharmacy Consult for Warfarin Indication: Hx Afib  No Known Allergies  Patient Measurements: Height: 6' (182.9 cm) Weight: 167 lb 9.6 oz (76.023 kg) IBW/kg (Calculated) : 77.6  Vital Signs: Temp: 98.8 F (37.1 C) (06/14 0506) Temp src: Oral (06/14 0506) BP: 102/62 mmHg (06/14 0506) Pulse Rate: 75 (06/14 0506)  Labs:  Recent Labs  03/31/13 0936  03/31/13 1854 04/01/13 0530 04/02/13 0600  HGB  --   < > 16.6 15.4 14.0  HCT  --   --  45.0 43.4 40.1  PLT  --   --  212 179 164  APTT 29  --   --   --   --   LABPROT 14.9  --   --   --  16.0*  INR 1.19  --   --   --  1.31  CREATININE  --   --  0.78 0.68 0.84  < > = values in this interval not displayed.  Estimated Creatinine Clearance: 84.2 ml/min (by C-G formula based on Cr of 0.84).   Assessment: 72 y.o. M admitted for planned resection of retroperitoneal mass -- done on 6/12. PTA the patient was on warfarin for hx Afib, which was held prior to the surgery. Pharmacy has been consulted to resume warfarin today. The patient is also concurrently on low-dose lovenox for VTE prophylaxis while INR <2. INR 1.31, Hgb/Hct/Plt wnl, no s/sx of bleeding noted.   The patient was re-educated on warfarin this admission.   Goal of Therapy:  INR 2-3   Plan:  1. Warfarin 7.5 mg x 1 dose at 1800 today 2. Will continue to monitor for any signs/symptoms of bleeding and will follow up with PT/INR in the a.m.   Georgina Pillion, PharmD, BCPS Clinical Pharmacist Pager: [phone] 04/02/2013 11:50 AM

## 2013-04-03 LAB — GLUCOSE, CAPILLARY
Glucose-Capillary: 126 mg/dL — ABNORMAL HIGH (ref 70–99)
Glucose-Capillary: 125 mg/dL — ABNORMAL HIGH (ref 70–99)
Glucose-Capillary: 127 mg/dL — ABNORMAL HIGH (ref 70–99)
Glucose-Capillary: 103 mg/dL — ABNORMAL HIGH (ref 70–99)

## 2013-04-03 LAB — PROTIME-INR: Prothrombin Time: 14.9 seconds (ref 11.6–15.2)

## 2013-04-03 MED ORDER — WARFARIN SODIUM 10 MG PO TABS
10.0000 mg | ORAL_TABLET | Freq: Once | ORAL | Status: AC
  Administered 2013-04-03: 10 mg via ORAL
  Filled 2013-04-03: qty 1

## 2013-04-03 NOTE — Progress Notes (Signed)
3 Days Post-Op  Subjective: Moving bowels.  Feels well  Objective: Vital signs in last 24 hours: Temp:  [97.6 F (36.4 C)-99.9 F (37.7 C)] 97.6 F (36.4 C) (06/15 0541) Pulse Rate:  [70-93] 93 (06/15 0541) Resp:  [15-18] 18 (06/15 0541) BP: (106-121)/(62-76) 114/74 mmHg (06/15 0541) SpO2:  [95 %-96 %] 96 % (06/15 0541) Last BM Date: 04/02/13  Intake/Output from previous day: 06/14 0701 - 06/15 0700 In: 2465 [P.O.:1200; I.V.:1265] Out: 1600 [Urine:1600] Intake/Output this shift:    Incision/Wound:C/D/I  Soft pain pumps in place  Lab Results:   Recent Labs  04/01/13 0530 04/02/13 0600  WBC 10.7* 8.6  HGB 15.4 14.0  HCT 43.4 40.1  PLT 179 164   BMET  Recent Labs  04/01/13 0530 04/02/13 0600  NA 136 136  K 3.4* 3.7  CL 100 101  CO2 28 29  GLUCOSE 140* 149*  BUN 7 8  CREATININE 0.68 0.84  CALCIUM 9.0 8.5   PT/INR  Recent Labs  04/02/13 0600 04/03/13 0440  LABPROT 16.0* 14.9  INR 1.31 1.19   ABG No results found for this basename: PHART, PCO2, PO2, HCO3,  in the last 72 hours  Studies/Results: No results found.  Anti-infectives: Anti-infectives   Start     Dose/Rate Route Frequency Ordered Stop   03/31/13 1800  ceFAZolin (ANCEF) IVPB 1 g/50 mL premix     1 g 100 mL/hr over 30 Minutes Intravenous Every 6 hours 03/31/13 1721 04/01/13 0612   03/31/13 0600  ceFAZolin (ANCEF) IVPB 2 g/50 mL premix     2 g 100 mL/hr over 30 Minutes Intravenous On call to O.R. 03/30/13 1440 03/31/13 1115      Assessment/Plan: s/p Procedure(s): Open resection of abdominal mass (N/A) Adv diet D/c foley On coumadin   LOS: 3 days    Cambryn Charters A. 04/03/2013

## 2013-04-03 NOTE — Progress Notes (Signed)
ANTICOAGULATION CONSULT NOTE - Follow Up Consult  Pharmacy Consult for Coumadin Indication: atrial fibrillation  No Known Allergies  Patient Measurements: Height: 6' (182.9 cm) Weight: 167 lb 9.6 oz (76.023 kg) IBW/kg (Calculated) : 77.6 Heparin Dosing Weight:   Vital Signs: Temp: 97.6 F (36.4 C) (06/15 0541) Temp src: Oral (06/15 0541) BP: 114/74 mmHg (06/15 0541) Pulse Rate: 93 (06/15 0541)  Labs:  Recent Labs  03/31/13 0936  03/31/13 1854 04/01/13 0530 04/02/13 0600 04/03/13 0440  HGB  --   < > 16.6 15.4 14.0  --   HCT  --   --  45.0 43.4 40.1  --   PLT  --   --  212 179 164  --   APTT 29  --   --   --   --   --   LABPROT 14.9  --   --   --  16.0* 14.9  INR 1.19  --   --   --  1.31 1.19  CREATININE  --   --  0.78 0.68 0.84  --   < > = values in this interval not displayed.  Estimated Creatinine Clearance: 84.2 ml/min (by C-G formula based on Cr of 0.84).   Assessment: Evaluation of abdominal mass -- r/o phenochromocytoma   Anticoagulation: SCDs + Enox 40 currently for VTE px. Warfarin for hx Afib to resume on 6/14 y. PTA dose was 5 mg daily EXCEPT 7.5 mg on Mon/Wed. Pt held doses x5 days prior to surgery. INR 1.19, Hgb/Hct/Plt wnl. No s/sx of bleeding noted.   Infectious Disease: Tmax 99.9, WBC wnl, on no abx  Cardiovascular: Hx HTN. VSS this am On norvasc, diltiazem CD, fenofibrate, toprol, crestor  Endocrinology: Hx DM. A1c 6 (03/31/13). CBGs/24h: 121-159, On sensitive SSI (PTA metformin held)  Gastrointestinal / Nutrition: POD#3 resection of retroperitoneal mass. +BM 6/14. Advance diet  Neurology: Hx facial droop (s/p brain surgery '02)/acoustic neuroma. Meds: Wellbutrin, Ativan  Nephrology: SCr 0.84, CrCl~ 80-90 ml/min. Lytes okay  Pulmonary: 96% on RA  Hematology / Oncology: Hgb/Hct/Plt wnl  PTA Medication Issues: fish oil, PPI  Goal of Therapy:  INR 2-3 Monitor platelets by anticoagulation protocol: Yes   Plan:  Lovenox 40mg /day untiil  INR>2 Coumadin 10mg  po x 1 tonight.  Merilynn Finland, [employer] 04/03/2013,8:54 AM

## 2013-04-04 DIAGNOSIS — R339 Retention of urine, unspecified: Secondary | ICD-10-CM | POA: Diagnosis not present

## 2013-04-04 LAB — GLUCOSE, CAPILLARY

## 2013-04-04 MED ORDER — WARFARIN SODIUM 10 MG PO TABS
10.0000 mg | ORAL_TABLET | Freq: Once | ORAL | Status: DC
  Filled 2013-04-04: qty 1

## 2013-04-04 MED ORDER — ENOXAPARIN SODIUM 40 MG/0.4ML ~~LOC~~ SOLN: 40.0000 mg | SUBCUTANEOUS | Status: DC

## 2013-04-04 NOTE — Progress Notes (Signed)
ANTICOAGULATION CONSULT NOTE - Follow Up Consult  Pharmacy Consult for Coumadin Indication: atrial fibrillation  No Known Allergies  Patient Measurements: Height: 6' (182.9 cm) Weight: 167 lb 9.6 oz (76.023 kg) IBW/kg (Calculated) : 77.6 Heparin Dosing Weight:   Vital Signs: Temp: 98.1 F (36.7 C) (06/16 0559) Temp src: Oral (06/16 0559) BP: 119/70 mmHg (06/16 0559) Pulse Rate: 86 (06/16 0559)  Labs:  Recent Labs  04/02/13 0600 04/03/13 0440 04/04/13 0547  HGB 14.0  --   --   HCT 40.1  --   --   PLT 164  --   --   LABPROT 16.0* 14.9 16.4*  INR 1.31 1.19 1.35  CREATININE 0.84  --   --     Estimated Creatinine Clearance: 84.2 ml/min (by C-G formula based on Cr of 0.84).   Assessment: Evaluation of abdominal mass -- r/o phenochromocytoma   Anticoagulation: SCDs + Enox 40 + Warfarin for hx Afib to resumed on 6/14 y. PTA dose was 5 mg daily EXCEPT 7.5 mg on Mon/Wed. Pt held doses x5 days prior to surgery. INR 1.35, Hgb/Hct/Plt wnl. No s/sx of bleeding noted.    Goal of Therapy:  INR 2-3 Monitor platelets by anticoagulation protocol: Yes   Plan:  Lovenox 40mg /day untiil INR>2 Coumadin 10mg  po x 1 again tonight and for a few days, then resume home regimen. MD plans discharge today.   Emanuele Mcwhirter S. Merilynn Finland, PharmD, BCPS Clinical Staff Pharmacist Pager [phone]  Misty Stanley Stillinger 04/04/2013,8:47 AM

## 2013-04-04 NOTE — Progress Notes (Signed)
4 Days Post-Op  Subjective: Doing well. No n/v. +bs. On reg food. Ambulating. Voided without issue.   Objective: Vital signs in last 24 hours: Temp:  [98.1 F (36.7 C)-99.1 F (37.3 C)] 98.1 F (36.7 C) (06/16 0559) Pulse Rate:  [80-88] 86 (06/16 0559) Resp:  [18] 18 (06/16 0559) BP: (95-119)/(66-75) 119/70 mmHg (06/16 0559) SpO2:  [95 %-97 %] 95 % (06/16 0559) Last BM Date: 04/03/13  Intake/Output from previous day: 06/15 0701 - 06/16 0700 In: 340 [P.O.:340] Out: 450 [Urine:450] Intake/Output this shift:    Alert, nad Facial droop (chronic) cta  Reg Soft, nd, nt. Dressing c/d/i  Lab Results:   Recent Labs  04/02/13 0600  WBC 8.6  HGB 14.0  HCT 40.1  PLT 164   BMET  Recent Labs  04/02/13 0600  NA 136  K 3.7  CL 101  CO2 29  GLUCOSE 149*  BUN 8  CREATININE 0.84  CALCIUM 8.5   PT/INR  Recent Labs  04/03/13 0440 04/04/13 0547  LABPROT 14.9 16.4*  INR 1.19 1.35   ABG No results found for this basename: PHART, PCO2, PO2, HCO3,  in the last 72 hours  Studies/Results: No results found.  Anti-infectives: Anti-infectives   Start     Dose/Rate Route Frequency Ordered Stop   03/31/13 1800  ceFAZolin (ANCEF) IVPB 1 g/50 mL premix     1 g 100 mL/hr over 30 Minutes Intravenous Every 6 hours 03/31/13 1721 04/01/13 0612   03/31/13 0600  ceFAZolin (ANCEF) IVPB 2 g/50 mL premix     2 g 100 mL/hr over 30 Minutes Intravenous On call to O.R. 03/30/13 1440 03/31/13 1115      Assessment/Plan: s/p Procedure(s): Open resection of abdominal mass (N/A)  D/c home later today Discussed with pt d/c instructions Discussed with pt need to f/u with his coumadin clinic at [facility], Tommy Weeks, Tommy Weeks for Dr Selena Batten this wed for INR check Will discuss with pharm whether or not pt will need lovenox bridge  Tommy Weeks. Andrey Campanile, MD, FACS General, Bariatric, & Minimally Invasive Surgery [facility], [place]   LOS: 4 days    Tommy Weeks 04/04/2013

## 2013-04-04 NOTE — Progress Notes (Signed)
Consult for pt to have Lovenox at home as bridge to Coumadin.  Spoke with pt.  He confirms that he took Lovenox prior to admission.  Wife gives him his injections and is aware of importance of rotating sites.  He is already set up with outside MD for his blood work and Coumadin mgmt. His [employer] supplement pays for his medicine and he only has a copay.  Will only need Rx at d/c and he can manage as before.

## 2013-04-04 NOTE — Discharge Summary (Signed)
Physician Discharge Summary  Tommy Weeks ZOX:[id number] DOB: 18-Dec-1940 DOA: 03/31/2013  PCP: Pearson Grippe, MD  Admit date: 03/31/2013 Discharge date: 04/04/2013  Recommendations for Outpatient Follow-up:   Follow-up Information   Follow up with [facility], MD On 04/26/2013. (10:15 AM)    Contact information:   [address] [place] [address] [phone]       Follow up with Pearson Grippe, MD. Schedule an appointment as soon as possible for a visit in 3 days. (with Arnetha Massy to check your INR/Coumadin level)    Contact information:   [address] [place] [address] [phone]      Discharge Diagnoses:  Patient Active Problem List   Diagnosis Date Noted  . Urinary retention 04/04/2013  . Atrial fibrillation 03/31/2013  . Diabetes mellitus 03/31/2013  . Hypertension 03/31/2013  . Abdominal or pelvic swelling, mass, or lump, other specified site 03/07/2013     Surgical Procedure: exploratory laparotomy, lysis of adhesions, resection of abdominal mass  Discharge Condition: good Disposition: home  Diet recommendation: cardiac fit  Filed Weights   04/01/13 0700  Weight: 167 lb 9.6 oz (76.023 kg)    Hospital Course:  The patient was admitted for a planned exploratory laparotomy to remove an abdominal mass. His postoperative course was uncomplicated. He did develop postoperative urinary retention and had to have his Foley replaced. He was started on Flomax. His Foley was removed on Sunday and he was able to void without difficulty afterwards. His diet was restarted and he was able to tolerate a regular diet. He had a bowel movement. His vital signs are stable. He was continued on Lovenox postoperatively for a bridge given his history of atrial fibrillation. His Coumadin was restarted. However he was not therapeutic. He was instructed that he would need to go out on a Lovenox bridge until he was therapeutic. He was instructed that he  would need to followup with [facility] mid week to have his INR checked. INR 1.35 on day of discharge. He had received 7.5 - 10 - 10 of coumadin while in the hospital.    Discharge Instructions  Discharge Orders   Future Appointments Provider Department Dept Phone   04/26/2013 10:15 AM Almond Lint, MD [facility], [place] [phone]   Future Orders Complete By Expires     Diet - low sodium heart healthy  As directed     Discharge instructions  As directed     Comments:      See [facility] abdominal surgery discharge instructions See Arnetha Massy Wednesday or Thursday for coumadin level check You need to inject 40mg  of Lovenox into your skin as taught on a daily basis until your INR is >2    Increase activity slowly  As directed         Medication List    TAKE these medications       amLODipine 5 MG tablet  Commonly known as:  NORVASC  Take 2.5 mg by mouth daily.     buPROPion 100 MG tablet  Commonly known as:  WELLBUTRIN  Take 100 mg by mouth daily.     diltiazem 240 MG 24 hr capsule  Commonly known as:  CARDIZEM CD  Take 240 mg by mouth daily.     enoxaparin 40 MG/0.4ML injection  Commonly known as:  LOVENOX  Inject 0.4 mLs (40 mg total) into the skin daily.     fenofibrate 145 MG tablet  Commonly known as:  TRICOR  Take 145 mg by mouth daily.     fish oil-omega-3 fatty acids 1000 MG capsule  Take 2 g by mouth daily.     HYDROcodone-acetaminophen 5-325 MG per tablet  Commonly known as:  NORCO/VICODIN  Take 1-2 tablets by mouth every 4 (four) hours as needed.     LORazepam 0.5 MG tablet  Commonly known as:  ATIVAN  Take 0.5 mg by mouth 2 (two) times daily.     LYSINE PO  Take 1 tablet by mouth daily as needed (fever blisters).     metFORMIN 500 MG tablet  Commonly known as:  GLUCOPHAGE  Take 1,000 mg by mouth daily before supper.     metoprolol succinate 50 MG 24 hr tablet  Commonly known as:  TOPROL-XL  Take 50 mg by mouth daily. Take  with or immediately following a meal.     omeprazole 20 MG capsule  Commonly known as:  PRILOSEC  Take 20 mg by mouth daily as needed.     ondansetron 4 MG disintegrating tablet  Commonly known as:  ZOFRAN ODT  Take 1 tablet (4 mg total) by mouth every 8 (eight) hours as needed for nausea.     promethazine 25 MG tablet  Commonly known as:  PHENERGAN  Take 0.5 tablets (12.5 mg total) by mouth every 6 (six) hours as needed for nausea.     rosuvastatin 5 MG tablet  Commonly known as:  CRESTOR  Take 5 mg by mouth every 7 (seven) days. Monday     traMADol 50 MG tablet  Commonly known as:  ULTRAM  Take 50 mg by mouth as needed.     warfarin 5 MG tablet  Commonly known as:  COUMADIN  Take 5-7.5 mg by mouth daily. Pt to take 5 mg daily except on Monday and Wednesday pt take 7.5 mg tablet ( 1.5 tablet)     zolpidem 10 MG tablet  Commonly known as:  AMBIEN  Take 10 mg by mouth daily.           Follow-up Information   Follow up with [facility], MD On 04/26/2013. (10:15 AM)    Contact information:   [address] [place] [address] [phone]       Follow up with Pearson Grippe, MD. Schedule an appointment as soon as possible for a visit in 3 days. (with Arnetha Massy to check your INR/Coumadin level)    Contact information:   [address] [place] [address] [phone]        The results of significant diagnostics from this hospitalization (including imaging, microbiology, ancillary and laboratory) are listed below for reference.    Significant Diagnostic Studies: No results found.  Microbiology: No results found for this or any previous visit (from the past 240 hour(s)).   Labs: Basic Metabolic Panel:  Recent Labs Lab 03/31/13 1854 04/01/13 0530 04/02/13 0600  NA  --  136 136  K  --  3.4* 3.7  CL  --  100 101  CO2  --  28 29  GLUCOSE  --  140* 149*  BUN  --  7 8  CREATININE 0.78 0.68 0.84  CALCIUM  --  9.0 8.5    CBC:  Recent Labs Lab 03/31/13 1854 04/01/13 0530 04/02/13 0600  WBC 14.1* 10.7* 8.6  HGB 16.6 15.4 14.0  HCT 45.0 43.4 40.1  MCV 82.9 83.5 84.8  PLT 212 179 164   CBG:  Recent Labs  Lab 04/03/13 0749 04/03/13 1214 04/03/13 1706 04/03/13 2137 04/04/13 0757  GLUCAP 126* 125* 127* 103* 125*   INR: 1.35 on 04/04/13  Principal Problem:   Abdominal or pelvic swelling, mass, or lump, other specified site Active Problems:   Atrial fibrillation   Diabetes mellitus   Hypertension   Time coordinating discharge: 30 min  Signed:  Atilano Ina, MD [facility] Surgery, [place] [phone] 04/04/2013, 9:09 AM

## 2013-04-05 ENCOUNTER — Encounter (HOSPITAL_COMMUNITY): Payer: Self-pay | Admitting: General Surgery

## 2013-04-25 ENCOUNTER — Telehealth (INDEPENDENT_AMBULATORY_CARE_PROVIDER_SITE_OTHER): Payer: Self-pay

## 2013-04-25 NOTE — Progress Notes (Signed)
Quick Note:  Please let patient know that mass was a very enlarged lymph node, but no evidence of cancer or infection in the lymph node ______

## 2013-04-25 NOTE — Telephone Encounter (Signed)
Pt notified of benign pathology results

## 2013-04-26 ENCOUNTER — Encounter (INDEPENDENT_AMBULATORY_CARE_PROVIDER_SITE_OTHER): Payer: Self-pay | Admitting: General Surgery

## 2013-04-26 ENCOUNTER — Ambulatory Visit (INDEPENDENT_AMBULATORY_CARE_PROVIDER_SITE_OTHER): Payer: Medicare Other | Admitting: General Surgery

## 2013-04-26 VITALS — BP 110/72 | HR 82 | Temp 98.6°F | Resp 16 | Ht 72.0 in | Wt 162.0 lb

## 2013-04-26 DIAGNOSIS — K4020 Bilateral inguinal hernia, without obstruction or gangrene, not specified as recurrent: Secondary | ICD-10-CM

## 2013-04-26 DIAGNOSIS — R1909 Other intra-abdominal and pelvic swelling, mass and lump: Secondary | ICD-10-CM

## 2013-04-26 NOTE — Progress Notes (Signed)
HISTORY: Patient is a 72 year old male who is status post excision of retroperitoneal mass. The mass ended up being a very large lymph node without evidence of cancer.  His inguinal hernias were closed from the inside with vicryl.  He is not having any surgical pain, but is having significant issues feeling like he is having more bulging in his bilateral groins with his intestine getting stuck.  He is afraid to eat because it hurts in his groin.      EXAM: General:  Alert and oriented.   Incision:  Well healed, staples removed.  +Bulge in R groin.     PATHOLOGY: Diagnosis Retroperitoneal mass, biopsy, Abdomen- retroperitoneal - BENIGN LYMPH NODE WITH MULTIPLE HISTIOCYTIC CLUSTERS, SEE COMMENT.   ASSESSMENT AND PLAN:   Abdominal or pelvic swelling, mass, or lump, other specified site Benign mass.  No pain.  Continue lifting restrictions.    Follow up with me as needed.    Bilateral inguinal hernia (BIH) Refer to Dr. Andrey Campanile.  Has appt tomorrow at noon to discuss.        Maudry Diego, MD Surgical Oncology, General & Endocrine Surgery [facility], [place], MD Massie Maroon., MD

## 2013-04-26 NOTE — Assessment & Plan Note (Signed)
Refer to Dr. Andrey Campanile.  Has appt tomorrow at noon to discuss.

## 2013-04-26 NOTE — Assessment & Plan Note (Signed)
Benign mass.  No pain.  Continue lifting restrictions.    Follow up with me as needed.

## 2013-04-26 NOTE — Patient Instructions (Signed)
Follow up with Dr. Andrey Campanile tomorrow to discuss bilateral inguinal hernias.  Continue lifting restrictions.

## 2013-04-27 ENCOUNTER — Encounter (INDEPENDENT_AMBULATORY_CARE_PROVIDER_SITE_OTHER): Payer: Self-pay | Admitting: General Surgery

## 2013-04-27 ENCOUNTER — Ambulatory Visit (INDEPENDENT_AMBULATORY_CARE_PROVIDER_SITE_OTHER): Payer: Medicare Other | Admitting: General Surgery

## 2013-04-27 VITALS — BP 126/74 | HR 68 | Temp 97.1°F | Resp 15

## 2013-04-27 DIAGNOSIS — K4020 Bilateral inguinal hernia, without obstruction or gangrene, not specified as recurrent: Secondary | ICD-10-CM

## 2013-04-27 NOTE — Patient Instructions (Signed)
Pick up prescription for flomax and start taking 3 days before hernia surgery Discuss with Dr Selena Batten - lovenox bridge for surgery

## 2013-04-27 NOTE — Progress Notes (Signed)
Patient ID: Tommy Weeks, male   DOB: [birth year], 72 y.o.   MRN: [id number]  Chief Complaint  Patient presents with  . New Evaluation    referred by byerly/discuss BIH    HPI Tommy Weeks is a 72 y.o. male.   HPI 72 year old Caucasian male referred by Dr. Donell Beers for evaluation for bilateral inguinal hernias. The patient recently underwent an exploratory laparotomy for excision of a retroperitoneal mass. It turned out to be a lymph node. This was prompted because the patient has been experiencing unexplained weight loss. During surgery it was confirmed that the patient had a large direct right sided inguinal hernia as well as a small early left indirect inguinal hernia. The right-sided direct hernia was closed by suturing the peritoneum shut. He has recovered uneventfully from his exploratory laparotomy. However he continues to have discomfort in his right groin. He describes it as a pressure as well as an occasional sharp shooting pain in his right groin. It bothers him on a daily basis. He denies any diarrhea, constipation, nausea or vomiting. He is scheduled to see the urologist next month due to a slightly enlarged prostate. He did have an episode of urinary retention postoperatively. He states that his appetite does go down when his right groin bothers him. He denies any issues with his left groin.  Past Medical History  Diagnosis Date  . Atrial fibrillation   . Diabetes mellitus without complication   . Hypertension   . Acoustic neuroma   . 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 Hernia   . Anxiety     Past Surgical History  Procedure Laterality Date  . Brain surgery  02    acoustic neuroma  . Cholecystectomy  90's  . Hernia repair Bilateral 90's    inguinal, umbilical  . Retroperitoneal mass excision  03/31/2013  . Laparotomy N/A 03/31/2013    Procedure: Open resection of abdominal mass;  Surgeon: Almond Lint, MD;  Location: [facility] OR;  Service: General;  Laterality: N/A;     History reviewed. No pertinent family history.  Social History History  Substance Use Topics  . Smoking status: Never Smoker   . Smokeless tobacco: Never Used  . Alcohol Use: No    No Known Allergies  Current Outpatient Prescriptions  Medication Sig Dispense Refill  . amLODipine (NORVASC) 5 MG tablet Take 2.5 mg by mouth daily.      Marland Kitchen buPROPion (WELLBUTRIN) 100 MG tablet Take 100 mg by mouth 2 (two) times daily.       Marland Kitchen diltiazem (CARDIZEM CD) 240 MG 24 hr capsule Take 240 mg by mouth daily.      . fenofibrate (TRICOR) 145 MG tablet Take 145 mg by mouth daily.      . fish oil-omega-3 fatty acids 1000 MG capsule Take 2 g by mouth daily.      Marland Kitchen LORazepam (ATIVAN) 0.5 MG tablet Take 0.5 mg by mouth 2 (two) times daily.      Marland Kitchen LYSINE PO Take 1 tablet by mouth daily as needed (fever blisters).      . metFORMIN (GLUCOPHAGE) 500 MG tablet Take 1,000 mg by mouth daily before supper.      . metoprolol succinate (TOPROL-XL) 50 MG 24 hr tablet Take 50 mg by mouth daily. Take with or immediately following a meal.      . omega-3 acid ethyl esters (LOVAZA) 1 G capsule       . ondansetron (ZOFRAN ODT)  4 MG disintegrating tablet Take 1 tablet (4 mg total) by mouth every 8 (eight) hours as needed for nausea.  10 tablet  0  . rosuvastatin (CRESTOR) 5 MG tablet Take 5 mg by mouth every 7 (seven) days. Monday      . warfarin (COUMADIN) 5 MG tablet Take 5-7.5 mg by mouth daily. Pt to take 5 mg daily except on Monday and Wednesday pt take 7.5 mg tablet ( 1.5 tablet)      . zolpidem (AMBIEN) 10 MG tablet Take 10 mg by mouth daily.        No current facility-administered medications for this visit.    Review of Systems Review of Systems  Constitutional: Positive for unexpected weight change. Negative for fever, chills and appetite change.  HENT: Negative for congestion and trouble swallowing.   Eyes: Negative for visual disturbance.  Respiratory: Negative for chest tightness and shortness of  breath.   Cardiovascular: Negative for chest pain and leg swelling.       No PND, no orthopnea, no DOE  Gastrointestinal:       See HPI  Genitourinary: Negative for dysuria and hematuria.  Musculoskeletal: Negative.   Skin: Negative for rash.  Neurological: Positive for weakness. Negative for seizures and speech difficulty.       Denies TIAs,  Amaurosis fugax; right facial droop  Hematological: Does not bruise/bleed easily.  Psychiatric/Behavioral: Negative for behavioral problems and confusion.    Blood pressure 126/74, pulse 68, temperature 97.1 F (36.2 C), temperature source Temporal, resp. rate 15.  Physical Exam Physical Exam  Constitutional: He is oriented to person, place, and time. He appears well-developed. No distress.  HENT:  Head: Normocephalic and atraumatic.  Right Ear: External ear normal.  Left Ear: External ear normal.  Right facial droop  Eyes: Conjunctivae are normal. No scleral icterus.  Neck: Normal range of motion. Neck supple. No tracheal deviation present. No thyromegaly present.  Cardiovascular: Normal rate, normal heart sounds and intact distal pulses.   Pulmonary/Chest: Effort normal and breath sounds normal. No respiratory distress. He has no wheezes.  Abdominal: Soft. He exhibits no distension. There is no tenderness. There is no rebound and no guarding. A hernia is present. Hernia confirmed positive in the right inguinal area and confirmed positive in the left inguinal area. Hernia confirmed negative in the ventral area.    Patient has an almost completely healed lower midline incision. He has a bulge in his right groin which is reducible. He has a small bulge in his left groin only noticeable on standing. He has an enlarged inguinal ring on the left side.  Genitourinary: Testes normal and penis normal.  Possible bilateral varicoceles  Musculoskeletal: Normal range of motion. He exhibits no edema and no tenderness.  Lymphadenopathy:    He has no  cervical adenopathy.  Neurological: He is alert and oriented to person, place, and time. He exhibits normal muscle tone.  Skin: Skin is warm and dry. No rash noted. He is not diaphoretic. No erythema. No pallor.  Psychiatric: He has a normal mood and affect. His behavior is normal. Judgment and thought content normal.    Data Reviewed Dr. Arita Miss notes CT abdomen pelvis Assessment    Unexplained weight loss Bilateral inguinal hernias     Plan    Unfortunately I don't have anything to add or recommend to further evaluate his weight loss.  We discussed the etiology of inguinal hernias. We discussed the signs & symptoms of incarceration & strangulation.  We  discussed non-operative and operative management. We discussed repairing just the right side Versus a bilateral repair. We discussed the pros and cons of this approach. I do not think that The patient is a candidate for laparoscopic approach.  I described the procedure in detail.  The patient was given educational material. We discussed the risks and benefits including but not limited to bleeding, infection, chronic inguinal pain, nerve entrapment, hernia recurrence, mesh complications, hematoma formation, urinary retention, injury to the testicles or the ovaries, numbness in the groin, blood clots, injury to the surrounding structures, and anesthesia risk. We also discussed the typical post operative recovery course, including no heavy lifting for 4-6 weeks. I explained that the likelihood of improvement of their symptoms is Good  The patient has elected To proceed with an open bilateral inguinal hernia repair with mesh.  He is scheduled to see his primary care physician next week he manages his Coumadin. He will need a Lovenox bridge perioperatively. He has done this numerous times before so I do not anticipate a problem.  I did explain that he was at increased risk for urinary retention. We will place him on perioperative Flomax to  hopefully help decrease his risk of going into retention.  We're going to plan to shoot for surgery around the first week of August.  Javarious Elsayed M. Andrey Campanile, MD, FACS General, Bariatric, & Minimally Invasive Surgery [facility], [place]         [facility] M 04/27/2013, 1:21 PM

## 2013-04-28 ENCOUNTER — Telehealth (INDEPENDENT_AMBULATORY_CARE_PROVIDER_SITE_OTHER): Payer: Self-pay | Admitting: General Surgery

## 2013-04-28 NOTE — Telephone Encounter (Signed)
Faxed medical clearance letter and office note from 7/9 to Dr.Kims [phone] confirmation received

## 2013-05-02 ENCOUNTER — Telehealth (INDEPENDENT_AMBULATORY_CARE_PROVIDER_SITE_OTHER): Payer: Self-pay | Admitting: General Surgery

## 2013-05-02 MED ORDER — TAMSULOSIN HCL 0.4 MG PO CAPS: 0.4000 mg | ORAL_CAPSULE | Freq: Every day | ORAL | Status: DC

## 2013-05-02 NOTE — Addendum Note (Signed)
Addended byLiliana Cline on: 05/02/2013 01:31 PM   Modules accepted: Orders

## 2013-05-02 NOTE — Telephone Encounter (Signed)
Flomax e-scribed and patient made aware.

## 2013-05-02 NOTE — Telephone Encounter (Signed)
Yes. Flomax 0.4mg  po qday; #7. Start taking 2 days before hernia surgery

## 2013-05-02 NOTE — Telephone Encounter (Signed)
Patient calling to see if we would prescribe RX for flomax. I see it documented in his patient instructions but it was not sent to pharmacy. Ok to prescribe flomax to patient's pharmacy? Please advise. He uses [facility] in [place]. Please call patient once this is called. [phone].

## 2013-05-09 ENCOUNTER — Encounter (INDEPENDENT_AMBULATORY_CARE_PROVIDER_SITE_OTHER): Payer: Self-pay

## 2013-05-09 ENCOUNTER — Telehealth (INDEPENDENT_AMBULATORY_CARE_PROVIDER_SITE_OTHER): Payer: Self-pay | Admitting: General Surgery

## 2013-05-09 NOTE — Telephone Encounter (Signed)
Called to let pt know that we've received cardiac clearance and that he would need to hold his coumadin 5 days prior to his surgery along with Lovenox bridging which he is aware of from Dr.Brady who he seen on 7/17...told patient to call us if he has any other questions or concerns and he verbalized agreement

## 2013-05-11 ENCOUNTER — Encounter (HOSPITAL_COMMUNITY): Payer: Self-pay | Admitting: Pharmacy Technician

## 2013-05-17 ENCOUNTER — Encounter (HOSPITAL_COMMUNITY): Payer: Self-pay

## 2013-05-17 ENCOUNTER — Encounter (HOSPITAL_COMMUNITY)
Admission: RE | Admit: 2013-05-17 | Discharge: 2013-05-17 | Disposition: A | Discharge status: Home / Self Care | Payer: Medicare Other | Source: Ambulatory Visit | Attending: General Surgery | Admitting: General Surgery

## 2013-05-17 DIAGNOSIS — Z01812 Encounter for preprocedural laboratory examination: Secondary | ICD-10-CM | POA: Insufficient documentation

## 2013-05-17 HISTORY — DX: Depression, unspecified: F32.A

## 2013-05-17 HISTORY — DX: Major depressive disorder, single episode, unspecified: F32.9

## 2013-05-17 LAB — CBC WITH DIFFERENTIAL/PLATELET
Eosinophils Absolute: 0.1 10*3/uL (ref 0.0–0.7)
Eosinophils Relative: 2 % (ref 0–5)
HCT: 48.9 % (ref 39.0–52.0)
Hemoglobin: 16.4 g/dL (ref 13.0–17.0)
Lymphocytes Relative: 29 % (ref 12–46)
Lymphs Abs: 2.3 10*3/uL (ref 0.7–4.0)
MCH: 29.3 pg (ref 26.0–34.0)
MCV: 87.5 fL (ref 78.0–100.0)
Monocytes Absolute: 0.9 10*3/uL (ref 0.1–1.0)
Monocytes Relative: 11 % (ref 3–12)
RBC: 5.59 MIL/uL (ref 4.22–5.81)
WBC: 7.9 10*3/uL (ref 4.0–10.5)

## 2013-05-17 LAB — COMPREHENSIVE METABOLIC PANEL
ALT: 18 U/L (ref 0–53)
BUN: 16 mg/dL (ref 6–23)
CO2: 32 mEq/L (ref 19–32)
Calcium: 10.3 mg/dL (ref 8.4–10.5)
GFR calc Af Amer: >90 mL/min (ref >90)
GFR calc non Af Amer: 82 mL/min — ABNORMAL LOW (ref >90)
Glucose, Bld: 107 mg/dL — ABNORMAL HIGH (ref 70–99)
Total Protein: 7.2 g/dL (ref 6.0–8.3)

## 2013-05-17 LAB — PROTIME-INR: Prothrombin Time: 18.3 seconds — ABNORMAL HIGH (ref 11.6–15.2)

## 2013-05-17 NOTE — Patient Instructions (Addendum)
20 JAQUANE BOUGHNER  05/17/2013   Your procedure is scheduled on:  05/24/13  TUESDAY  Report to [facility] Stay Center at  0530     AM.  Call this number if you have problems the morning of surgery: [phone]       Remember:   Do not eat food  Or drink :After Midnight. Monday NIGHT   Take these medicines the morning of surgery with A SIP OF WATER:  METOPROLOL, DILTIAZEM, AMLODIPINE, ATIVAN, WELLBUTRIN   .  Contacts, dentures or partial plates can not be worn to surgery  Leave suitcase in the car. After surgery it may be brought to your room.  For patients admitted to the hospital, checkout time is 11:00 AM day of  discharge.             SPECIAL INSTRUCTIONS- SEE [place] PREPARING FOR SURGERY INSTRUCTION SHEET-     DO NOT WEAR JEWELRY, LOTIONS, POWDERS, OR PERFUMES.  WOMEN-- DO NOT SHAVE LEGS OR UNDERARMS FOR 12 HOURS BEFORE SHOWERS. MEN MAY SHAVE FACE.  Patients discharged the day of surgery will not be allowed to drive home. IF going home the day of surgery, you must have a driver and someone to stay with you for the first 24 hours  Name and phone number of your driver:               Regino Schultze  PST [phone]  [phone]                 FAILURE TO FOLLOW THESE INSTRUCTIONS MAY RESULT IN  CANCELLATION   OF YOUR SURGERY                                                  Patient Signature _____________________________

## 2013-05-17 NOTE — Progress Notes (Signed)
Chest Ct 5/14 EPIC, EKG 6/14 EPIC, clearance with note Dr Graciela Husbands [facility]

## 2013-05-20 ENCOUNTER — Telehealth (INDEPENDENT_AMBULATORY_CARE_PROVIDER_SITE_OTHER): Payer: Self-pay | Admitting: General Surgery

## 2013-05-20 NOTE — Telephone Encounter (Signed)
Called and spoke with nurse Gwenith Spitz who stated that she will put in the order for PT/INR for morning of surgery...she stated that there wasn't anything on my end that needed to be done..05/20/13 @ 9:34

## 2013-05-24 ENCOUNTER — Encounter (HOSPITAL_COMMUNITY): Payer: Self-pay | Admitting: Anesthesiology

## 2013-05-24 ENCOUNTER — Encounter (HOSPITAL_COMMUNITY)
Admission: RE | Disposition: A | Discharge status: Home / Self Care | Payer: Self-pay | Source: Ambulatory Visit | Attending: General Surgery

## 2013-05-24 ENCOUNTER — Encounter (HOSPITAL_COMMUNITY): Payer: Self-pay | Admitting: *Deleted

## 2013-05-24 ENCOUNTER — Observation Stay (HOSPITAL_COMMUNITY)
Admission: RE | Admit: 2013-05-24 | Discharge: 2013-05-25 | Disposition: A | Discharge status: Home / Self Care | Payer: Medicare Other | Source: Ambulatory Visit | Attending: General Surgery | Admitting: General Surgery

## 2013-05-24 ENCOUNTER — Ambulatory Visit (HOSPITAL_COMMUNITY): Payer: Medicare Other | Admitting: Anesthesiology

## 2013-05-24 DIAGNOSIS — K4020 Bilateral inguinal hernia, without obstruction or gangrene, not specified as recurrent: Principal | ICD-10-CM | POA: Diagnosis present

## 2013-05-24 DIAGNOSIS — Z79899 Other long term (current) drug therapy: Secondary | ICD-10-CM | POA: Insufficient documentation

## 2013-05-24 DIAGNOSIS — R339 Retention of urine, unspecified: Secondary | ICD-10-CM | POA: Diagnosis present

## 2013-05-24 DIAGNOSIS — E119 Type 2 diabetes mellitus without complications: Secondary | ICD-10-CM | POA: Diagnosis present

## 2013-05-24 DIAGNOSIS — I4891 Unspecified atrial fibrillation: Secondary | ICD-10-CM | POA: Diagnosis present

## 2013-05-24 DIAGNOSIS — I10 Essential (primary) hypertension: Secondary | ICD-10-CM | POA: Diagnosis present

## 2013-05-24 HISTORY — PX: INSERTION OF MESH: SHX5868

## 2013-05-24 HISTORY — PX: INGUINAL HERNIA REPAIR: SHX194

## 2013-05-24 LAB — PROTIME-INR
INR: 1.23 (ref 0.00–1.49)
Prothrombin Time: 15.2 seconds (ref 11.6–15.2)

## 2013-05-24 SURGERY — REPAIR, HERNIA, INGUINAL, ADULT
Anesthesia: General | Site: Groin | Laterality: Bilateral | Wound class: Clean

## 2013-05-24 MED ORDER — DILTIAZEM HCL ER COATED BEADS 240 MG PO CP24
240.0000 mg | ORAL_CAPSULE | Freq: Every morning | ORAL | Status: DC
  Administered 2013-05-25: 240 mg via ORAL
  Filled 2013-05-24: qty 1

## 2013-05-24 MED ORDER — POTASSIUM CHLORIDE IN NACL 20-0.9 MEQ/L-% IV SOLN
INTRAVENOUS | Status: DC
  Administered 2013-05-24: 19:00:00 via INTRAVENOUS
  Filled 2013-05-24 (×2): qty 1000

## 2013-05-24 MED ORDER — TAMSULOSIN HCL 0.4 MG PO CAPS: 0.4000 mg | ORAL_CAPSULE | Freq: Every day | ORAL | Status: DC

## 2013-05-24 MED ORDER — ENOXAPARIN SODIUM 40 MG/0.4ML ~~LOC~~ SOLN
40.0000 mg | SUBCUTANEOUS | Status: DC
  Administered 2013-05-25: 40 mg via SUBCUTANEOUS
  Filled 2013-05-24 (×2): qty 0.4

## 2013-05-24 MED ORDER — LIDOCAINE HCL (CARDIAC) 20 MG/ML IV SOLN
INTRAVENOUS | Status: DC | PRN
  Administered 2013-05-24: 60 mg via INTRAVENOUS

## 2013-05-24 MED ORDER — CEFAZOLIN SODIUM-DEXTROSE 2-3 GM-% IV SOLR
INTRAVENOUS | Status: AC
  Filled 2013-05-24: qty 50

## 2013-05-24 MED ORDER — LORAZEPAM 0.5 MG PO TABS
0.5000 mg | ORAL_TABLET | Freq: Two times a day (BID) | ORAL | Status: DC
  Administered 2013-05-24 – 2013-05-25 (×2): 0.5 mg via ORAL
  Filled 2013-05-24 (×2): qty 1

## 2013-05-24 MED ORDER — FENTANYL CITRATE 0.05 MG/ML IJ SOLN
INTRAMUSCULAR | Status: DC | PRN
  Administered 2013-05-24: 50 ug via INTRAVENOUS
  Administered 2013-05-24: 100 ug via INTRAVENOUS

## 2013-05-24 MED ORDER — BUPIVACAINE-EPINEPHRINE 0.25% -1:200000 IJ SOLN
INTRAMUSCULAR | Status: AC
  Filled 2013-05-24: qty 1

## 2013-05-24 MED ORDER — CEFAZOLIN SODIUM-DEXTROSE 2-3 GM-% IV SOLR
2.0000 g | INTRAVENOUS | Status: AC
  Administered 2013-05-24: 2 g via INTRAVENOUS

## 2013-05-24 MED ORDER — PHENYLEPHRINE HCL 10 MG/ML IJ SOLN
10.0000 mg | INTRAVENOUS | Status: DC | PRN
  Administered 2013-05-24: 10 ug/min via INTRAVENOUS

## 2013-05-24 MED ORDER — SODIUM CHLORIDE 0.9 % IV SOLN: 250.0000 mL | INTRAVENOUS | Status: DC | PRN

## 2013-05-24 MED ORDER — ACETAMINOPHEN 325 MG PO TABS: 650.0000 mg | ORAL_TABLET | ORAL | Status: DC | PRN

## 2013-05-24 MED ORDER — EPHEDRINE SULFATE 50 MG/ML IJ SOLN
INTRAMUSCULAR | Status: DC | PRN
  Administered 2013-05-24 (×2): 10 mg via INTRAVENOUS

## 2013-05-24 MED ORDER — ROCURONIUM BROMIDE 100 MG/10ML IV SOLN
INTRAVENOUS | Status: DC | PRN
  Administered 2013-05-24: 10 mg via INTRAVENOUS
  Administered 2013-05-24: 40 mg via INTRAVENOUS
  Administered 2013-05-24 (×2): 10 mg via INTRAVENOUS

## 2013-05-24 MED ORDER — ONDANSETRON HCL 4 MG/2ML IJ SOLN
INTRAMUSCULAR | Status: DC | PRN
  Administered 2013-05-24: 4 mg via INTRAVENOUS

## 2013-05-24 MED ORDER — LACTATED RINGERS IV SOLN
INTRAVENOUS | Status: DC | PRN
  Administered 2013-05-24: 07:00:00 via INTRAVENOUS

## 2013-05-24 MED ORDER — OXYCODONE HCL 5 MG PO TABS
5.0000 mg | ORAL_TABLET | ORAL | Status: DC | PRN
  Administered 2013-05-24 – 2013-05-25 (×3): 5 mg via ORAL
  Filled 2013-05-24: qty 2
  Filled 2013-05-24 (×2): qty 1

## 2013-05-24 MED ORDER — SODIUM CHLORIDE 0.9 % IJ SOLN: 3.0000 mL | INTRAMUSCULAR | Status: DC | PRN

## 2013-05-24 MED ORDER — PHENYLEPHRINE HCL 10 MG/ML IJ SOLN
INTRAMUSCULAR | Status: DC | PRN
  Administered 2013-05-24: 80 ug via INTRAVENOUS
  Administered 2013-05-24: 40 ug via INTRAVENOUS
  Administered 2013-05-24 (×2): 80 ug via INTRAVENOUS

## 2013-05-24 MED ORDER — ACETAMINOPHEN 650 MG RE SUPP
650.0000 mg | RECTAL | Status: DC | PRN
  Filled 2013-05-24: qty 1

## 2013-05-24 MED ORDER — PROPOFOL 10 MG/ML IV BOLUS
INTRAVENOUS | Status: DC | PRN
  Administered 2013-05-24: 150 mg via INTRAVENOUS

## 2013-05-24 MED ORDER — MEPERIDINE HCL 50 MG/ML IJ SOLN: 6.2500 mg | INTRAMUSCULAR | Status: DC | PRN

## 2013-05-24 MED ORDER — INSULIN ASPART 100 UNIT/ML ~~LOC~~ SOLN: 0.0000 [IU] | Freq: Three times a day (TID) | SUBCUTANEOUS | Status: DC

## 2013-05-24 MED ORDER — SODIUM CHLORIDE 0.9 % IJ SOLN: 3.0000 mL | Freq: Two times a day (BID) | INTRAMUSCULAR | Status: DC

## 2013-05-24 MED ORDER — PANTOPRAZOLE SODIUM 40 MG IV SOLR
40.0000 mg | INTRAVENOUS | Status: DC
  Administered 2013-05-24: 40 mg via INTRAVENOUS
  Filled 2013-05-24 (×2): qty 40

## 2013-05-24 MED ORDER — METOPROLOL SUCCINATE ER 50 MG PO TB24
50.0000 mg | ORAL_TABLET | Freq: Every morning | ORAL | Status: DC
  Administered 2013-05-25: 50 mg via ORAL
  Filled 2013-05-24: qty 1

## 2013-05-24 MED ORDER — HYDROMORPHONE HCL PF 1 MG/ML IJ SOLN: 0.2500 mg | INTRAMUSCULAR | Status: DC | PRN

## 2013-05-24 MED ORDER — OXYCODONE HCL 5 MG PO TABS
5.0000 mg | ORAL_TABLET | Freq: Once | ORAL | Status: AC | PRN
  Administered 2013-05-24: 5 mg via ORAL
  Filled 2013-05-24: qty 1

## 2013-05-24 MED ORDER — ONDANSETRON HCL 4 MG/2ML IJ SOLN: 4.0000 mg | Freq: Four times a day (QID) | INTRAMUSCULAR | Status: DC | PRN

## 2013-05-24 MED ORDER — GLYCOPYRROLATE 0.2 MG/ML IJ SOLN
INTRAMUSCULAR | Status: DC | PRN
  Administered 2013-05-24: 0.4 mg via INTRAVENOUS

## 2013-05-24 MED ORDER — AMLODIPINE BESYLATE 2.5 MG PO TABS
2.5000 mg | ORAL_TABLET | Freq: Every morning | ORAL | Status: DC
  Administered 2013-05-25: 2.5 mg via ORAL
  Filled 2013-05-24: qty 1

## 2013-05-24 MED ORDER — PROMETHAZINE HCL 25 MG/ML IJ SOLN: 6.2500 mg | INTRAMUSCULAR | Status: DC | PRN

## 2013-05-24 MED ORDER — NEOSTIGMINE METHYLSULFATE 1 MG/ML IJ SOLN
INTRAMUSCULAR | Status: DC | PRN
  Administered 2013-05-24: 3 mg via INTRAVENOUS

## 2013-05-24 MED ORDER — KETOROLAC TROMETHAMINE 30 MG/ML IJ SOLN
15.0000 mg | Freq: Four times a day (QID) | INTRAMUSCULAR | Status: DC
  Administered 2013-05-24 – 2013-05-25 (×5): 15 mg via INTRAVENOUS
  Filled 2013-05-24 (×6): qty 1

## 2013-05-24 MED ORDER — OXYCODONE-ACETAMINOPHEN 5-325 MG PO TABS: 1.0000 | ORAL_TABLET | ORAL | Status: DC | PRN

## 2013-05-24 MED ORDER — OXYCODONE HCL 5 MG/5ML PO SOLN
5.0000 mg | Freq: Once | ORAL | Status: AC | PRN
  Filled 2013-05-24: qty 5

## 2013-05-24 MED ORDER — MORPHINE SULFATE 10 MG/ML IJ SOLN
1.0000 mg | INTRAMUSCULAR | Status: DC | PRN
  Administered 2013-05-24 – 2013-05-25 (×2): 3 mg via INTRAVENOUS
  Filled 2013-05-24 (×2): qty 1

## 2013-05-24 MED ORDER — BUPROPION HCL 100 MG PO TABS
100.0000 mg | ORAL_TABLET | Freq: Two times a day (BID) | ORAL | Status: DC
  Administered 2013-05-24 – 2013-05-25 (×2): 100 mg via ORAL
  Filled 2013-05-24 (×3): qty 1

## 2013-05-24 MED ORDER — BUPIVACAINE-EPINEPHRINE 0.25% -1:200000 IJ SOLN
INTRAMUSCULAR | Status: DC | PRN
  Administered 2013-05-24: 50 mL

## 2013-05-24 SURGICAL SUPPLY — 27 items
BENZOIN TINCTURE PRP APPL 2/3 (GAUZE/BANDAGES/DRESSINGS) ×2 IMPLANT
BLADE SURG SZ10 CARB STEEL (BLADE) ×2 IMPLANT
CLOTH BEACON ORANGE TIMEOUT ST (SAFETY) ×2 IMPLANT
DECANTER SPIKE VIAL GLASS SM (MISCELLANEOUS) IMPLANT
DRAIN PENROSE 18X1/2 LTX STRL (DRAIN) ×2 IMPLANT
DRAPE LAPAROTOMY TRNSV 102X78 (DRAPE) ×2 IMPLANT
DRAPE UTILITY XL STRL (DRAPES) ×2 IMPLANT
DRSG TEGADERM 4X4.75 (GAUZE/BANDAGES/DRESSINGS) ×2 IMPLANT
ELECT REM PT RETURN 9FT ADLT (ELECTROSURGICAL) ×2
ELECTRODE REM PT RTRN 9FT ADLT (ELECTROSURGICAL) ×1 IMPLANT
GLOVE BIO SURGEON STRL SZ7.5 (GLOVE) ×2 IMPLANT
GLOVE BIOGEL M STRL SZ7.5 (GLOVE) ×2 IMPLANT
GLOVE INDICATOR 8.0 STRL GRN (GLOVE) ×2 IMPLANT
GOWN STRL NON-REIN LRG LVL3 (GOWN DISPOSABLE) IMPLANT
GOWN STRL REIN XL XLG (GOWN DISPOSABLE) ×4 IMPLANT
KIT BASIN OR (CUSTOM PROCEDURE TRAY) ×2 IMPLANT
MESH ULTRAPRO 6X6 15CM15CM (Mesh General) ×2 IMPLANT
NEEDLE HYPO 22GX1.5 SAFETY (NEEDLE) ×2 IMPLANT
PACK GENERAL/GYN (CUSTOM PROCEDURE TRAY) ×2 IMPLANT
STRIP CLOSURE SKIN 1/2X4 (GAUZE/BANDAGES/DRESSINGS) ×2 IMPLANT
SUT MNCRL AB 4-0 PS2 18 (SUTURE) ×2 IMPLANT
SUT PROLENE 2 0 CT2 30 (SUTURE) ×8 IMPLANT
SUT VIC AB 2-0 SH 27 (SUTURE) ×2
SUT VIC AB 2-0 SH 27X BRD (SUTURE) ×2 IMPLANT
SUT VIC AB 3-0 SH 18 (SUTURE) ×4 IMPLANT
SYR CONTROL 10ML LL (SYRINGE) ×2 IMPLANT
TOWEL OR 17X26 10 PK STRL BLUE (TOWEL DISPOSABLE) ×2 IMPLANT

## 2013-05-24 NOTE — Op Note (Signed)
Tommy Weeks [id number] 07-31-1941 05/24/2013  Open Repair of Bilateral Indirect Inguinal Hernias with Mesh Procedure Note  Indications: The patient presented with a history of a bilateral, reducible hernias, right much more symptomatic than left    Pre-operative Diagnosis: bilateral reducible inguinal hernias  Post-operative Diagnosis: bilateral indirect inguinal hernias   Surgeon: Atilano Ina   Assistants: none  Anesthesia: General endotracheal anesthesia and Local anesthesia 0.25.% bupivacaine, with epinephrine  ASA Class: 3   Surgeon: [name]. Andrey Campanile, MD, FACS  Procedure Details  The patient was seen again in the Holding Room. The risks, benefits, complications, treatment options, and expected outcomes were discussed with the patient. The possibilities of reaction to medication, pulmonary aspiration, perforation of viscus, bleeding, recurrent infection, the need for additional procedures, and development of a complication requiring transfusion or further operation were discussed with the patient and/or family. The likelihood of success in repairing the hernia and returning the patient to their previous functional status is good.  There was concurrence with the proposed plan, and informed consent was obtained. The site of surgery was properly noted/marked. The patient was taken to the Operating Room, identified as Tommy Weeks, and the procedure verified as bilateral inguinal hernia repair. A Time Out was held and the above information confirmed. He received IV antibiotic as well as perioperative Flomax because of his history of urinary retention.   The patient was placed in the supine position and underwent induction of anesthesia. The lower abdomen and groin was prepped with Chloraprep and draped in the standard fashion with Ioban, and 0.25% Marcaine with epinephrine was used to anesthetize the skin over the mid-portion of the right inguinal canal. An oblique incision was made.  Dissection was carried down through the subcutaneous tissue with cautery to the external oblique fascia.  We opened the external oblique fascia along the direction of its fibers to the external ring.  The spermatic cord was circumferentially dissected bluntly and retracted with a Penrose drain.  The floor of the inguinal canal was inspected and there was no direct defect.  We skeletonized the spermatic cord and isolated a thickened indirect hernia sac.  We used a 3 x 6 inch piece of Ultrapro mesh (cut from a 6 x6 inch piece of Ultrapro mesh), which was cut into a keyhole shape.  This was secured with 2-0 Prolene, beginning at the pubic tubercle, running this along the shelving edge inferiorly. Superiorly, the mesh was secured to the internal oblique fascia with interrupted 2-0 Prolene sutures.  The tails of the mesh were sutured together behind the spermatic cord.  The mesh was tucked underneath the external oblique fascia laterally.  Local was infiltrated in all tissue planes. The external oblique fascia was reapproximated with 2-0 Vicryl.  3-0 Vicryl was used to close the subcutaneous tissues and 4-0 Monocryl was used to close the skin in subcuticular fashion.    I then turned my attention to the left groin.  0.25% Marcaine with epinephrine was used to anesthetize the skin over the mid-portion of the left inguinal canal. An oblique incision was made. Dissection was carried down through the subcutaneous tissue with cautery to the external oblique fascia.  We opened the external oblique fascia along the direction of its fibers to the external ring.  The spermatic cord was circumferentially dissected bluntly and retracted with a Penrose drain.  The floor of the inguinal canal was inspected and there was no direct defect.  We skeletonized the spermatic cord and isolated a small  indirect hernia sac.  We used a 3 x 6 inch piece of Ultrapro mesh (cut from a 6 x6 inch piece of Ultrapro mesh), which was cut into a  keyhole shape.  This was secured with 2-0 Prolene, beginning at the pubic tubercle, running this along the shelving edge inferiorly. Superiorly, the mesh was secured to the internal oblique fascia with interrupted 2-0 Prolene sutures.  The tails of the mesh were sutured together behind the spermatic cord.  The mesh was tucked underneath the external oblique fascia laterally.  Local was infiltrated in all tissue planes. The external oblique fascia was reapproximated with 2-0 Vicryl.  3-0 Vicryl was used to close the subcutaneous tissues and 4-0 Monocryl was used to close the skin in subcuticular fashion.    Dermabond was applied to both incisions.  The patient was then extubated and brought to the recovery room in stable condition.  All sponge, instrument, and needle counts were correct prior to closure and at the conclusion of the case.   Estimated Blood Loss: Minimal                 Complications: None; patient tolerated the procedure well.         Disposition: PACU - hemodynamically stable.         Condition: stable  [name]. Andrey Campanile, MD, FACS General, Bariatric, & Minimally Invasive Surgery [facility], [place]

## 2013-05-24 NOTE — H&P (View-Only) (Signed)
Patient ID: Tommy Weeks, male   DOB: 06/12/1941, 71 y.o.   MRN: [id number]  Chief Complaint  Patient presents with  . New Evaluation    referred by byerly/discuss BIH    HPI Tommy Weeks is a 71 y.o. male.   HPI 71-year-old Caucasian male referred by Dr. Byerly for evaluation for bilateral inguinal hernias. The patient recently underwent an exploratory laparotomy for excision of a retroperitoneal mass. It turned out to be a lymph node. This was prompted because the patient has been experiencing unexplained weight loss. During surgery it was confirmed that the patient had a large direct right sided inguinal hernia as well as a small early left indirect inguinal hernia. The right-sided direct hernia was closed by suturing the peritoneum shut. He has recovered uneventfully from his exploratory laparotomy. However he continues to have discomfort in his right groin. He describes it as a pressure as well as an occasional sharp shooting pain in his right groin. It bothers him on a daily basis. He denies any diarrhea, constipation, nausea or vomiting. He is scheduled to see the urologist next month due to a slightly enlarged prostate. He did have an episode of urinary retention postoperatively. He states that his appetite does go down when his right groin bothers him. He denies any issues with his left groin.  Past Medical History  Diagnosis Date  . Atrial fibrillation   . Diabetes mellitus without complication   . Hypertension   . Acoustic neuroma   . 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 Hernia   . Anxiety     Past Surgical History  Procedure Laterality Date  . Brain surgery  02    acoustic neuroma  . Cholecystectomy  90's  . Hernia repair Bilateral 90's    inguinal, umbilical  . Retroperitoneal mass excision  03/31/2013  . Laparotomy N/A 03/31/2013    Procedure: Open resection of abdominal mass;  Surgeon: Faera Byerly, MD;  Location: [facility] OR;  Service: General;  Laterality: N/A;     History reviewed. No pertinent family history.  Social History History  Substance Use Topics  . Smoking status: Never Smoker   . Smokeless tobacco: Never Used  . Alcohol Use: No    No Known Allergies  Current Outpatient Prescriptions  Medication Sig Dispense Refill  . amLODipine (NORVASC) 5 MG tablet Take 2.5 mg by mouth daily.      . buPROPion (WELLBUTRIN) 100 MG tablet Take 100 mg by mouth 2 (two) times daily.       . diltiazem (CARDIZEM CD) 240 MG 24 hr capsule Take 240 mg by mouth daily.      . fenofibrate (TRICOR) 145 MG tablet Take 145 mg by mouth daily.      . fish oil-omega-3 fatty acids 1000 MG capsule Take 2 g by mouth daily.      . LORazepam (ATIVAN) 0.5 MG tablet Take 0.5 mg by mouth 2 (two) times daily.      . LYSINE PO Take 1 tablet by mouth daily as needed (fever blisters).      . metFORMIN (GLUCOPHAGE) 500 MG tablet Take 1,000 mg by mouth daily before supper.      . metoprolol succinate (TOPROL-XL) 50 MG 24 hr tablet Take 50 mg by mouth daily. Take with or immediately following a meal.      . omega-3 acid ethyl esters (LOVAZA) 1 G capsule       . ondansetron (ZOFRAN ODT)   4 MG disintegrating tablet Take 1 tablet (4 mg total) by mouth every 8 (eight) hours as needed for nausea.  10 tablet  0  . rosuvastatin (CRESTOR) 5 MG tablet Take 5 mg by mouth every 7 (seven) days. Monday      . warfarin (COUMADIN) 5 MG tablet Take 5-7.5 mg by mouth daily. Pt to take 5 mg daily except on Monday and Wednesday pt take 7.5 mg tablet ( 1.5 tablet)      . zolpidem (AMBIEN) 10 MG tablet Take 10 mg by mouth daily.        No current facility-administered medications for this visit.    Review of Systems Review of Systems  Constitutional: Positive for unexpected weight change. Negative for fever, chills and appetite change.  HENT: Negative for congestion and trouble swallowing.   Eyes: Negative for visual disturbance.  Respiratory: Negative for chest tightness and shortness of  breath.   Cardiovascular: Negative for chest pain and leg swelling.       No PND, no orthopnea, no DOE  Gastrointestinal:       See HPI  Genitourinary: Negative for dysuria and hematuria.  Musculoskeletal: Negative.   Skin: Negative for rash.  Neurological: Positive for weakness. Negative for seizures and speech difficulty.       Denies TIAs,  Amaurosis fugax; right facial droop  Hematological: Does not bruise/bleed easily.  Psychiatric/Behavioral: Negative for behavioral problems and confusion.    Blood pressure 126/74, pulse 68, temperature 97.1 F (36.2 C), temperature source Temporal, resp. rate 15.  Physical Exam Physical Exam  Constitutional: He is oriented to person, place, and time. He appears well-developed. No distress.  HENT:  Head: Normocephalic and atraumatic.  Right Ear: External ear normal.  Left Ear: External ear normal.  Right facial droop  Eyes: Conjunctivae are normal. No scleral icterus.  Neck: Normal range of motion. Neck supple. No tracheal deviation present. No thyromegaly present.  Cardiovascular: Normal rate, normal heart sounds and intact distal pulses.   Pulmonary/Chest: Effort normal and breath sounds normal. No respiratory distress. He has no wheezes.  Abdominal: Soft. He exhibits no distension. There is no tenderness. There is no rebound and no guarding. A hernia is present. Hernia confirmed positive in the right inguinal area and confirmed positive in the left inguinal area. Hernia confirmed negative in the ventral area.    Patient has an almost completely healed lower midline incision. He has a bulge in his right groin which is reducible. He has a small bulge in his left groin only noticeable on standing. He has an enlarged inguinal ring on the left side.  Genitourinary: Testes normal and penis normal.  Possible bilateral varicoceles  Musculoskeletal: Normal range of motion. He exhibits no edema and no tenderness.  Lymphadenopathy:    He has no  cervical adenopathy.  Neurological: He is alert and oriented to person, place, and time. He exhibits normal muscle tone.  Skin: Skin is warm and dry. No rash noted. He is not diaphoretic. No erythema. No pallor.  Psychiatric: He has a normal mood and affect. His behavior is normal. Judgment and thought content normal.    Data Reviewed Dr. Byerly's notes CT abdomen pelvis Assessment    Unexplained weight loss Bilateral inguinal hernias     Plan    Unfortunately I don't have anything to add or recommend to further evaluate his weight loss.  We discussed the etiology of inguinal hernias. We discussed the signs & symptoms of incarceration & strangulation.  We   discussed non-operative and operative management. We discussed repairing just the right side Versus a bilateral repair. We discussed the pros and cons of this approach. I do not think that The patient is a candidate for laparoscopic approach.  I described the procedure in detail.  The patient was given educational material. We discussed the risks and benefits including but not limited to bleeding, infection, chronic inguinal pain, nerve entrapment, hernia recurrence, mesh complications, hematoma formation, urinary retention, injury to the testicles or the ovaries, numbness in the groin, blood clots, injury to the surrounding structures, and anesthesia risk. We also discussed the typical post operative recovery course, including no heavy lifting for 4-6 weeks. I explained that the likelihood of improvement of their symptoms is Good  The patient has elected To proceed with an open bilateral inguinal hernia repair with mesh.  He is scheduled to see his primary care physician next week he manages his Coumadin. He will need a Lovenox bridge perioperatively. He has done this numerous times before so I do not anticipate a problem.  I did explain that he was at increased risk for urinary retention. We will place him on perioperative Flomax to  hopefully help decrease his risk of going into retention.  We're going to plan to shoot for surgery around the first week of August.  Tommy Dennard M. Bryah Ocheltree, MD, FACS General, Bariatric, & Minimally Invasive Surgery [facility]         Ensley Blas M 04/27/2013, 1:21 PM    

## 2013-05-24 NOTE — Progress Notes (Signed)
I & O cath done as ordered with only 75 cc obtained. Pt states he does feel some relief after this. Pt lying quietly on stretcher drinking tea

## 2013-05-24 NOTE — Anesthesia Postprocedure Evaluation (Signed)
Anesthesia Post Note  Patient: Tommy Weeks  Procedure(s) Performed: Procedure(s) (LRB): bilateral open HERNIA REPAIR INGUINAL ADULT (Bilateral) INSERTION OF MESH (Bilateral)  Anesthesia type: General  Patient location: PACU  Post pain: Pain level controlled  Post assessment: Post-op Vital signs reviewed  Last Vitals: BP 104/70  Pulse 74  Temp(Src) 36.7 C (Oral)  Resp 19  SpO2 97%  Post vital signs: Reviewed  Level of consciousness: sedated  Complications: No apparent anesthesia complications

## 2013-05-24 NOTE — Anesthesia Preprocedure Evaluation (Addendum)
Anesthesia Evaluation  Patient identified by MRN, date of birth, ID band Patient awake    Reviewed: Allergy & Precautions, H&P , NPO status , Patient's Chart, lab work & pertinent test results, reviewed documented beta blocker date and time   Airway Mallampati: I TM Distance: >3 FB Neck ROM: Full    Dental  (+) Dental Advisory Given and Teeth Intact   Pulmonary neg pulmonary ROS,  breath sounds clear to auscultation        Cardiovascular hypertension, Pt. on medications and Pt. on home beta blockers negative cardio ROS  + dysrhythmias Atrial Fibrillation Rhythm:Regular Rate:Normal     Neuro/Psych PSYCHIATRIC DISORDERS Anxiety Depression negative neurological ROS     GI/Hepatic negative GI ROS, Neg liver ROS,   Endo/Other  negative endocrine ROSdiabetes, Type 2  Renal/GU negative Renal ROS     Musculoskeletal negative musculoskeletal ROS (+)   Abdominal   Peds  Hematology negative hematology ROS (+)   Anesthesia Other Findings   Reproductive/Obstetrics                          Anesthesia Physical  Anesthesia Plan  ASA: III  Anesthesia Plan: General   Post-op Pain Management:    Induction: Intravenous  Airway Management Planned: Oral ETT  Additional Equipment:   Intra-op Plan:   Post-operative Plan: Extubation in OR  Informed Consent: I have reviewed the patients History and Physical, chart, labs and discussed the procedure including the risks, benefits and alternatives for the proposed anesthesia with the patient or authorized representative who has indicated his/her understanding and acceptance.   Dental advisory given  Plan Discussed with: CRNA  Anesthesia Plan Comments:        Anesthesia Quick Evaluation

## 2013-05-24 NOTE — Progress Notes (Signed)
Pt ambulated around short stay nursing unit x2 then attempted to void.  Pt unable to urinate.  Bladder scan done showing .  Pt states his beginning to become uncomfortable. Dr. Andrey Campanile notified.  New orders given.

## 2013-05-24 NOTE — Progress Notes (Signed)
Ambulated in hall 200 feet and tolerated this well. Has been taking po fluids without problem but does not currently have the urge to void . Pain at surgical site increased to 4/10 and pt medicated for this. Pt reclining on stretcher at this time

## 2013-05-24 NOTE — Progress Notes (Addendum)
Patient has over 700cc in bladder when bladder scanned. Paged Dr Andrey Campanile. Order received to leave in catheter overnight.  Patient transferred to room 1532 after report is called.

## 2013-05-24 NOTE — Transfer of Care (Signed)
Immediate Anesthesia Transfer of Care Note  Patient: DELDRICK Weeks  Procedure(s) Performed: Procedure(s): bilateral open HERNIA REPAIR INGUINAL ADULT (Bilateral) INSERTION OF MESH (Bilateral)  Patient Location: PACU  Anesthesia Type:General  Level of Consciousness: sedated, patient cooperative and responds to stimulation  Airway & Oxygen Therapy: Patient Spontanous Breathing and Patient connected to face mask oxygen  Post-op Assessment: Report given to PACU RN and Post -op Vital signs reviewed and stable  Post vital signs: Reviewed and stable  Complications: No apparent anesthesia complications

## 2013-05-24 NOTE — Progress Notes (Signed)
Pt once again ambulated in hall 200 feet. Pain is  now 3/10 and pt stood at bedside to attempt to void without success stating that the bladder pressure was again returning. Dr Andrey Campanile called to check on patient and has decided to admit patient ovenight. Informed pt of this and he expressed relief that he would be spending the night

## 2013-05-24 NOTE — Interval H&P Note (Signed)
History and Physical Interval Note:  05/24/2013 7:26 AM  Tommy Weeks  has presented today for surgery, with the diagnosis of bilateral inguinal hernia  The various methods of treatment have been discussed with the patient and family. After consideration of risks, benefits and other options for treatment, the patient has consented to  Procedure(s): bilateral open HERNIA REPAIR INGUINAL ADULT (Bilateral) INSERTION OF MESH (Bilateral) as a surgical intervention .  The patient's history has been reviewed, patient examined, no change in status, stable for surgery.  I have reviewed the patient's chart and labs.  Questions were answered to the patient's satisfaction.    [name]. Andrey Campanile, MD, FACS General, Bariatric, & Minimally Invasive Surgery [facility], [place]  [facility] M

## 2013-05-24 NOTE — Preoperative (Signed)
Beta Blockers   Reason not to administer Beta Blockers:Not Applicable 

## 2013-05-24 NOTE — Progress Notes (Signed)
Pt doing  well without complaints.  Room darkened to allow pt for rest time.  His sister is at his bedside.  D/C instructions reviewed with pt and his sister Leonie Douglas.

## 2013-05-25 ENCOUNTER — Telehealth (INDEPENDENT_AMBULATORY_CARE_PROVIDER_SITE_OTHER): Payer: Self-pay | Admitting: General Surgery

## 2013-05-25 ENCOUNTER — Other Ambulatory Visit (INDEPENDENT_AMBULATORY_CARE_PROVIDER_SITE_OTHER): Payer: Self-pay | Admitting: General Surgery

## 2013-05-25 ENCOUNTER — Encounter (HOSPITAL_COMMUNITY): Payer: Self-pay | Admitting: General Surgery

## 2013-05-25 DIAGNOSIS — R339 Retention of urine, unspecified: Secondary | ICD-10-CM

## 2013-05-25 NOTE — Care Management Note (Signed)
    Page 1 of 1   05/25/2013     10:43:31 AM   CARE MANAGEMENT NOTE 05/25/2013  Patient:  Tommy Weeks, Tommy Weeks   Account Number:  [id number]  Date Initiated:  05/25/2013  Documentation initiated by:  Lorenda Ishihara  Subjective/Objective Assessment:   72 yo male admitted s/p inguinal hernia repair. PTA lived at home with spouse.     Action/Plan:   Home when stable, may need foley at d/c   Anticipated DC Date:  05/25/2013   Anticipated DC Plan:  HOME/SELF CARE      DC Planning Services  CM consult      Choice offered to / List presented to:             Status of service:  Completed, signed off Medicare Important Message given?   (If response is "NO", the following Medicare IM given date fields will be blank) Date Medicare IM given:   Date Additional Medicare IM given:    Discharge Disposition:  HOME/SELF CARE  Per UR Regulation:  Reviewed for med. necessity/level of care/duration of stay  If discussed at Long Length of Stay Meetings, dates discussed:    Comments:

## 2013-05-25 NOTE — Telephone Encounter (Signed)
Put order in for Urology per verbal orders from EW...called and spoke with Gwyn at [facility] to set up appt for 05/27/13 at 11 to see N.P. then called to make pt aware...he is aware and verbalized agreement with POC at this time

## 2013-05-25 NOTE — Progress Notes (Signed)
1 Day Post-Op  Subjective: Had urinary retention postop yesterday. Foley placed. No n/v. Good uop overnight. Tolerated diet  Objective: Vital signs in last 24 hours: Temp:  [98.1 F (36.7 C)-99.6 F (37.6 C)] 98.7 F (37.1 C) (08/06 0625) Pulse Rate:  [66-111] 89 (08/06 0625) Resp:  [14-19] 18 (08/06 0625) BP: (101-135)/(56-84) 126/79 mmHg (08/06 0625) SpO2:  [96 %-100 %] 96 % (08/06 0625) Weight:  [163 lb (73.936 kg)] 163 lb (73.936 kg) (08/05 1736) Last BM Date: 05/22/13  Intake/Output from previous day: 08/05 0701 - 08/06 0700 In: 3380 [P.O.:1320; I.V.:2060] Out: 3435 [Urine:3425; Blood:10] Intake/Output this shift:    Alert, nad cta Reg Soft, nt, nd B/l groin - incisions c/d/i. Some TTP. No hematoma  Lab Results:  No results found for this basename: WBC, HGB, HCT, PLT,  in the last 72 hours BMET No results found for this basename: NA, K, CL, CO2, GLUCOSE, BUN, CREATININE, CALCIUM,  in the last 72 hours PT/INR  Recent Labs  05/24/13 0620  LABPROT 15.2  INR 1.23   ABG No results found for this basename: PHART, PCO2, PO2, HCO3,  in the last 72 hours  Studies/Results: No results found.  Anti-infectives: Anti-infectives   Start     Dose/Rate Route Frequency Ordered Stop   05/24/13 0543  ceFAZolin (ANCEF) IVPB 2 g/50 mL premix     2 g 100 mL/hr over 30 Minutes Intravenous On call to O.R. 05/24/13 0543 05/24/13 0744      Assessment/Plan: s/p Procedure(s): bilateral open HERNIA REPAIR INGUINAL ADULT (Bilateral) INSERTION OF MESH (Bilateral)  Urinary retention - gave pt 2 options another voiding trial or go home early this am with foley. Pt wants another voiding trial; if fails, will go home with leg bag and f/u with Dr Vernie Ammons.  Discussed d/c instructions and lovenox/coumadin instructions. Pt has f/u Monday for INR check  Tommy Weeks. Tommy Campanile, MD, FACS General, Bariatric, & Minimally Invasive Surgery [facility], [place]   LOS: 1 day     Tommy Weeks 05/25/2013

## 2013-05-25 NOTE — Progress Notes (Signed)
Pt is to be d/c'd home with foley catheter and leg bag per MD order due to urinary retention.  Pt taught by nurse how to change leg bag to standard drainage bag for foley catheter and taught how to empty bags.  Pt feels comfortable going home with foley catheter.  No concerns or questions.

## 2013-05-26 NOTE — Discharge Summary (Signed)
Physician Discharge Summary  Tommy Weeks:[id number] DOB: 26-Jun-1941 DOA: 05/24/2013  PCP: Pearson Grippe, MD  Admit date: 05/24/2013 Discharge date: 05/25/2013  Recommendations for Outpatient Follow-up:  1. Follow up with [facility] for urinary retention - you will be called with your appt time and date  Follow-up Information   Follow up with Atilano Ina, MD On 06/09/2013. (11:30 AM)    Contact information:   [address] [place] [address] [phone]       Follow up with [facility], [facility]. Schedule an appointment as soon as possible for a visit in 3 days. (to get your coumadin/INR level checked)      Discharge Diagnoses:  Patient Active Problem List   Diagnosis Date Noted  . Bilateral inguinal hernia (BIH) 04/26/2013  . Urinary retention 04/04/2013  . Atrial fibrillation 03/31/2013  . Diabetes mellitus 03/31/2013  . Hypertension 03/31/2013   Surgical Procedure: OPEN REPAIR OF BILATERAL INDIRECT INGUINAL HERNIAS WITH MESH  Discharge Condition: good Disposition: to home with foley  Diet recommendation: cardiac fit  Filed Weights   05/24/13 1736  Weight: 163 lb (73.936 kg)    Hospital Course:  71yo WM was taken to the OR for a planned open repair of bilateral indirect inguinal hernias with mesh. He was placed on Flomax perioperatively because he had a recent history of postoperative urinary retention after his exploratory laparotomy in June 2014.  He was unable to completely void in short stay - he had a high post void residual. A foley was placed. He was kept overnight. He was maintained on his home meds. On POD 1 he was given therapeutic lovenox as part of his coumadin bridge. He was given another voiding trial that morning. He was able to urinate some but unable to completely his bladder. A foley was replaced and his was instructed on foley care. I had previously gone his discharge instructions. We also discussed that he would need his coumadin/INR  level checked. His vitals were stable. He was tolerating diet and his pain was well controlled.    Discharge Instructions   Future Appointments Provider Department Dept Phone   06/09/2013 11:30 AM Atilano Ina, MD [facility], [place] [phone]       Medication List         amLODipine 5 MG tablet  Commonly known as:  NORVASC  Take 2.5 mg by mouth every morning.     buPROPion 100 MG tablet  Commonly known as:  WELLBUTRIN  Take 100 mg by mouth 2 (two) times daily.     diltiazem 240 MG 24 hr capsule  Commonly known as:  CARDIZEM CD  Take 240 mg by mouth every morning.     enoxaparin 40 MG/0.4ML injection  Commonly known as:  LOVENOX  Inject 40 mg into the skin daily.     fenofibrate 145 MG tablet  Commonly known as:  TRICOR  Take 145 mg by mouth at bedtime.     fish oil-omega-3 fatty acids 1000 MG capsule  Take 2 g by mouth 2 (two) times daily.     LORazepam 0.5 MG tablet  Commonly known as:  ATIVAN  Take 0.5 mg by mouth 2 (two) times daily.     metFORMIN 500 MG tablet  Commonly known as:  GLUCOPHAGE  Take 1,000 mg by mouth daily before supper.     metoprolol succinate 50 MG 24 hr tablet  Commonly known as:  TOPROL-XL  Take 50 mg by mouth every  morning. Take with or immediately following a meal.     oxyCODONE-acetaminophen 5-325 MG per tablet  Commonly known as:  ROXICET  Take 1-2 tablets by mouth every 4 (four) hours as needed for pain.     rosuvastatin 5 MG tablet  Commonly known as:  CRESTOR  Take 5 mg by mouth every Monday. Monday     tamsulosin 0.4 MG Caps capsule  Commonly known as:  FLOMAX  Take 1 capsule (0.4 mg total) by mouth daily.     warfarin 5 MG tablet  Commonly known as:  COUMADIN  Take 5-7.5 mg by mouth daily. Pt to take 5 mg daily on Monday and Wednesday and Friday. pt take 7.5 mg tablet ( 1.5 tablet) the remainder of week-  States will stop coumadin 05/19/13 and begin Lovonox 05/20/13     zolpidem 10 MG tablet  Commonly known  as:  AMBIEN  Take 10 mg by mouth daily.           Follow-up Information   Follow up with Atilano Ina, MD On 06/09/2013. (11:30 AM)    Contact information:   [address] [place] [address] [phone]       Follow up with [facility], [facility]. Schedule an appointment as soon as possible for a visit in 3 days. (to get your coumadin/INR level checked)        The results of significant diagnostics from this hospitalization (including imaging, microbiology, ancillary and laboratory) are listed below for reference.    Significant Diagnostic Studies: No results found. CBG:  Recent Labs Lab 05/24/13 0622 05/24/13 2131 05/25/13 0714 05/25/13 1148  GLUCAP 101* 128* 120* 110*    Active Problems:   Atrial fibrillation   Diabetes mellitus   Hypertension   Urinary retention   Bilateral inguinal hernia (BIH)   Time coordinating discharge: 10 minutes  Signed:  Atilano Ina, MD [facility], [place] [phone] 05/26/2013, 9:38 AM

## 2013-05-29 ENCOUNTER — Encounter (HOSPITAL_COMMUNITY): Payer: Self-pay | Admitting: Emergency Medicine

## 2013-05-29 ENCOUNTER — Emergency Department (HOSPITAL_COMMUNITY)
Admission: EM | Admit: 2013-05-29 | Discharge: 2013-05-29 | Disposition: A | Discharge status: Home / Self Care | Payer: Medicare Other | Attending: Emergency Medicine | Admitting: Emergency Medicine

## 2013-05-29 ENCOUNTER — Emergency Department (HOSPITAL_COMMUNITY): Payer: Medicare Other

## 2013-05-29 DIAGNOSIS — Z9889 Other specified postprocedural states: Secondary | ICD-10-CM | POA: Insufficient documentation

## 2013-05-29 DIAGNOSIS — Z8719 Personal history of other diseases of the digestive system: Secondary | ICD-10-CM | POA: Insufficient documentation

## 2013-05-29 DIAGNOSIS — F3289 Other specified depressive episodes: Secondary | ICD-10-CM | POA: Insufficient documentation

## 2013-05-29 DIAGNOSIS — R109 Unspecified abdominal pain: Secondary | ICD-10-CM | POA: Insufficient documentation

## 2013-05-29 DIAGNOSIS — N419 Inflammatory disease of prostate, unspecified: Secondary | ICD-10-CM | POA: Insufficient documentation

## 2013-05-29 DIAGNOSIS — Z79899 Other long term (current) drug therapy: Secondary | ICD-10-CM | POA: Insufficient documentation

## 2013-05-29 DIAGNOSIS — B9689 Other specified bacterial agents as the cause of diseases classified elsewhere: Secondary | ICD-10-CM | POA: Insufficient documentation

## 2013-05-29 DIAGNOSIS — F329 Major depressive disorder, single episode, unspecified: Secondary | ICD-10-CM | POA: Insufficient documentation

## 2013-05-29 DIAGNOSIS — I4891 Unspecified atrial fibrillation: Secondary | ICD-10-CM | POA: Insufficient documentation

## 2013-05-29 DIAGNOSIS — Z8669 Personal history of other diseases of the nervous system and sense organs: Secondary | ICD-10-CM | POA: Insufficient documentation

## 2013-05-29 DIAGNOSIS — A499 Bacterial infection, unspecified: Secondary | ICD-10-CM | POA: Insufficient documentation

## 2013-05-29 DIAGNOSIS — Z9861 Coronary angioplasty status: Secondary | ICD-10-CM | POA: Insufficient documentation

## 2013-05-29 DIAGNOSIS — Z7901 Long term (current) use of anticoagulants: Secondary | ICD-10-CM | POA: Insufficient documentation

## 2013-05-29 DIAGNOSIS — I10 Essential (primary) hypertension: Secondary | ICD-10-CM | POA: Insufficient documentation

## 2013-05-29 DIAGNOSIS — F411 Generalized anxiety disorder: Secondary | ICD-10-CM | POA: Insufficient documentation

## 2013-05-29 DIAGNOSIS — N390 Urinary tract infection, site not specified: Secondary | ICD-10-CM | POA: Insufficient documentation

## 2013-05-29 DIAGNOSIS — E119 Type 2 diabetes mellitus without complications: Secondary | ICD-10-CM | POA: Insufficient documentation

## 2013-05-29 LAB — URINALYSIS, ROUTINE W REFLEX MICROSCOPIC
Glucose, UA: NEGATIVE mg/dL
Ketones, ur: NEGATIVE mg/dL
pH: 8 (ref 5.0–8.0)

## 2013-05-29 LAB — URINE MICROSCOPIC-ADD ON

## 2013-05-29 LAB — COMPREHENSIVE METABOLIC PANEL
Alkaline Phosphatase: 48 U/L (ref 39–117)
BUN: 16 mg/dL (ref 6–23)
CO2: 28 mEq/L (ref 19–32)
Chloride: 102 mEq/L (ref 96–112)
Creatinine, Ser: 1 mg/dL (ref 0.50–1.35)
GFR calc non Af Amer: 73 mL/min — ABNORMAL LOW (ref >90)
Glucose, Bld: 121 mg/dL — ABNORMAL HIGH (ref 70–99)
Total Bilirubin: 0.6 mg/dL (ref 0.3–1.2)

## 2013-05-29 LAB — CBC WITH DIFFERENTIAL/PLATELET
Basophils Relative: 0 % (ref 0–1)
HCT: 41 % (ref 39.0–52.0)
Hemoglobin: 14.1 g/dL (ref 13.0–17.0)
Lymphocytes Relative: 28 % (ref 12–46)
Lymphs Abs: 1.8 10*3/uL (ref 0.7–4.0)
MCHC: 34.4 g/dL (ref 30.0–36.0)
Monocytes Absolute: 0.7 10*3/uL (ref 0.1–1.0)
Monocytes Relative: 11 % (ref 3–12)
Neutro Abs: 3.8 10*3/uL (ref 1.7–7.7)
RBC: 4.76 MIL/uL (ref 4.22–5.81)

## 2013-05-29 MED ORDER — HYDROMORPHONE HCL PF 1 MG/ML IJ SOLN
INTRAMUSCULAR | Status: AC
  Administered 2013-05-29: 1 mg via INTRAVENOUS
  Filled 2013-05-29: qty 1

## 2013-05-29 MED ORDER — HYDROMORPHONE HCL PF 1 MG/ML IJ SOLN: 1.0000 mg | Freq: Once | INTRAMUSCULAR | Status: AC

## 2013-05-29 MED ORDER — IOHEXOL 300 MG/ML  SOLN
50.0000 mL | Freq: Once | INTRAMUSCULAR | Status: AC | PRN
  Administered 2013-05-29: 50 mL via INTRAVENOUS

## 2013-05-29 MED ORDER — IOHEXOL 300 MG/ML  SOLN
100.0000 mL | Freq: Once | INTRAMUSCULAR | Status: AC | PRN
  Administered 2013-05-29: 100 mL via INTRAVENOUS

## 2013-05-29 MED ORDER — CEPHALEXIN 500 MG PO CAPS: 500.0000 mg | ORAL_CAPSULE | Freq: Four times a day (QID) | ORAL | Status: DC

## 2013-05-29 MED ORDER — CEPHALEXIN 500 MG PO CAPS
500.0000 mg | ORAL_CAPSULE | Freq: Once | ORAL | Status: AC
  Administered 2013-05-29: 500 mg via ORAL
  Filled 2013-05-29: qty 1

## 2013-05-29 NOTE — ED Notes (Signed)
Pt was d/c from hospital on Wednesday, pt had double hernia repair. Pt was unable to urinate, so sent to urology, catheter placed, pt noticed today pt had no urine output. Groin pain 10/10.

## 2013-05-29 NOTE — ED Provider Notes (Signed)
CSN: [id number]     Arrival date & time 05/29/13  1255 History     First MD Initiated Contact with Patient 05/29/13 1409     Chief Complaint  Patient presents with  . Urinary Retention   (Consider location/radiation/quality/duration/timing/severity/associated sxs/prior Treatment) HPI Comments: Pt states that he had surgery 5 days ago and he had a cath put in post op because he was having a problem with urination:tp states that he was seen by urology 2 days ago and they replaced the foley because he still was having a hard time urinating;pt states that this morning he started to urinate around he catheter and it wasn't draining:pt states that he is having some lower abdominal pain:denies n/v/d, or fever  The history is provided by the patient. No language interpreter was used.    Past Medical History  Diagnosis Date  . Atrial fibrillation   . Diabetes mellitus without complication   . Hypertension   . Acoustic neuroma     deaf right side  . 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 Hernia   . Anxiety   . Depression    Past Surgical History  Procedure Laterality Date  . Brain surgery  02    acoustic neuroma  . Cholecystectomy  90's  . Hernia repair Bilateral 90's    inguinal, umbilical  . Retroperitoneal mass excision  03/31/2013  . Laparotomy N/A 03/31/2013    Procedure: Open resection of abdominal mass;  Surgeon: Almond Lint, MD;  Location: [facility] OR;  Service: General;  Laterality: N/A;  . Cardiac catheterization  1990  . Inguinal hernia repair Bilateral 05/24/2013    Procedure: bilateral open HERNIA REPAIR INGUINAL ADULT;  Surgeon: Atilano Ina, MD;  Location: [facility];  Service: General;  Laterality: Bilateral;  . Insertion of mesh Bilateral 05/24/2013    Procedure: INSERTION OF MESH;  Surgeon: Atilano Ina, MD;  Location: [facility];  Service: General;  Laterality: Bilateral;   No family history on file. History  Substance Use Topics  . Smoking status: Never Smoker   .  Smokeless tobacco: Never Used  . Alcohol Use: No    Review of Systems  Constitutional: Negative.   Respiratory: Negative.   Cardiovascular: Negative.     Allergies  Review of patient's allergies indicates no known allergies.  Home Medications   Current Outpatient Rx  Name  Route  Sig  Dispense  Refill  . amLODipine (NORVASC) 5 MG tablet   Oral   Take 2.5 mg by mouth every morning.          Marland Kitchen buPROPion (WELLBUTRIN) 100 MG tablet   Oral   Take 100 mg by mouth 2 (two) times daily.          Marland Kitchen diltiazem (CARDIZEM CD) 240 MG 24 hr capsule   Oral   Take 240 mg by mouth every morning.          . enoxaparin (LOVENOX) 40 MG/0.4ML injection   Subcutaneous   Inject 40 mg into the skin daily.         . fenofibrate (TRICOR) 145 MG tablet   Oral   Take 145 mg by mouth at bedtime.          . fish oil-omega-3 fatty acids 1000 MG capsule   Oral   Take 2 g by mouth 2 (two) times daily.          Marland Kitchen LORazepam (ATIVAN) 0.5 MG tablet   Oral  Take 0.5 mg by mouth 2 (two) times daily.         . metFORMIN (GLUCOPHAGE) 500 MG tablet   Oral   Take 1,000 mg by mouth daily before supper.         . metoprolol succinate (TOPROL-XL) 50 MG 24 hr tablet   Oral   Take 50 mg by mouth every morning. Take with or immediately following a meal.         . oxyCODONE-acetaminophen (ROXICET) 5-325 MG per tablet   Oral   Take 1-2 tablets by mouth every 4 (four) hours as needed for pain.   40 tablet   0   . rosuvastatin (CRESTOR) 5 MG tablet   Oral   Take 5 mg by mouth every Monday. Monday         . tamsulosin (FLOMAX) 0.4 MG CAPS capsule   Oral   Take 1 capsule (0.4 mg total) by mouth daily.   7 capsule   0   . warfarin (COUMADIN) 5 MG tablet   Oral   Take 5-7.5 mg by mouth every morning. Pt to take 7.5 mg daily on Monday and Wednesday and Friday. pt take 5mg  tablet the remainder of week         . zolpidem (AMBIEN) 10 MG tablet   Oral   Take 10 mg by mouth daily.            BP 124/79  Pulse 60  Temp(Src) 98.2 F (36.8 C) (Oral)  Resp 20  SpO2 100% Physical Exam  Nursing note and vitals reviewed. Constitutional: He is oriented to person, place, and time. He appears well-developed and well-nourished.  HENT:  Head: Normocephalic.  Eyes: Conjunctivae are normal.  Neck: Normal range of motion. Neck supple.  Cardiovascular: Normal rate and regular rhythm.   Pulmonary/Chest: Effort normal and breath sounds normal.  Abdominal: Soft.  Lower abdominal guarding and tenderness  Neurological: He is alert and oriented to person, place, and time.  Right sided facial droop  Skin:  Well healing abdominal scar  Psychiatric: He has a normal mood and affect.    ED Course   Procedures (including critical care time)  Labs Reviewed  URINALYSIS, ROUTINE W REFLEX MICROSCOPIC - Abnormal; Notable for the following:    Color, Urine RED (*)    APPearance TURBID (*)    Hgb urine dipstick LARGE (*)    Bilirubin Urine SMALL (*)    Protein, ur 100 (*)    Leukocytes, UA SMALL (*)    All other components within normal limits  COMPREHENSIVE METABOLIC PANEL - Abnormal; Notable for the following:    Potassium 3.2 (*)    Glucose, Bld 121 (*)    Albumin 3.2 (*)    GFR calc non Af Amer 73 (*)    GFR calc Af Amer 84 (*)    All other components within normal limits  URINE MICROSCOPIC-ADD ON - Abnormal; Notable for the following:    Bacteria, UA FEW (*)    All other components within normal limits  URINE CULTURE  CBC WITH DIFFERENTIAL   Ct Abdomen Pelvis W Contrast  05/29/2013   *RADIOLOGY REPORT*  Clinical Data: Recent double hernia repair.  Unable to urinate. Catheter placed.  Groin pain.  High blood pressure.  Prior cholecystectomy for  CT ABDOMEN AND PELVIS WITH CONTRAST  Technique:  Multidetector CT imaging of the abdomen and pelvis was performed following the standard protocol during bolus administration of intravenous contrast.  Contrast: OMNIPAQUE  IOHEXOL 300 MG/ML  SOLN  Comparison: 02/21/2013 MR.  02/14/2013 CT.  Findings: Fluid and air seen along the inguinal canal region with fatty/vessel containing hernias but without evidence of bowel containing hernia.  Findings may represent expected postoperative changes.  Given the early postoperative state, it is difficult to exclude postop complication of infection.  The small amount of fluid within the pelvis may be related to the recent surgery.  No definitive focal bowel inflammatory process or evidence of free intraperitoneal air.  Surgical resection of mass seen inferior to the aorta bifurcation region.  At this level, surgical clip and 2 cm collection is noted which may represent postoperative seroma.  Correlation with surgical pathology recommended.  Markedly enlarged prostate gland.  A Foley catheters been placed through the prostate gland into the decompressed urinary bladder which has slightly thickened walls.  Clinical and laboratory correlation recommended to exclude possibility of prostate malignancy.  Coronary artery calcifications.  Atherosclerotic type changes of the aorta with mild ectasia.  Atherosclerotic type changes iliac arteries with ectasia but without focal aneurysm.  Post cholecystectomy.  Mild prominence intrahepatic biliary ducts. Sub centimeter low density lesion right lobe liver unchanged.  No splenic or pancreatic or adrenal lesion.  Scarring left kidney.  Bilateral renal low density structures some which are cysts others are too small to adequately characterize.  Degenerative changes lower lumbar spine.  IMPRESSION:  Fluid and air seen along the inguinal canal region with fatty/vessel containing hernias but without evidence of bowel containing hernia.  Findings may represent expected postoperative changes.  Given the early postoperative state, it is difficult to exclude postop complication of infection.  The small amount of fluid within the pelvis may be related to the recent  surgery.  No definitive focal bowel inflammatory process or evidence of free intraperitoneal air.  Surgical resection of mass seen inferior to the aorta bifurcation region.  At this level, surgical clip and 2 cm collection is noted which may represent postoperative seroma.  Correlation with surgical pathology recommended.  Markedly enlarged prostate gland.  A Foley catheters been placed through the prostate gland into the decompressed urinary bladder which has slightly thickened walls.  Clinical and laboratory correlation recommended to exclude possibility of prostate malignancy.   Original Report Authenticated By: Lacy Duverney, M.D.   1. UTI (lower urinary tract infection)   2. Prostatitis     MDM  Discussed finding with pt and pt has appointments for follow up with urology and surgery:pt sent home in keflex for urine infection:urine sent for culture  Teressa Lower, NP 05/29/13 2021

## 2013-05-30 LAB — URINE CULTURE
Colony Count: NO GROWTH
Culture: NO GROWTH

## 2013-06-05 NOTE — ED Provider Notes (Signed)
Medical screening examination/treatment/procedure(s) were performed by non-physician practitioner and as supervising physician I was immediately available for consultation/collaboration.   Anallely Rosell M Jeramine Delis, MD 06/05/13 1427 

## 2013-06-09 ENCOUNTER — Encounter (INDEPENDENT_AMBULATORY_CARE_PROVIDER_SITE_OTHER): Payer: Medicare Other | Admitting: General Surgery

## 2013-06-16 ENCOUNTER — Ambulatory Visit (INDEPENDENT_AMBULATORY_CARE_PROVIDER_SITE_OTHER): Payer: Medicare Other | Admitting: General Surgery

## 2013-06-16 ENCOUNTER — Encounter (INDEPENDENT_AMBULATORY_CARE_PROVIDER_SITE_OTHER): Payer: Self-pay | Admitting: General Surgery

## 2013-06-16 VITALS — BP 124/84 | HR 87 | Temp 96.8°F | Resp 16 | Ht 72.0 in | Wt 160.6 lb

## 2013-06-16 DIAGNOSIS — Z09 Encounter for follow-up examination after completed treatment for conditions other than malignant neoplasm: Secondary | ICD-10-CM

## 2013-06-16 NOTE — Progress Notes (Signed)
Subjective:     Patient ID: Tommy Weeks, male   DOB: [birth year], 72 y.o.   MRN: [id number]  HPI 72 year old Caucasian male comes in today for his first postoperative appointment after undergoing open bilateral indirect inguinal hernias with mesh on August 5. He was kept overnight because of postoperative urinary retention. He ended up going home with an indwelling Foley catheter for which he received followup at [facility]. The Foley eventually came out and he was able to void on his own. He did end up going to the emergency room on August 10 for lower abdominal pain and urinary issues. He was diagnosed with prostatitis and was sent home on Keflex. A CT scan was also done. I reviewed it. There is no evidence of hernia recurrence. I think the air fluid levels in bilateral groins is primarily due to postoperative changes. He states that he is doing well now. He denies any fevers or chills. The pain that he had in his right groin preoperatively has resolved. He does still have some occasional soreness in his groin but it is very mild. He denies any difficulty urinating now. He denies any diarrhea or constipation. He denies any redness or drainage from his incisions.  He has already resumed his Coumadin. He has already had 3 INR checks at his primary care doctor's office Review of Systems     Objective:   Physical Exam BP 124/84  Pulse 87  Temp(Src) 96.8 F (36 C) (Temporal)  Resp 16  Ht 6' (1.829 m)  Wt 160 lb 9.6 oz (72.848 kg)  BMI 21.78 kg/m2  SpO2 98% Alert, no apparent distress Stable facial droop Abdomen-soft, nontender, nondistended. Well-healed midline incision GU-essentially healed bilateral groin incisions. No cellulitis or fluctuance. There is mild swelling underneath each incision however it is not consistent with a hematoma. There is no bruising. There is no inguinal hematoma. The testicles are descended. No evidence of hernia recurrence.    Assessment:     Status post  open repair of bilateral inguinal hernias with mesh  Postoperative urinary retention-resolved     Plan:     Overall I think he is doing well. It is unfortunate that he had urinary retention. He had after his abdominal exploratory surgery a few months ago. I reminded him he should not do any heavy lifting until September 16.  I reviewed his CT scan. I believe the findings are consistent with it being 5 days after surgery. Clinically there is no signs of infection.  Since he is doing well at this point he will followup as needed. He was instructed to call with any questions or concerns  [name]. Andrey Campanile, MD, FACS General, Bariatric, & Minimally Invasive Surgery [facility], [place]

## 2013-06-16 NOTE — Patient Instructions (Signed)
Call with any questions or concerns Can resume full activities after 9/16

## 2013-08-26 ENCOUNTER — Encounter (INDEPENDENT_AMBULATORY_CARE_PROVIDER_SITE_OTHER): Payer: Self-pay

## 2014-03-31 ENCOUNTER — Encounter (INDEPENDENT_AMBULATORY_CARE_PROVIDER_SITE_OTHER): Payer: Medicare Other | Admitting: Ophthalmology

## 2014-03-31 DIAGNOSIS — H2700 Aphakia, unspecified eye: Secondary | ICD-10-CM

## 2014-03-31 DIAGNOSIS — H35039 Hypertensive retinopathy, unspecified eye: Secondary | ICD-10-CM

## 2014-03-31 DIAGNOSIS — H43819 Vitreous degeneration, unspecified eye: Secondary | ICD-10-CM

## 2014-03-31 DIAGNOSIS — I10 Essential (primary) hypertension: Secondary | ICD-10-CM

## 2014-03-31 DIAGNOSIS — H35359 Cystoid macular degeneration, unspecified eye: Secondary | ICD-10-CM

## 2015-05-21 ENCOUNTER — Other Ambulatory Visit: Payer: Self-pay | Admitting: Urology

## 2015-06-01 ENCOUNTER — Encounter (HOSPITAL_BASED_OUTPATIENT_CLINIC_OR_DEPARTMENT_OTHER): Payer: Self-pay | Admitting: *Deleted

## 2015-06-04 ENCOUNTER — Encounter (HOSPITAL_BASED_OUTPATIENT_CLINIC_OR_DEPARTMENT_OTHER): Payer: Self-pay | Admitting: *Deleted

## 2015-06-05 ENCOUNTER — Encounter (HOSPITAL_BASED_OUTPATIENT_CLINIC_OR_DEPARTMENT_OTHER): Payer: Self-pay | Admitting: *Deleted

## 2015-06-05 NOTE — Progress Notes (Signed)
Pt instructed npo pmn Sunday 8/21 x norvasc, cardiazem, toprol, lorazepam,welbutrin w sip of water.  Pt aware to stop coumadin  And fish oil today.  To [facility] Fri 8/20 before 2 pm for pt, ptt.  To [facility] 8/22 @ 1000.  Needs istat on arrival.  LOVN, most recent ekg and cardiac studies requested from Dr. Mila Palmer office.

## 2015-06-08 DIAGNOSIS — I482 Chronic atrial fibrillation: Secondary | ICD-10-CM | POA: Diagnosis not present

## 2015-06-08 DIAGNOSIS — R972 Elevated prostate specific antigen [PSA]: Secondary | ICD-10-CM | POA: Diagnosis present

## 2015-06-08 DIAGNOSIS — H9191 Unspecified hearing loss, right ear: Secondary | ICD-10-CM | POA: Diagnosis not present

## 2015-06-08 DIAGNOSIS — Z5181 Encounter for therapeutic drug level monitoring: Secondary | ICD-10-CM | POA: Diagnosis not present

## 2015-06-08 DIAGNOSIS — E119 Type 2 diabetes mellitus without complications: Secondary | ICD-10-CM | POA: Diagnosis not present

## 2015-06-08 DIAGNOSIS — Z7901 Long term (current) use of anticoagulants: Secondary | ICD-10-CM | POA: Diagnosis not present

## 2015-06-08 DIAGNOSIS — Z79899 Other long term (current) drug therapy: Secondary | ICD-10-CM | POA: Diagnosis not present

## 2015-06-08 DIAGNOSIS — F419 Anxiety disorder, unspecified: Secondary | ICD-10-CM | POA: Diagnosis not present

## 2015-06-08 DIAGNOSIS — I10 Essential (primary) hypertension: Secondary | ICD-10-CM | POA: Diagnosis not present

## 2015-06-08 DIAGNOSIS — N433 Hydrocele, unspecified: Secondary | ICD-10-CM | POA: Diagnosis not present

## 2015-06-08 DIAGNOSIS — M1990 Unspecified osteoarthritis, unspecified site: Secondary | ICD-10-CM | POA: Diagnosis not present

## 2015-06-08 DIAGNOSIS — F329 Major depressive disorder, single episode, unspecified: Secondary | ICD-10-CM | POA: Diagnosis not present

## 2015-06-08 DIAGNOSIS — C61 Malignant neoplasm of prostate: Secondary | ICD-10-CM | POA: Diagnosis not present

## 2015-06-08 LAB — APTT: aPTT: 47 seconds — ABNORMAL HIGH (ref 24–37)

## 2015-06-08 LAB — PROTIME-INR
INR: 1.73 — ABNORMAL HIGH (ref 0.00–1.49)
Prothrombin Time: 20.3 seconds — ABNORMAL HIGH (ref 11.6–15.2)

## 2015-06-11 ENCOUNTER — Encounter (HOSPITAL_BASED_OUTPATIENT_CLINIC_OR_DEPARTMENT_OTHER): Payer: Self-pay | Admitting: *Deleted

## 2015-06-11 ENCOUNTER — Ambulatory Visit (HOSPITAL_BASED_OUTPATIENT_CLINIC_OR_DEPARTMENT_OTHER): Payer: Medicare Other | Admitting: Anesthesiology

## 2015-06-11 ENCOUNTER — Encounter (HOSPITAL_BASED_OUTPATIENT_CLINIC_OR_DEPARTMENT_OTHER)
Admission: RE | Disposition: A | Discharge status: Home / Self Care | Payer: Self-pay | Source: Ambulatory Visit | Attending: Urology

## 2015-06-11 ENCOUNTER — Ambulatory Visit (HOSPITAL_BASED_OUTPATIENT_CLINIC_OR_DEPARTMENT_OTHER)
Admission: RE | Admit: 2015-06-11 | Discharge: 2015-06-11 | Disposition: A | Discharge status: Home / Self Care | Payer: Medicare Other | Source: Ambulatory Visit | Attending: Urology | Admitting: Urology

## 2015-06-11 DIAGNOSIS — I10 Essential (primary) hypertension: Secondary | ICD-10-CM | POA: Insufficient documentation

## 2015-06-11 DIAGNOSIS — Z5181 Encounter for therapeutic drug level monitoring: Secondary | ICD-10-CM | POA: Insufficient documentation

## 2015-06-11 DIAGNOSIS — H9191 Unspecified hearing loss, right ear: Secondary | ICD-10-CM | POA: Insufficient documentation

## 2015-06-11 DIAGNOSIS — I482 Chronic atrial fibrillation: Secondary | ICD-10-CM | POA: Diagnosis not present

## 2015-06-11 DIAGNOSIS — M1990 Unspecified osteoarthritis, unspecified site: Secondary | ICD-10-CM | POA: Insufficient documentation

## 2015-06-11 DIAGNOSIS — F419 Anxiety disorder, unspecified: Secondary | ICD-10-CM | POA: Insufficient documentation

## 2015-06-11 DIAGNOSIS — Z79899 Other long term (current) drug therapy: Secondary | ICD-10-CM | POA: Insufficient documentation

## 2015-06-11 DIAGNOSIS — C61 Malignant neoplasm of prostate: Secondary | ICD-10-CM | POA: Insufficient documentation

## 2015-06-11 DIAGNOSIS — N433 Hydrocele, unspecified: Secondary | ICD-10-CM | POA: Diagnosis not present

## 2015-06-11 DIAGNOSIS — Z7901 Long term (current) use of anticoagulants: Secondary | ICD-10-CM | POA: Insufficient documentation

## 2015-06-11 DIAGNOSIS — F329 Major depressive disorder, single episode, unspecified: Secondary | ICD-10-CM | POA: Insufficient documentation

## 2015-06-11 DIAGNOSIS — E119 Type 2 diabetes mellitus without complications: Secondary | ICD-10-CM | POA: Insufficient documentation

## 2015-06-11 HISTORY — DX: Neurofibromatosis, type 2: Q85.02

## 2015-06-11 HISTORY — PX: TRANSRECTAL ULTRASOUND: SHX5146

## 2015-06-11 HISTORY — DX: Myoneural disorder, unspecified: G70.9

## 2015-06-11 HISTORY — DX: Cardiac arrhythmia, unspecified: I49.9

## 2015-06-11 HISTORY — DX: Personal history of other diseases of male genital organs: Z87.438

## 2015-06-11 HISTORY — DX: Hyperlipidemia, unspecified: E78.5

## 2015-06-11 HISTORY — DX: Adverse effect of unspecified anesthetic, initial encounter: T41.45XA

## 2015-06-11 HISTORY — DX: Personal history of other specified conditions: Z87.898

## 2015-06-11 HISTORY — DX: Unspecified hearing loss, right ear: H91.91

## 2015-06-11 HISTORY — DX: Malignant neoplasm of prostate: C61

## 2015-06-11 HISTORY — DX: Chronic atrial fibrillation, unspecified: I48.20

## 2015-06-11 HISTORY — DX: Other complications of anesthesia, initial encounter: T88.59XA

## 2015-06-11 HISTORY — DX: Type 2 diabetes mellitus without complications: E11.9

## 2015-06-11 LAB — POCT I-STAT, CHEM 8
BUN: 20 mg/dL (ref 6–20)
Calcium, Ion: 1.26 mmol/L (ref 1.13–1.30)
Chloride: 99 mmol/L — ABNORMAL LOW (ref 101–111)
Creatinine, Ser: 1.2 mg/dL (ref 0.61–1.24)
Glucose, Bld: 105 mg/dL — ABNORMAL HIGH (ref 65–99)
HCT: 44 % (ref 39.0–52.0)
Hemoglobin: 15 g/dL (ref 13.0–17.0)
Potassium: 4.1 mmol/L (ref 3.5–5.1)
Sodium: 138 mmol/L (ref 135–145)
TCO2: 24 mmol/L (ref 0–100)

## 2015-06-11 LAB — GLUCOSE, CAPILLARY: Glucose-Capillary: 108 mg/dL — ABNORMAL HIGH (ref 65–99)

## 2015-06-11 SURGERY — ULTRASOUND, RECTAL APPROACH
Anesthesia: Monitor Anesthesia Care | Site: Prostate

## 2015-06-11 MED ORDER — MIDAZOLAM HCL 5 MG/5ML IJ SOLN
INTRAMUSCULAR | Status: DC | PRN
  Administered 2015-06-11: 2 mg via INTRAVENOUS

## 2015-06-11 MED ORDER — PHENYLEPHRINE HCL 10 MG/ML IJ SOLN
INTRAMUSCULAR | Status: DC | PRN
  Administered 2015-06-11 (×3): 120 ug via INTRAVENOUS

## 2015-06-11 MED ORDER — LACTATED RINGERS IV SOLN
INTRAVENOUS | Status: DC
  Administered 2015-06-11: 11:00:00 via INTRAVENOUS
  Filled 2015-06-11: qty 1000

## 2015-06-11 MED ORDER — MIDAZOLAM HCL 2 MG/2ML IJ SOLN
INTRAMUSCULAR | Status: AC
  Filled 2015-06-11: qty 2

## 2015-06-11 MED ORDER — CEFTRIAXONE SODIUM 1 G IJ SOLR
1.0000 g | Freq: Once | INTRAMUSCULAR | Status: AC
  Administered 2015-06-11 (×2): 2 g via INTRAVENOUS
  Filled 2015-06-11: qty 10

## 2015-06-11 MED ORDER — EPHEDRINE SULFATE 50 MG/ML IJ SOLN
INTRAMUSCULAR | Status: DC | PRN
  Administered 2015-06-11: 15 mg via INTRAVENOUS

## 2015-06-11 MED ORDER — PROPOFOL 10 MG/ML IV BOLUS
INTRAVENOUS | Status: DC | PRN
  Administered 2015-06-11: 35 mg via INTRAVENOUS

## 2015-06-11 MED ORDER — FENTANYL CITRATE (PF) 100 MCG/2ML IJ SOLN
INTRAMUSCULAR | Status: DC | PRN
  Administered 2015-06-11: 50 ug via INTRAVENOUS

## 2015-06-11 MED ORDER — ONDANSETRON HCL 4 MG/2ML IJ SOLN
INTRAMUSCULAR | Status: DC | PRN
  Administered 2015-06-11: 4 mg via INTRAVENOUS

## 2015-06-11 MED ORDER — PROPOFOL 500 MG/50ML IV EMUL
INTRAVENOUS | Status: DC | PRN
  Administered 2015-06-11: 200 ug/kg/min via INTRAVENOUS

## 2015-06-11 MED ORDER — CEFTRIAXONE SODIUM 2 G IJ SOLR
INTRAMUSCULAR | Status: AC
  Filled 2015-06-11: qty 4

## 2015-06-11 MED ORDER — FENTANYL CITRATE (PF) 100 MCG/2ML IJ SOLN
INTRAMUSCULAR | Status: AC
  Filled 2015-06-11: qty 2

## 2015-06-11 MED ORDER — LIDOCAINE HCL (CARDIAC) 20 MG/ML IV SOLN
INTRAVENOUS | Status: DC | PRN
  Administered 2015-06-11: 50 mg via INTRAVENOUS

## 2015-06-11 SURGICAL SUPPLY — 7 items
GLOVE BIO SURGEON STRL SZ8 (GLOVE) IMPLANT
NDL SAFETY ECLIPSE 18X1.5 (NEEDLE) IMPLANT
NEEDLE HYPO 18GX1.5 SHARP (NEEDLE)
NEEDLE SPNL 22GX7 QUINCKE BK (NEEDLE) IMPLANT
SURGILUBE 2OZ TUBE FLIPTOP (MISCELLANEOUS) ×3 IMPLANT
SYR CONTROL 10ML LL (SYRINGE) IMPLANT
UNDERPAD 30X30 INCONTINENT (UNDERPADS AND DIAPERS) ×6 IMPLANT

## 2015-06-11 NOTE — Op Note (Signed)
PATIENT:  Tommy Weeks  PRE-OPERATIVE DIAGNOSIS: 1. History of adenocarcinoma the prostate. 2. Elevated PSA  POST-OPERATIVE DIAGNOSIS: Same  PROCEDURE: TRUS/BX  SURGEON:  Claybon Jabs  INDICATION: Tommy Weeks is a 74 year old male with a history of biopsy-proven low-grade adenocarcinoma of the prostate with a PSA that has now increased to 13.3. He had a great deal of discomfort with his previous biopsy and elected to undergo repeat prostate biopsy under intravenous sedation.  ANESTHESIA:  Intravenous sedation  Blood loss: None   DRAINS: None  LOCAL MEDICATIONS USED:  None  SPECIMEN:   all prostate biopsy core sent to pathology   Description of procedure: After informed consent the patient was taken to the operating room and placed on the table in a supine position. intravenous sedation was administered and the patient was positioned in the lateral decubitus position. An official timeout was then performed.   Procedure: Transrectal ultrasound of the prostate, Ultrasound Guided Prostate needle biopsy.  Indication: Elevated PSA.  Consent: Risks, benefits, and potential adverse events were discussed and informed consent was obtained from the patient.  Prep: Fleets enema.  Local Anesthesia: None Antibiotic prophylaxis: Levofloxacin, Ceftriaxone  Procedure Note:  The patient was placed in the lateral decubitus position. The 10 MHz transrectal ultrasound probe was placed into the rectum. Prostate width measured 6.73cm, height of 5.69cm and length of 6.39cm . Prostate volume was measured to be 128.16 cc. The prostate was homogenous without lesions on ultrasound. Calcifications were noted (right base ) . The seminal vesicles appeared normal. A median lobe was present. The biopsy cores were obtained using direct, real-time ultrasound guidance utilizing a standard 12-core pattern with one core from the right apex lateral using real time ultrasound guidance to direct the biopsy core  being taken from this location, right apex medial using real time ultrasound guidance to direct the biopsy core being taken from this location, left apex lateral using real time ultrasound guidance to direct the biopsy core being taken from this location, left apex medial using real time ultrasound guidance to direct the biopsy core being taken from this location, right mid lateral using real time ultrasound guidance to direct the biopsy core being taken from this location, right mid medial using real time ultrasound guidance to direct the biopsy core being taken from this location, left mid lateral using real time ultrasound guidance to direct the biopsy core being taken from this location, left mid medial using real time ultrasound guidance to direct the biopsy core being taken from this location, right base lateral using real time ultrasound guidance to direct the biopsy core being taken from this location, right base medial using real time ultrasound guidance to direct the biopsy core being taken from this location, left base lateral using real time ultrasound guidance to direct the biopsy core being taken from this location and left base medial of the prostate using real time ultrasound guidance to direct the biopsy core being taken from this location. The biopsy cores were placed in buffered formalin and sent to pathlogy.  Patient Status:. The patient tolerated the procedure well.  Complications:. None.  PLAN OF CARE: Discharge to home after PACU  PATIENT DISPOSITION:  PACU - hemodynamically stable.

## 2015-06-11 NOTE — Discharge Instructions (Signed)
DISCHARGE INSTRUCTIONS FOR PROSTATE BIOPSY  Antibiotics You may be given a prescription for an antibiotic to take when you arrive home. If so, be sure to take every tablet in the bottle, even if you are feeling better before the prescription is finished. If you begin itching, notice a rash or start to swell on your trunk, arms, legs and/or throat, immediately stop taking the antibiotic and call your Urologist. Diet Resume your usual diet when you return home. To keep your bowels moving easily and softly, drink prune, apple and cranberry juice at room temperature. You may also take a stool softener, such as Colace, which is available without prescription at local pharmacies. Daily activities ? No driving or heavy lifting for at least two days after the implant. ? Any strenuous physical activity should be approved by your doctor before you resume it.  Contact your doctor for ? Temperature greater than 101 F ? Increasing pain ? Inability to urinate ?  Follow-up  You should have follow up with your urologist 1-2 weeks after the procedure.     Post Anesthesia Home Care Instructions  Activity: Get plenty of rest for the remainder of the day. A responsible adult should stay with you for 24 hours following the procedure.  For the next 24 hours, DO NOT: -Drive a car -Paediatric nurse -Drink alcoholic beverages -Take any medication unless instructed by your physician -Make any legal decisions or sign important papers.  Meals: Start with liquid foods such as gelatin or soup. Progress to regular foods as tolerated. Avoid greasy, spicy, heavy foods. If nausea and/or vomiting occur, drink only clear liquids until the nausea and/or vomiting subsides. Call your physician if vomiting continues.  Special Instructions/Symptoms: Your throat may feel dry or sore from the anesthesia or the breathing tube placed in your throat during surgery. If this causes discomfort, gargle with warm salt water. The  discomfort should disappear within 24 hours.  If you had a scopolamine patch placed behind your ear for the management of post- operative nausea and/or vomiting:  1. The medication in the patch is effective for 72 hours, after which it should be removed.  Wrap patch in a tissue and discard in the trash. Wash hands thoroughly with soap and water. 2. You may remove the patch earlier than 72 hours if you experience unpleasant side effects which may include dry mouth, dizziness or visual disturbances. 3. Avoid touching the patch. Wash your hands with soap and water after contact with the patch.

## 2015-06-11 NOTE — Anesthesia Postprocedure Evaluation (Signed)
  Anesthesia Post-op Note  Patient: Tommy Weeks  Procedure(s) Performed: Procedure(s) (LRB): TRANSRECTAL ULTRASOUND AND BIOPSY (N/A)  Patient Location: PACU  Anesthesia Type: MAC  Level of Consciousness: awake and alert   Airway and Oxygen Therapy: Patient Spontanous Breathing  Post-op Pain: mild  Post-op Assessment: Post-op Vital signs reviewed, Patient's Cardiovascular Status Stable, Respiratory Function Stable, Patent Airway and No signs of Nausea or vomiting  Last Vitals:  Filed Vitals:   06/11/15 1230  BP: 82/60  Pulse: 87  Temp:   Resp: 21    Post-op Vital Signs: stable   Complications: No apparent anesthesia complications

## 2015-06-11 NOTE — Transfer of Care (Signed)
Immediate Anesthesia Transfer of Care Note  Patient: Tommy Weeks  Procedure(s) Performed: Procedure(s): TRANSRECTAL ULTRASOUND AND BIOPSY (N/A)  Patient Location: PACU  Anesthesia Type:General  Level of Consciousness: awake, alert , oriented and patient cooperative  Airway & Oxygen Therapy: Patient Spontanous Breathing and Patient connected to nasal cannula oxygen  Post-op Assessment: Report given to RN and Post -op Vital signs reviewed and stable  Post vital signs: Reviewed and stable  Last Vitals:  Filed Vitals:   06/11/15 1007  BP: 113/81  Pulse: 64  Temp: 36.3 C  Resp: 16    Complications: No apparent anesthesia complications

## 2015-06-11 NOTE — Anesthesia Procedure Notes (Signed)
Procedure Name: MAC Date/Time: 06/11/2015 11:35 AM Performed by: Wanita Chamberlain Pre-anesthesia Checklist: Patient identified, Timeout performed, Emergency Drugs available, Suction available and Patient being monitored Patient Re-evaluated:Patient Re-evaluated prior to inductionOxygen Delivery Method: Nasal cannula Placement Confirmation: positive ETCO2 and breath sounds checked- equal and bilateral

## 2015-06-11 NOTE — Anesthesia Preprocedure Evaluation (Addendum)
Anesthesia Evaluation  Patient identified by MRN, date of birth, ID band Patient awake    Reviewed: Allergy & Precautions, H&P , NPO status , Patient's Chart, lab work & pertinent test results, reviewed documented beta blocker date and time   Airway Mallampati: I  TM Distance: >3 FB Neck ROM: Full    Dental no notable dental hx. (+) Dental Advisory Given, Teeth Intact   Pulmonary neg pulmonary ROS,  breath sounds clear to auscultation  Pulmonary exam normal       Cardiovascular hypertension, Pt. on medications and Pt. on home beta blockers Normal cardiovascular exam+ dysrhythmias Atrial Fibrillation Rhythm:Irregular Rate:Abnormal  Atrial fibrillation   Neuro/Psych PSYCHIATRIC DISORDERS Anxiety Depression S/p excision of acoustic neuroma with Right facial droop after surgery    GI/Hepatic negative GI ROS, Neg liver ROS,   Endo/Other  negative endocrine ROSdiabetes, Type 2, Oral Hypoglycemic Agents  Renal/GU negative Renal ROS Bladder dysfunction  History of urinary retention    Musculoskeletal negative musculoskeletal ROS (+) Arthritis -, Osteoarthritis,    Abdominal   Peds  Hematology negative hematology ROS (+)   Anesthesia Other Findings   Reproductive/Obstetrics                         Anesthesia Physical  Anesthesia Plan  ASA: III  Anesthesia Plan: MAC   Post-op Pain Management:    Induction: Intravenous  Airway Management Planned: Nasal Cannula  Additional Equipment:   Intra-op Plan:   Post-operative Plan:   Informed Consent: I have reviewed the patients History and Physical, chart, labs and discussed the procedure including the risks, benefits and alternatives for the proposed anesthesia with the patient or authorized representative who has indicated his/her understanding and acceptance.   Dental advisory given  Plan Discussed with: CRNA and Anesthesiologist  Anesthesia  Plan Comments: (Discussed risks/benefits/alternatives to MAC sedation including need for ventilatory support, hypotension, need for conversion to general anesthesia.  All patient questions answered.  Patient wished to proceed.)     Anesthesia Quick Evaluation

## 2015-06-11 NOTE — H&P (Signed)
Tommy Weeks is a 74 year old male with a low risk prostate cancer.   History of Present Illness   History of right hydrocele: He underwent a right hydrocelectomy by Dr. Rosana Hoes in 2/09. He was noted that he had an asymptomatic, small left hydrocele that was not addressed at the time of his surgery.    Adenocarcinoma the prostate: He was found to have a PSA of 4.01 in 3/11. The patient felt that this was a slight increase from his previous PSA. A followup PSA later that same it was found to be 3.54.  CT 02/14/13 and MRI 02/21/13 revealed no evidence of pelvic adenopathy. He did have an enlarged prostate.   TRUS/BX 05/03/14: PSA at the time was 6.23. His prostate measured 135.4 cc.  Pathology: Adenocarcinoma Gleason score 3+3 = 6 in 2 cores from the left apex involving 5% of each core.  Treatment: Active surveillance    Left renal cyst: On his CT scan in 4/14 and MRI scan in 5/14 he was noted to have a simple cyst in the left kidney.    History of urinary retention: He underwent hernia repair and failed voiding trials in the hospital. He was placed on tamsulosin and eventually passed his voiding trial.  Current therapy: Tamsulosin 0.4 mg q.h.s.    Interval history: He is doing well with no new urologic complaints. He said he still gets up 2 or 3 times at night. He denies any bone pain or unexplained weight loss.  His prostate cancer would be considered of moderate severity with no modifying factors or associated signs and symptoms.        Past Medical History Problems  1. History of Anxiety (F41.9) 2. History of Atrial fibrillation (I48.91) 3. History of depression (Z86.59) 4. History of diabetes mellitus (Z86.39) 5. History of hypertension (Z86.79) 6. History of Hydrocele of testis (N43.3) 7. History of Inguinal hernia bilateral, non-recurrent (K40.20)  Surgical History Problems  1. History of Biopsy Of The Prostate Needle 2. History of Cataract Surgery 3. History of  Cholecystectomy 4. History of Craniotomy Tumor Removal - Complete 5. History of Hernia Repair Inguinal Bilateral 6. History of Surgery Tunica Vaginalis Excision Of Hydrocele Right  Current Meds 1. Cardizem TABS;  Therapy: (Recorded:27Jul2016) to Recorded 2. Coumadin 5 MG Oral Tablet;  Therapy: (Recorded:05Feb2013) to Recorded 3. Crestor 5 MG Oral Tablet;  Therapy: (Recorded:05Feb2013) to Recorded 4. LORazepam 1 MG Oral Tablet;  Therapy: (Recorded:05Feb2013) to Recorded 5. Lovaza CAPS;  Therapy: (Recorded:27Jul2016) to Recorded 6. MetFORMIN HCl - 500 MG Oral Tablet;  Therapy: (Recorded:05Feb2013) to Recorded 7. Mupirocin 2 % External Ointment;  Therapy: [id number] to Recorded 8. Norvasc 10 MG Oral Tablet; 1/2 tablet daily;  Therapy: (Recorded:05Feb2013) to Recorded 9. Omeprazole 20 MG Oral Capsule Delayed Release;  Therapy: [id number] to Recorded 10. Tamsulosin HCl - 0.4 MG Oral Capsule; TAKE 1 CAPSULE Daily;   Therapy: 12Aug2014 to (Evaluate:11Sep2014)  Requested for: 12Aug2014; Last   Rx:12Aug2014 Ordered 11. Toprol XL 50 MG TBCR;   Therapy: (Recorded:27Jul2016) to Recorded 12. Tricor 145 MG Oral Tablet;   Therapy: (Recorded:27Jul2016) to Recorded 13. Wellbutrin 100 MG Oral Tablet;   Therapy: (Recorded:05Feb2013) to Recorded  Allergies Medication  1. No Known Drug Allergies  Family History Problems  1. Family history of Death In The Family Mother   84-heart failure 2. Family history of Family Health Status Number Of Children   3 sons 3. Family history of Heart Disease : Mother  Social History Problems  1. Denied: Alcohol Use 2.  Caffeine Use   x1 daily 3. Marital History - Currently Married 4. Never A Smoker 5. Occupation:   retired Insurance underwriter 6. Denied: History of Tobacco Use  Family and social history were reviewed and are unchanged.   Review of Systems Genitourinary, constitutional, skin, eye, otolaryngeal, hematologic/lymphatic,  cardiovascular, pulmonary, endocrine, musculoskeletal, gastrointestinal, neurological and psychiatric system(s) were reviewed and pertinent findings if present are noted.     Vitals Vital Signs  Height: 6 ft  Weight: 162 lb  BMI Calculated: 21.97 BSA Calculated: 1.95 Blood Pressure: 106 / 67 Temperature: 97.9 F Heart Rate: 90  Physical Exam Constitutional: Well nourished and well developed . No acute distress.  ENT:. The ears and nose are normal in appearance.  Neck: The appearance of the neck is normal and no neck mass is present.  Pulmonary: No respiratory distress and normal respiratory rhythm and effort.  Cardiovascular: Heart rate and rhythm are normal . No peripheral edema.  Abdomen: lower midline, left inguinal, right inguinal incision site(s) well healed. The abdomen is soft and nontender. No masses are palpated. No CVA tenderness. No hernias are palpable.  A right inguinal hernia is present.  A left inguinal hernia is present. No hepatosplenomegaly noted.  Rectal: Rectal exam demonstrates normal sphincter tone, no tenderness and no masses. Estimated prostate size is 4+. The prostate has no nodularity and is not tender. The left seminal vesicle is nonpalpable. The right seminal vesicle is nonpalpable. The perineum is normal on inspection.  Genitourinary: Examination of the penis demonstrates no discharge, no masses, no lesions and a normal meatus. The scrotum is without lesions. The right epididymis is palpably normal and non-tender. The left epididymis is palpably normal and non-tender. The right testis is non-tender and without masses. The left testis is non-tender and without masses.  Lymphatics: The femoral and inguinal nodes are not enlarged or tender.  Skin: Normal skin turgor, no visible rash and no visible skin lesions.  Neuro/Psych:. Mood and affect are appropriate. He has right-sided facial droop.    Results/Data Test Name Result Flag Reference PSA 13.30  ng/mL H <=4.00 TEST METHODOLOGY: ECLIA PSA (ELECTROCHEMILUMINESCENCE IMMUNOASSAY)  Assessment    Although his DRE remains benign his PSA has increased further now to 13.30. He has a very large prostate but was smooth and free of any nodularity or induration. We therefore have discussed the options again including continued active surveillance with repeat DRE and PSA again in 6 months versus a repeat prostate biopsy. He had a lot of discomfort the last time he had his biopsy so we discussed performing a prostate biopsy under anesthesia if it is required. He had informed me previously that after his last 2 surgeries he developed urinary retention postoperatively so I told him we would place a Foley catheter and allowing him to remove the catheter following day at home after prostate biopsy under anesthesia. He is in agreement with this plan. He is on Coumadin and will need to be off of that for 5 days prior to the procedure and will need a Lovenox bridge.   Plan  1. Stop Coumadin with Lovenox bridge prior to biopsy.  2. He will be scheduled for TRUS/BX under anesthesia.

## 2015-06-12 ENCOUNTER — Encounter (HOSPITAL_BASED_OUTPATIENT_CLINIC_OR_DEPARTMENT_OTHER): Payer: Self-pay | Admitting: Urology

## 2015-07-04 ENCOUNTER — Encounter (HOSPITAL_COMMUNITY): Payer: Self-pay | Admitting: Certified Registered Nurse Anesthetist

## 2015-07-04 ENCOUNTER — Inpatient Hospital Stay (HOSPITAL_COMMUNITY)
Admission: AD | Admit: 2015-07-04 | Discharge: 2015-07-06 | DRG: 308 | Disposition: A | Discharge status: Home / Self Care | Payer: Medicare Other | Source: Ambulatory Visit | Attending: Internal Medicine | Admitting: Internal Medicine

## 2015-07-04 DIAGNOSIS — C716 Malignant neoplasm of cerebellum: Secondary | ICD-10-CM | POA: Diagnosis present

## 2015-07-04 DIAGNOSIS — R42 Dizziness and giddiness: Secondary | ICD-10-CM

## 2015-07-04 DIAGNOSIS — E871 Hypo-osmolality and hyponatremia: Secondary | ICD-10-CM | POA: Diagnosis present

## 2015-07-04 DIAGNOSIS — Z7901 Long term (current) use of anticoagulants: Secondary | ICD-10-CM

## 2015-07-04 DIAGNOSIS — E119 Type 2 diabetes mellitus without complications: Secondary | ICD-10-CM | POA: Diagnosis present

## 2015-07-04 DIAGNOSIS — I4891 Unspecified atrial fibrillation: Secondary | ICD-10-CM | POA: Diagnosis present

## 2015-07-04 DIAGNOSIS — D649 Anemia, unspecified: Secondary | ICD-10-CM | POA: Diagnosis present

## 2015-07-04 DIAGNOSIS — I482 Chronic atrial fibrillation: Secondary | ICD-10-CM | POA: Diagnosis present

## 2015-07-04 DIAGNOSIS — E43 Unspecified severe protein-calorie malnutrition: Secondary | ICD-10-CM | POA: Insufficient documentation

## 2015-07-04 DIAGNOSIS — Z8546 Personal history of malignant neoplasm of prostate: Secondary | ICD-10-CM

## 2015-07-04 DIAGNOSIS — E785 Hyperlipidemia, unspecified: Secondary | ICD-10-CM | POA: Diagnosis present

## 2015-07-04 DIAGNOSIS — Z79899 Other long term (current) drug therapy: Secondary | ICD-10-CM | POA: Diagnosis not present

## 2015-07-04 DIAGNOSIS — I10 Essential (primary) hypertension: Secondary | ICD-10-CM | POA: Diagnosis not present

## 2015-07-04 DIAGNOSIS — Z6820 Body mass index (BMI) 20.0-20.9, adult: Secondary | ICD-10-CM

## 2015-07-04 LAB — COMPREHENSIVE METABOLIC PANEL
ALBUMIN: 2.8 g/dL — AB (ref 3.5–5.0)
ALT: 13 U/L — ABNORMAL LOW (ref 17–63)
AST: 25 U/L (ref 15–41)
Alkaline Phosphatase: 61 U/L (ref 38–126)
Anion gap: 8 (ref 5–15)
BUN: 18 mg/dL (ref 6–20)
CHLORIDE: 98 mmol/L — AB (ref 101–111)
CO2: 28 mmol/L (ref 22–32)
Calcium: 9.5 mg/dL (ref 8.9–10.3)
Creatinine, Ser: 1.11 mg/dL (ref 0.61–1.24)
GFR calc Af Amer: >60 mL/min (ref >60)
GFR calc non Af Amer: >60 mL/min (ref >60)
GLUCOSE: 110 mg/dL — AB (ref 65–99)
POTASSIUM: 4.1 mmol/L (ref 3.5–5.1)
SODIUM: 134 mmol/L — AB (ref 135–145)
Total Bilirubin: 0.6 mg/dL (ref 0.3–1.2)
Total Protein: 6 g/dL — ABNORMAL LOW (ref 6.5–8.1)

## 2015-07-04 LAB — CBC
HEMATOCRIT: 38.4 % — AB (ref 39.0–52.0)
Hemoglobin: 12.5 g/dL — ABNORMAL LOW (ref 13.0–17.0)
MCH: 26.7 pg (ref 26.0–34.0)
MCHC: 32.6 g/dL (ref 30.0–36.0)
MCV: 81.9 fL (ref 78.0–100.0)
Platelets: 294 10*3/uL (ref 150–400)
RBC: 4.69 MIL/uL (ref 4.22–5.81)
RDW: 15 % (ref 11.5–15.5)
WBC: 5.3 10*3/uL (ref 4.0–10.5)

## 2015-07-04 LAB — GLUCOSE, CAPILLARY
GLUCOSE-CAPILLARY: 114 mg/dL — AB (ref 65–99)
GLUCOSE-CAPILLARY: 89 mg/dL (ref 65–99)
Glucose-Capillary: 118 mg/dL — ABNORMAL HIGH (ref 65–99)
Glucose-Capillary: 94 mg/dL (ref 65–99)

## 2015-07-04 LAB — MRSA PCR SCREENING: MRSA by PCR: NEGATIVE

## 2015-07-04 LAB — PROTIME-INR
INR: 3.06 — ABNORMAL HIGH (ref 0.00–1.49)
Prothrombin Time: 31.1 seconds — ABNORMAL HIGH (ref 11.6–15.2)

## 2015-07-04 LAB — TSH: TSH: 1.654 u[IU]/mL (ref 0.350–4.500)

## 2015-07-04 LAB — APTT: aPTT: 54 seconds — ABNORMAL HIGH (ref 24–37)

## 2015-07-04 LAB — TROPONIN I

## 2015-07-04 MED ORDER — ACETAMINOPHEN 650 MG RE SUPP: 650.0000 mg | Freq: Four times a day (QID) | RECTAL | Status: DC | PRN

## 2015-07-04 MED ORDER — ACETAMINOPHEN 325 MG PO TABS: 650.0000 mg | ORAL_TABLET | Freq: Four times a day (QID) | ORAL | Status: DC | PRN

## 2015-07-04 MED ORDER — ZOLPIDEM TARTRATE 5 MG PO TABS: 10.0000 mg | ORAL_TABLET | Freq: Every day | ORAL | Status: DC

## 2015-07-04 MED ORDER — METOPROLOL SUCCINATE ER 25 MG PO TB24
25.0000 mg | ORAL_TABLET | Freq: Every morning | ORAL | Status: DC
  Administered 2015-07-04 – 2015-07-05 (×2): 25 mg via ORAL
  Filled 2015-07-04 (×3): qty 1

## 2015-07-04 MED ORDER — OMEGA-3-ACID ETHYL ESTERS 1 G PO CAPS
2.0000 g | ORAL_CAPSULE | Freq: Two times a day (BID) | ORAL | Status: DC
  Administered 2015-07-04 – 2015-07-05 (×3): 2 g via ORAL
  Filled 2015-07-04 (×4): qty 2

## 2015-07-04 MED ORDER — SODIUM CHLORIDE 0.9 % IV SOLN
INTRAVENOUS | Status: AC
  Administered 2015-07-04: 13:00:00 via INTRAVENOUS

## 2015-07-04 MED ORDER — WARFARIN - PHARMACIST DOSING INPATIENT: Freq: Every day | Status: DC

## 2015-07-04 MED ORDER — METFORMIN HCL 500 MG PO TABS
1000.0000 mg | ORAL_TABLET | Freq: Every day | ORAL | Status: DC
  Administered 2015-07-04 – 2015-07-05 (×2): 1000 mg via ORAL
  Filled 2015-07-04 (×2): qty 2

## 2015-07-04 MED ORDER — AMLODIPINE BESYLATE 5 MG PO TABS
5.0000 mg | ORAL_TABLET | Freq: Every morning | ORAL | Status: DC
  Administered 2015-07-05: 5 mg via ORAL
  Filled 2015-07-04 (×2): qty 1

## 2015-07-04 MED ORDER — OMEGA-3 FATTY ACIDS 1000 MG PO CAPS: 2.0000 g | ORAL_CAPSULE | Freq: Two times a day (BID) | ORAL | Status: DC

## 2015-07-04 MED ORDER — LORAZEPAM 0.5 MG PO TABS
0.5000 mg | ORAL_TABLET | Freq: Two times a day (BID) | ORAL | Status: DC
  Administered 2015-07-04 – 2015-07-05 (×3): 0.5 mg via ORAL
  Filled 2015-07-04 (×4): qty 1

## 2015-07-04 MED ORDER — INSULIN ASPART 100 UNIT/ML ~~LOC~~ SOLN: 0.0000 [IU] | Freq: Three times a day (TID) | SUBCUTANEOUS | Status: DC

## 2015-07-04 MED ORDER — METOPROLOL TARTRATE 1 MG/ML IV SOLN
2.5000 mg | Freq: Once | INTRAVENOUS | Status: AC
  Administered 2015-07-04: 2.5 mg via INTRAVENOUS
  Filled 2015-07-04: qty 5

## 2015-07-04 MED ORDER — ROSUVASTATIN CALCIUM 10 MG PO TABS: 5.0000 mg | ORAL_TABLET | ORAL | Status: DC

## 2015-07-04 MED ORDER — TAMSULOSIN HCL 0.4 MG PO CAPS
0.4000 mg | ORAL_CAPSULE | Freq: Every day | ORAL | Status: DC
  Administered 2015-07-04 – 2015-07-05 (×2): 0.4 mg via ORAL
  Filled 2015-07-04 (×2): qty 1

## 2015-07-04 MED ORDER — DILTIAZEM HCL ER COATED BEADS 240 MG PO CP24
240.0000 mg | ORAL_CAPSULE | Freq: Every morning | ORAL | Status: DC
  Administered 2015-07-05: 240 mg via ORAL
  Filled 2015-07-04 (×2): qty 1

## 2015-07-04 MED ORDER — SODIUM CHLORIDE 0.9 % IJ SOLN
3.0000 mL | Freq: Two times a day (BID) | INTRAMUSCULAR | Status: DC
  Administered 2015-07-04 – 2015-07-05 (×4): 3 mL via INTRAVENOUS

## 2015-07-04 MED ORDER — INFLUENZA VAC SPLIT QUAD 0.5 ML IM SUSY
0.5000 mL | PREFILLED_SYRINGE | INTRAMUSCULAR | Status: AC
  Administered 2015-07-05: 0.5 mL via INTRAMUSCULAR
  Filled 2015-07-04: qty 0.5

## 2015-07-04 MED ORDER — BUPROPION HCL 100 MG PO TABS
100.0000 mg | ORAL_TABLET | Freq: Two times a day (BID) | ORAL | Status: DC
  Administered 2015-07-04 – 2015-07-05 (×3): 100 mg via ORAL
  Filled 2015-07-04 (×7): qty 1

## 2015-07-04 MED ORDER — ENSURE ENLIVE PO LIQD
237.0000 mL | Freq: Two times a day (BID) | ORAL | Status: DC
  Administered 2015-07-04 – 2015-07-05 (×3): 237 mL via ORAL

## 2015-07-04 MED ORDER — FENOFIBRATE 160 MG PO TABS
160.0000 mg | ORAL_TABLET | Freq: Every day | ORAL | Status: DC
  Administered 2015-07-04 – 2015-07-05 (×2): 160 mg via ORAL
  Filled 2015-07-04 (×2): qty 1

## 2015-07-04 MED ORDER — INSULIN ASPART 100 UNIT/ML ~~LOC~~ SOLN: 0.0000 [IU] | Freq: Every day | SUBCUTANEOUS | Status: DC

## 2015-07-04 NOTE — Progress Notes (Signed)
ANTICOAGULATION CONSULT NOTE - Initial Consult  Pharmacy Consult for Warfarin Indication: atrial fibrillation  No Known Allergies  Patient Measurements: Height: 6' (182.9 cm) Weight: 150 lb (68.04 kg) IBW/kg (Calculated) : 77.6   Vital Signs: BP: 118/73 mmHg (09/14 1400) Pulse Rate: 78 (09/14 1400)  Labs:  Recent Labs  07/04/15 1331  HGB 12.5*  HCT 38.4*  PLT 294  APTT 54*  LABPROT 31.1*  INR 3.06*  CREATININE 1.11    Estimated Creatinine Clearance: 55.3 mL/min (by C-G formula based on Cr of 1.11).   Medical History: Past Medical History  Diagnosis Date  . Hypertension   . 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 Anxiety   . Depression   . 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 Prostate cancer   . History of urinary retention   . History of prostatitis   . Chronic atrial fibrillation   . Dysrhythmia   . Type 2 diabetes mellitus     NIDDM  . Complication of anesthesia     urinary retention  . Hyperlipidemia   . Neuromuscular disorder     imbalance s/p craniectomy      Assessment: 73yom admitted with Hx Afib on diltiazem, and metoprol.  HR stable 75, BP elevated on admission but dropped with administration of metoprolol.   Plan to continue anticoagulation with Warfarin - took dose PTA - admission INR 3.04 at goal.  Warfarin home dose 7.5mg  daily  Goal of Therapy:  INR 2-3 Monitor platelets by anticoagulation protocol: Yes   Plan:  No warfarin today - already took dose  Daily INR Resume home dose 9/15  Bonnita Nasuti Pharm.D. CPP, BCPS Clinical Pharmacist [phone] 07/04/2015 3:19 PM

## 2015-07-04 NOTE — H&P (Addendum)
Tommy Weeks is an 74 y.o. male.    Pcp: Jani Gravel   Chief Complaint: Afib with rvr HPI: 74 yo male with htn, atrial fibrillation, hx of acoustic neuroma s/p resection, apparently has had vertigo x 1 week,  Some n/v, intermittently.  Pt denies headache, focal neurological symptoms,  Generally feeling weak. Pt noted not taking metoprolol x 3 days.   Pt presented to office and found to be in Afib with rvr, hr 130,  Pt sent for direct admission for vertigo and afib with rvr.  Pt denies cp, palp, sob, lower ext edema.   Past Medical History  Diagnosis Date  . Hypertension   . 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 Anxiety   . Depression   . 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 Prostate cancer   . History of urinary retention   . History of prostatitis   . Chronic atrial fibrillation   . Dysrhythmia   . Type 2 diabetes mellitus     NIDDM  . Complication of anesthesia     urinary retention  . Hyperlipidemia   . Neuromuscular disorder     imbalance s/p craniectomy    Past Surgical History  Procedure Laterality Date  . Cholecystectomy  90's  . Retroperitoneal mass excision  03/31/2013  . Laparotomy N/A 03/31/2013    Procedure: Open resection of abdominal mass;  Surgeon: Stark Klein, MD;  Location: [facility];  Service: General;  Laterality: N/A;  . Cardiac catheterization  1990  . Inguinal hernia repair Bilateral 05/24/2013    Procedure: bilateral open HERNIA REPAIR INGUINAL ADULT;  Surgeon: Gayland Curry, MD;  Location: [facility];  Service: General;  Laterality: Bilateral;  . Insertion of mesh Bilateral 05/24/2013    Procedure: INSERTION OF MESH;  Surgeon: Gayland Curry, MD;  Location: [facility];  Service: General;  Laterality: Bilateral;  . Umbilical hernia repair  1990's  . Craniectomy for excision of acoustic neuroma  Right 02-12-2001  . Hydrocele excision Right 11-26-2007  . Exploration face right side  11-29-2001  &  02-06-2003    post craniotomy resection acoustic neuroma--  facial paralysis  . Transthoracic echocardiogram  05-11-2007  (per PCP note)    ef 55-65%,  mild TR and MR  . Eye surgery Bilateral     cataract OU w/ IOL  . Transrectal ultrasound N/A 06/11/2015    Procedure: TRANSRECTAL ULTRASOUND AND BIOPSY;  Surgeon: Kathie Rhodes, MD;  Location: [facility];  Service: Urology;  Laterality: N/A;    History reviewed. No pertinent family history. Social History:  reports that he has never smoked. He has never used smokeless tobacco. He reports that he does not drink alcohol or use illicit drugs.  Allergies: No Known Allergies  Medications Prior to Admission  Medication Sig Dispense Refill  . buPROPion (WELLBUTRIN) 100 MG tablet Take 100 mg by mouth 2 (two) times daily.     Marland Kitchen diltiazem (CARDIZEM CD) 240 MG 24 hr capsule Take 240 mg by mouth every morning.     . fenofibrate (TRICOR) 145 MG tablet Take 145 mg by mouth at bedtime.     . fish oil-omega-3 fatty acids 1000 MG capsule Take 2 g by mouth 2 (two) times daily.     Marland Kitchen  LORazepam (ATIVAN) 0.5 MG tablet Take 0.5 mg by mouth 2 (two) times daily.    . metFORMIN (GLUCOPHAGE) 500 MG tablet Take 1,000 mg by mouth daily before supper.    . rosuvastatin (CRESTOR) 5 MG tablet Take 5 mg by mouth every Monday. Monday    . tamsulosin (FLOMAX) 0.4 MG CAPS capsule Take 1 capsule (0.4 mg total) by mouth daily. (Patient taking differently: Take 0.4 mg by mouth at bedtime. ) 7 capsule 0  . warfarin (COUMADIN) 5 MG tablet Take 7.5 mg by mouth daily.     Marland Kitchen amLODipine (NORVASC) 5 MG tablet Take 5 mg by mouth every morning.     . metoprolol succinate (TOPROL-XL) 50 MG 24 hr tablet Take 50 mg by mouth every morning. Take with or immediately following a meal.    . [DISCONTINUED] zolpidem (AMBIEN) 10 MG tablet Take 10 mg by mouth daily.        Results for orders placed or performed during the hospital encounter of 07/04/15 (from the past 48 hour(s))  MRSA PCR Screening     Status: None   Collection Time: 07/04/15 11:59 AM  Result Value Ref Range   MRSA by PCR NEGATIVE NEGATIVE    Comment:        The GeneXpert MRSA Assay (FDA approved for NASAL specimens only), is one component of a comprehensive MRSA colonization surveillance program. It is not intended to diagnose MRSA infection nor to guide or monitor treatment for MRSA infections.   Glucose, capillary     Status: Abnormal   Collection Time: 07/04/15 12:59 PM  Result Value Ref Range   Glucose-Capillary 118 (H) 65 - 99 mg/dL  Glucose, capillary     Status: Abnormal   Collection Time: 07/04/15  1:02 PM  Result Value Ref Range   Glucose-Capillary 114 (H) 65 - 99 mg/dL   Comment 1 Capillary Specimen   Comprehensive metabolic panel     Status: Abnormal   Collection Time: 07/04/15  1:31 PM  Result Value Ref Range   Sodium 134 (L) 135 - 145 mmol/L   Potassium 4.1 3.5 - 5.1 mmol/L   Chloride 98 (L) 101 - 111 mmol/L   CO2 28 22 - 32 mmol/L   Glucose, Bld 110 (H) 65 - 99 mg/dL   BUN 18 6 - 20 mg/dL   Creatinine, Ser 1.11 0.61 - 1.24 mg/dL   Calcium 9.5 8.9 - 10.3 mg/dL   Total Protein 6.0 (L) 6.5 - 8.1 g/dL   Albumin 2.8 (L) 3.5 - 5.0 g/dL   AST 25 15 - 41 U/L   ALT 13 (L) 17 - 63 U/L   Alkaline Phosphatase 61 38 - 126 U/L   Total Bilirubin 0.6 0.3 - 1.2 mg/dL   GFR calc non Af Amer >60 >60 mL/min   GFR calc Af Amer >60 >60 mL/min    Comment: (NOTE) The eGFR has been calculated using the CKD EPI equation. This calculation has not been validated in all clinical situations. eGFR's persistently <60 mL/min signify possible Chronic Kidney Disease.    Anion gap 8 5 - 15  CBC     Status: Abnormal   Collection Time: 07/04/15  1:31 PM  Result Value Ref Range   WBC 5.3 4.0 - 10.5 K/uL   RBC 4.69 4.22 - 5.81 MIL/uL   Hemoglobin 12.5 (L) 13.0 - 17.0 g/dL   HCT  38.4 (L) 39.0 - 52.0 %   MCV 81.9 78.0 - 100.0 fL   MCH 26.7  26.0 - 34.0 pg   MCHC 32.6 30.0 - 36.0 g/dL   RDW 15.0 11.5 - 15.5 %   Platelets 294 150 - 400 K/uL  APTT     Status: Abnormal   Collection Time: 07/04/15  1:31 PM  Result Value Ref Range   aPTT 54 (H) 24 - 37 seconds    Comment:        IF BASELINE aPTT IS ELEVATED, SUGGEST PATIENT RISK ASSESSMENT BE USED TO DETERMINE APPROPRIATE ANTICOAGULANT THERAPY.   Protime-INR     Status: Abnormal   Collection Time: 07/04/15  1:31 PM  Result Value Ref Range   Prothrombin Time 31.1 (H) 11.6 - 15.2 seconds   INR 3.06 (H) 0.00 - 1.49  TSH     Status: None   Collection Time: 07/04/15  1:31 PM  Result Value Ref Range   TSH 1.654 0.350 - 4.500 uIU/mL  Glucose, capillary     Status: None   Collection Time: 07/04/15  4:09 PM  Result Value Ref Range   Glucose-Capillary 94 65 - 99 mg/dL   Comment 1 Capillary Specimen    No results found.  Review of Systems  Constitutional: Negative.   HENT: Negative.   Eyes: Negative.   Respiratory: Negative.   Cardiovascular: Negative.   Gastrointestinal: Negative.   Genitourinary: Negative.   Musculoskeletal: Negative.   Skin: Negative.   Neurological: Positive for dizziness.       + vertigo  Endo/Heme/Allergies: Negative.   Psychiatric/Behavioral: Negative.     Blood pressure 108/73, pulse 73, temperature 98.4 F (36.9 C), temperature source Oral, resp. rate 21, height 6' (1.829 m), weight 68.04 kg (150 lb), SpO2 98 %. Physical Exam  Constitutional: He is oriented to person, place, and time. He appears well-developed and well-nourished.  HENT:  Head: Normocephalic and atraumatic.  Eyes: Conjunctivae and EOM are normal. Pupils are equal, round, and reactive to light. No scleral icterus.  Neck: Normal range of motion. Neck supple. No JVD present. No tracheal deviation present. No thyromegaly present.  Cardiovascular: Exam reveals no gallop and no friction rub.   No murmur heard. Irr,  irr, s1, s2  Respiratory: Effort normal and breath sounds normal. No respiratory distress. He has no wheezes. He has no rales.  GI: Soft. Bowel sounds are normal. He exhibits no distension. There is no tenderness. There is no rebound and no guarding.  Musculoskeletal: Normal range of motion. He exhibits no edema or tenderness.  Lymphadenopathy:    He has no cervical adenopathy.  Neurological: He is alert and oriented to person, place, and time. He has normal reflexes. He displays normal reflexes. No cranial nerve deficit. He exhibits normal muscle tone. Coordination normal.  Skin: Skin is warm and dry. No rash noted. No erythema. No pallor.  Psychiatric: He has a normal mood and affect. His behavior is normal. Judgment and thought content normal.     Assessment/Plan Afib with rvr Check cbc, cmp, tsh, trop i now and in am ekg 12 lead Check cardiac echo Cont coumadin pharmacy to dose Metoprolol 2.103m iv x1  Vertigo MRI brain without contrast  Hyponatremia Hydrate gently with ns iv  Anemia Check cbc in am  Dm2 fsbs ac and qhs, iss  DVT prophylaxis: scd,  Cont coumadin   Please call Dr. JJani [phone] any questions, thanks  KVINAYAK, BOBIER9/14/2016, 5:34 PM

## 2015-07-05 ENCOUNTER — Inpatient Hospital Stay (HOSPITAL_COMMUNITY): Payer: Medicare Other

## 2015-07-05 DIAGNOSIS — I4891 Unspecified atrial fibrillation: Secondary | ICD-10-CM

## 2015-07-05 DIAGNOSIS — I482 Chronic atrial fibrillation: Secondary | ICD-10-CM | POA: Diagnosis not present

## 2015-07-05 LAB — GLUCOSE, CAPILLARY
GLUCOSE-CAPILLARY: 93 mg/dL (ref 65–99)
GLUCOSE-CAPILLARY: 98 mg/dL (ref 65–99)
Glucose-Capillary: 92 mg/dL (ref 65–99)
Glucose-Capillary: 99 mg/dL (ref 65–99)

## 2015-07-05 LAB — HEMOGLOBIN A1C
Hgb A1c MFr Bld: 5.7 % — ABNORMAL HIGH (ref 4.8–5.6)
Mean Plasma Glucose: 117 mg/dL

## 2015-07-05 LAB — PROTIME-INR
INR: 3.14 — ABNORMAL HIGH (ref 0.00–1.49)
PROTHROMBIN TIME: 31.7 s — AB (ref 11.6–15.2)

## 2015-07-05 LAB — TROPONIN I

## 2015-07-05 MED ORDER — GADOBENATE DIMEGLUMINE 529 MG/ML IV SOLN
15.0000 mL | Freq: Once | INTRAVENOUS | Status: AC | PRN
  Administered 2015-07-05: 15 mL via INTRAVENOUS

## 2015-07-05 MED ORDER — MECLIZINE HCL 25 MG PO TABS: 25.0000 mg | ORAL_TABLET | Freq: Four times a day (QID) | ORAL | Status: DC | PRN

## 2015-07-05 MED ORDER — WARFARIN SODIUM 2 MG PO TABS
6.0000 mg | ORAL_TABLET | Freq: Once | ORAL | Status: AC
  Administered 2015-07-05: 6 mg via ORAL
  Filled 2015-07-05: qty 3

## 2015-07-05 NOTE — Progress Notes (Signed)
Utilization Review Completed.Donne Anon T9/15/2016

## 2015-07-05 NOTE — Progress Notes (Signed)
Subjective: Vertigo:  Pt does not feel like needs medication at this time. MRI showed recurrent accoustic neuroma.  Neurosurgery consultation requested this am Afib with rvr:  Pt feels better now that hr slower.  Trop negative.  Denies cp, palp, sob, n/v, lower ext edema.  Dm2 bs  <140  No low bs.   Objective: Vital signs in last 24 hours: Temp:  [97.7 F (36.5 C)-98.4 F (36.9 C)] 97.8 F (36.6 C) (09/15 0755) Pulse Rate:  [68-102] 102 (09/15 0800) Resp:  [14-21] 14 (09/15 0800) BP: (85-140)/(49-101) 123/79 mmHg (09/15 0800) SpO2:  [96 %-99 %] 97 % (09/15 0800) Weight:  [67.767 kg (149 lb 6.4 oz)-68.04 kg (150 lb)] 67.767 kg (149 lb 6.4 oz) (09/15 0313) Weight change:     Intake/Output from previous day: 09/14 0701 - 09/15 0700 In: 1319.6 [P.O.:510; I.V.:809.6] Out: 1250 [Urine:1250] Intake/Output this shift: Total I/O In: 378.8 [I.V.:378.8] Out: -   Heent: anicteric Neck: no jvd Heart: irr, irr, s1, s2 Lung: ctab Abd: soft, nt, nd, +bs Ext: no c/c/e Neuro: good finger to nose  Lab Results:  Recent Labs  07/04/15 1331  WBC 5.3  HGB 12.5*  HCT 38.4*  PLT 294   BMET  Recent Labs  07/04/15 1331  NA 134*  K 4.1  CL 98*  CO2 28  GLUCOSE 110*  BUN 18  CREATININE 1.11  CALCIUM 9.5    Studies/Results: Mr Jeri Cos Wo Contrast  07/05/2015   CLINICAL DATA:  Vertigo and intermittent nausea for 1 week, generalized weakness. History of acoustic neuroma resection on RIGHT facial paralysis. History of hypertension, atrial fibrillation, diabetes.  EXAM: MRI HEAD WITHOUT CONTRAST  TECHNIQUE: Multiplanar, multiecho pulse sequences of the brain and surrounding structures were obtained without intravenous contrast.  COMPARISON:  CT head June 28, 2007  FINDINGS: No reduced diffusion to suggest acute ischemia. No susceptibility artifact to suggest hemorrhage. Ventricles and sulci are normal for patient's age. Patchy supratentorial white matter T2 hyperintensities are less  than expected for age without midline shift or mass effect. RIGHT inferior cerebellar encephalomalacia, status post retro sigmoid approach for reported acoustic neuroma resection. Low T2, avidly enhancing 9.1 x 9 cm mass in RIGHT external auditory canal extending to the cochlear aperture. Mass extends through the porous acoustics with 7 x 5 mm RIGHT cerebellar pontine angle contiguous component. No abnormal enhancement extending into the inner ear structures. Normal appearance the LEFT internal auditory canal and inner ear structures.  No abnormal extra-axial fluid collections. Normal major intracranial vascular flow voids seen at the skull base. Susceptibility artifact RIGHT frontal calvarium corresponding dual craniotomy. Status post bilateral ocular lens implants. Trace ethmoid mucosal thickening without air-fluid levels. The mastoid air cells are well aerated. No abnormal sellar expansion. No cerebellar tonsillar ectopia. No suspicious calvarial bone marrow signal.  IMPRESSION: No acute intracranial process.  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 Status post RIGHT retro sigmoid approach, RIGHT cerebellar encephalomalacia.  Involutional changes and mild chronic small vessel ischemic disease.   Electronically Signed   By: Elon Alas M.D.   On: 07/05/2015 02:36    Medications: I have reviewed the patient's current medications.  Assessment/Plan: Vertigo Reviewed pt's MRI which showed recurrent right acoustic neuroma 7x53mm  Neurosurgery consultation  Afib with RVR Resolved with metoprolol iv Start on his routine oral medications this am Cont coumadin pharmacy to dose  Anemia Check cbc in am  Hyponatremia Check cmp in am  Hypertension  Cont current medications  Hyperlipidemia Cont crestor, cont fenofibrate  DVT prophylaxis: SCD,  On therapeutic coumadin  LOS: 1 day   HAPPY, [place] 07/05/2015, 8:36 AM

## 2015-07-05 NOTE — Progress Notes (Signed)
  Echocardiogram 2D Echocardiogram has been performed.  Darlina Sicilian M 07/05/2015, 11:12 AM

## 2015-07-05 NOTE — Progress Notes (Signed)
ANTICOAGULATION CONSULT NOTE - Follow Up Consult  Pharmacy Consult for warfarin Indication: atrial fibrillation  No Known Allergies  Patient Measurements: Height: 6' (182.9 cm) Weight: 149 lb 6.4 oz (67.767 kg) IBW/kg (Calculated) : 77.6  Vital Signs: Temp: 97.6 F (36.4 C) (09/15 1129) Temp Source: Oral (09/15 1129) BP: 135/78 mmHg (09/15 1129) Pulse Rate: 78 (09/15 1129)  Labs:  Recent Labs  07/04/15 1331 07/04/15 1756 07/05/15 0255 07/05/15 1051  HGB 12.5*  --   --   --   HCT 38.4*  --   --   --   PLT 294  --   --   --   APTT 54*  --   --   --   LABPROT 31.1*  --   --  31.7*  INR 3.06*  --   --  3.14*  CREATININE 1.11  --   --   --   TROPONINI  --  <0.03 <0.03  --     Estimated Creatinine Clearance: 55.1 mL/min (by C-G formula based on Cr of 1.11).   Medications:  Prescriptions prior to admission  Medication Sig Dispense Refill Last Dose  . buPROPion (WELLBUTRIN) 100 MG tablet Take 100 mg by mouth 2 (two) times daily.    07/04/2015 at Unknown time  . diltiazem (CARDIZEM CD) 240 MG 24 hr capsule Take 240 mg by mouth every morning.    07/04/2015 at Unknown time  . fenofibrate (TRICOR) 145 MG tablet Take 145 mg by mouth at bedtime.    07/03/2015 at Unknown time  . fish oil-omega-3 fatty acids 1000 MG capsule Take 2 g by mouth 2 (two) times daily.    07/04/2015 at Unknown time  . LORazepam (ATIVAN) 0.5 MG tablet Take 0.5 mg by mouth 2 (two) times daily.   07/04/2015 at Unknown time  . metFORMIN (GLUCOPHAGE) 500 MG tablet Take 1,000 mg by mouth daily before supper.   07/03/2015 at Unknown time  . rosuvastatin (CRESTOR) 5 MG tablet Take 5 mg by mouth every Monday. Monday   Past Week at Unknown time  . tamsulosin (FLOMAX) 0.4 MG CAPS capsule Take 1 capsule (0.4 mg total) by mouth daily. (Patient taking differently: Take 0.4 mg by mouth at bedtime. ) 7 capsule 0 07/03/2015 at Unknown time  . warfarin (COUMADIN) 5 MG tablet Take 7.5 mg by mouth daily.    07/04/2015 at Unknown  time  . amLODipine (NORVASC) 5 MG tablet Take 5 mg by mouth every morning.    07/02/2015  . metoprolol succinate (TOPROL-XL) 50 MG 24 hr tablet Take 50 mg by mouth every morning. Take with or immediately following a meal.   07/02/2015  . [DISCONTINUED] zolpidem (AMBIEN) 10 MG tablet Take 10 mg by mouth daily.    Not Taking at Unknown time    Assessment: 74 yo man admitted 07/04/2015 for AFib w/ RVR with HR to 130s and vertigo. Pharmacy consulted to dose warfarin.  PMH AFib on warfarin, DM, HTN, hernia s/p repair, anxiety  Currently rate controlled, CHADS2VASc at least 4. INR 3.04 on admission, 3.14 today and slightly supratherapeutic. Yesterday's Hgb 12.5, plt wnl. Warfarin PTA dosing regimen 7.5 mg qday.   Goal of Therapy:  INR 2-3 Monitor platelets by anticoagulation protocol: Yes   Plan:  Warfarin 6 mg x 1 tonight at 1800  Daily INR CBC tomorrow AM Monitor s/sx bleeding   Heloise Ochoa, Pharm.D. PGY2 Cardiology Pharmacy Resident Pager: [phone]  07/05/2015,3:19 PM

## 2015-07-05 NOTE — Progress Notes (Signed)
Initial Nutrition Assessment  DOCUMENTATION CODES:   Severe malnutrition in context of acute illness/injury  INTERVENTION:   -Continue Ensure Enlive po BID, each supplement provides 350 kcal and 20 grams of protein  NUTRITION DIAGNOSIS:   Malnutrition related to acute illness as evidenced by moderate depletion of body fat, moderate depletions of muscle mass, severe depletion of body fat, severe depletion of muscle mass, percent weight loss.  GOAL:   Patient will meet greater than or equal to 90% of their needs  MONITOR:   PO intake, Supplement acceptance, Labs, Weight trends, Skin, I & O's  REASON FOR ASSESSMENT:   Malnutrition Screening Tool    ASSESSMENT:   74 yo male with htn, atrial fibrillation, hx of acoustic neuroma s/p resection, apparently has had vertigo x 1 week, Some n/v, intermittently. Pt denies headache, focal neurological symptoms, Generally feeling weak. Pt noted not taking metoprolol x 3 days. Pt presented to office and found to be in Afib with rvr, hr 130, Pt sent for direct admission for vertigo and afib with rvr. Pt denies cp, palp, sob, lower ext edema.   Pt admitted with a-fib with RVR.   Spoke with pt at bedside. He shares with this RD that he was just diagnosed with a recurrent brain tumor and complains of being sleepy. He reports poor appetite and weight loss over the past 2 months. He reveals UBW is around 165#, which is consistent with wt hx; pt has experienced approximately a 9.8% wt loss over the past 2 months.  He reports that "nothing tastes good"; he consumed only his eggs at breakfast. PTA, he consumed 2 meals per day, with high protein snacks in between meals (such as peanut butter and graham crackers). Additionally, he has been consuming 1 bottle of Ensure PTA; he reports that he is consuming Ensure ordered here as well (prefers strawberry flavor). RD encouraged pt to continue consuming supplement during hospitalization and at home. Also  discussed importance of good nutritional intake to promote healing.   Nutrition-Focused physical exam completed. Findings are mild to moderate fat depletion, moderate to severe muscle depletion, and no edema.   Labs and medication reviewed.   Diet Order:  Diet heart healthy/carb modified Room service appropriate?: Yes; Fluid consistency:: Thin  Skin:  Reviewed, no issues  Last BM:  PTA  Height:   Ht Readings from Last 1 Encounters:  07/04/15 6' (1.829 m)    Weight:   Wt Readings from Last 1 Encounters:  07/05/15 149 lb 6.4 oz (67.767 kg)    Ideal Body Weight:  80.9 kg  BMI:  Body mass index is 20.26 kg/(m^2).  Estimated Nutritional Needs:   Kcal:  2000-2200  Protein:  95-105 grams  Fluid:  >2.0 L  EDUCATION NEEDS:   Education needs addressed  Ivry Pigue A. Jimmye Norman, RD, LDN, CDE Pager: [phone] After hours Pager: [phone]

## 2015-07-06 DIAGNOSIS — E43 Unspecified severe protein-calorie malnutrition: Secondary | ICD-10-CM | POA: Insufficient documentation

## 2015-07-06 LAB — COMPREHENSIVE METABOLIC PANEL
ALBUMIN: 2.5 g/dL — AB (ref 3.5–5.0)
ALK PHOS: 58 U/L (ref 38–126)
ALT: 13 U/L — AB (ref 17–63)
AST: 21 U/L (ref 15–41)
Anion gap: 9 (ref 5–15)
BUN: 16 mg/dL (ref 6–20)
CALCIUM: 8.7 mg/dL — AB (ref 8.9–10.3)
CO2: 26 mmol/L (ref 22–32)
CREATININE: 0.89 mg/dL (ref 0.61–1.24)
Chloride: 98 mmol/L — ABNORMAL LOW (ref 101–111)
GFR calc non Af Amer: >60 mL/min (ref >60)
GLUCOSE: 98 mg/dL (ref 65–99)
Potassium: 3.4 mmol/L — ABNORMAL LOW (ref 3.5–5.1)
SODIUM: 133 mmol/L — AB (ref 135–145)
Total Bilirubin: 0.6 mg/dL (ref 0.3–1.2)
Total Protein: 5.2 g/dL — ABNORMAL LOW (ref 6.5–8.1)

## 2015-07-06 LAB — CBC
HCT: 36.3 % — ABNORMAL LOW (ref 39.0–52.0)
HEMOGLOBIN: 11.9 g/dL — AB (ref 13.0–17.0)
MCH: 26.7 pg (ref 26.0–34.0)
MCHC: 32.8 g/dL (ref 30.0–36.0)
MCV: 81.6 fL (ref 78.0–100.0)
Platelets: 264 10*3/uL (ref 150–400)
RBC: 4.45 MIL/uL (ref 4.22–5.81)
RDW: 15 % (ref 11.5–15.5)
WBC: 4.3 10*3/uL (ref 4.0–10.5)

## 2015-07-06 LAB — GLUCOSE, CAPILLARY: Glucose-Capillary: 89 mg/dL (ref 65–99)

## 2015-07-06 MED ORDER — ENSURE ENLIVE PO LIQD: 237.0000 mL | Freq: Two times a day (BID) | ORAL | Status: DC

## 2015-07-06 MED ORDER — POTASSIUM CHLORIDE CRYS ER 20 MEQ PO TBCR
20.0000 meq | EXTENDED_RELEASE_TABLET | Freq: Once | ORAL | Status: AC
  Administered 2015-07-06: 20 meq via ORAL
  Filled 2015-07-06: qty 1

## 2015-07-06 MED ORDER — METOPROLOL SUCCINATE ER 25 MG PO TB24: 25.0000 mg | ORAL_TABLET | Freq: Every morning | ORAL | Status: DC

## 2015-07-06 NOTE — Discharge Instructions (Signed)

## 2015-07-06 NOTE — Discharge Summary (Signed)
Physician Discharge Summary  Patient ID: Tommy Weeks MRN: [id number] DOB/AGE: [birth year] 74 y.o.  Admit date: 07/04/2015 Discharge date: 07/06/2015  Admission Diagnoses: Vertigo N/v Afib with RVR Hypertension Protein calorie malnutrition Dm2  Discharge Diagnoses:  Active Problems:   Diabetes mellitus   Hypertension   Atrial fibrillation with rapid ventricular response   Protein-calorie malnutrition, severe Vertigo Recurrent astrocytoma   Discharged Condition: stable  Hospital Course: 74 yo male with hx of dm2, astrocytoma in the remote past s/p resection apparently c/o vertgo and n/v x 1 week and presented to office and found to be in Afib with RVR,  Pt notes missing his metoprolol x 2 days.  Pt was sent for direct admission hr, improved after metoprolol 2.5mg  iv x1, and then pt resumed his po metoprolol as well as cardizem.  Coumadin adjusted due to supratherapeutic INR.  MRI showed R cerebellar astrocytoma 7x12mm.  Neurosurgery consulted and recommended that pt see Dr. Carloyn Manner as an outpatient, and that he would probably require radiation rather than resection.  Pt vertigo has improved. N/v resolved.  Pt is tolerating po intake.  Pt has severe protein calorie malnutrition and is on ensure.  Pt will try to continue this at home. Pt will be discharged to home today.   Consults: neurosurgery  Significant Diagnostic Studies: radiology: MRI brain => recurrent astrocytoma  Treatments: IV hydration, iv metoprolol  Discharge Exam: Blood pressure 109/62, pulse 77, temperature 98.3 F (36.8 C), temperature source Oral, resp. rate 19, height 6' (1.829 m), weight 68.085 kg (150 lb 1.6 oz), SpO2 97 %. Heent: anicteric Neck: no jvd Heart: irr , irr, s1, s2 Lung: ctab ABd  Soft, nt, nd, +bs Ext: no c/c/e   A/P AFIB WITH RVR Resolved.  Pt is back on regular dose of metoprolo, and cardizem and appears rate controlled Coumadin 5mg  po x1 today and then resume 7.5mg  po qday,  Pt will come  to see Dr. 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 Carloyn Manner scheduled  Dm2 Restart on metformin  Hypokalemia repleted Pt will have bmp  On Monday with INR  Protein calorie malnutrition Pt encouraged to use glycerna or ensure at home.   Hyperlipidemia Resume tricor.   Time spent on discharge 50 minutes, also discussed case with his wife and instructions above Disposition: 01-Home or Self Care     Medication List    TAKE these medications        amLODipine 5 MG tablet  Commonly known as:  NORVASC  Take 5 mg by mouth every morning.     buPROPion 100 MG tablet  Commonly known as:  WELLBUTRIN  Take 100 mg by mouth 2 (two) times daily.     diltiazem 240 MG 24 hr capsule  Commonly known as:  CARDIZEM CD  Take 240 mg by mouth every morning.     feeding supplement (ENSURE ENLIVE) Liqd  Take 237 mLs by mouth 2 (two) times daily between meals.     fenofibrate 145 MG tablet  Commonly known as:  TRICOR  Take 145 mg by mouth at bedtime.     fish oil-omega-3 fatty acids 1000 MG capsule  Take 2 g by mouth 2 (two) times daily.     LORazepam 0.5 MG tablet  Commonly known as:  ATIVAN  Take 0.5 mg by mouth 2 (two) times daily.     metFORMIN 500 MG  tablet  Commonly known as:  GLUCOPHAGE  Take 1,000 mg by mouth daily before supper.     metoprolol succinate 25 MG 24 hr tablet  Commonly known as:  TOPROL-XL  Take 1 tablet (25 mg total) by mouth every morning.     rosuvastatin 5 MG tablet  Commonly known as:  CRESTOR  Take 5 mg by mouth every Monday. Monday     tamsulosin 0.4 MG Caps capsule  Commonly known as:  FLOMAX  Take 1 capsule (0.4 mg total) by mouth daily.     warfarin 5 MG tablet  Commonly known as:  COUMADIN  Take 7.5 mg by mouth daily.  Notes to Patient:  Please take only 5mg  today and then resume 7.5 mg daily           Follow-up Information     Follow up with Jani Gravel, MD In 3 days.   Specialty:  Internal Medicine   Contact information:   [address] [place] [address] [phone]       Follow up with Glenna Fellows, MD In 1 week.   Specialty:  Neurosurgery   Contact information:   [place] [address] [phone]       Signed: EVRETT, HAKIM 07/06/2015, 7:26 AM

## 2015-07-06 NOTE — Progress Notes (Signed)
Patient discharged to home with family via w/c to vehicle.  PIV discontinued with pressure dressing applied to site.  Patient awake, alert and oriented at time of discharge.  Telemetry monitor discontinued and CCMD and E-Link notified.  Discharge instructions given to patient and family with all questions and concerns addressed.

## 2015-08-06 ENCOUNTER — Other Ambulatory Visit: Payer: Self-pay | Admitting: Radiation Therapy

## 2015-08-06 ENCOUNTER — Encounter: Payer: Self-pay | Admitting: Radiation Oncology

## 2015-08-06 ENCOUNTER — Encounter: Payer: Self-pay | Admitting: Radiation Therapy

## 2015-08-06 ENCOUNTER — Ambulatory Visit
Admission: RE | Admit: 2015-08-06 | Discharge: 2015-08-06 | Disposition: A | Discharge status: Home / Self Care | Payer: Medicare Other | Source: Ambulatory Visit | Attending: Radiation Oncology | Admitting: Radiation Oncology

## 2015-08-06 VITALS — BP 102/75 | HR 92 | Temp 97.6°F | Resp 18 | Ht 72.0 in | Wt 148.3 lb

## 2015-08-06 DIAGNOSIS — D333 Benign neoplasm of cranial nerves: Secondary | ICD-10-CM | POA: Insufficient documentation

## 2015-08-06 DIAGNOSIS — F329 Major depressive disorder, single episode, unspecified: Secondary | ICD-10-CM | POA: Insufficient documentation

## 2015-08-06 DIAGNOSIS — E785 Hyperlipidemia, unspecified: Secondary | ICD-10-CM | POA: Diagnosis not present

## 2015-08-06 DIAGNOSIS — I10 Essential (primary) hypertension: Secondary | ICD-10-CM | POA: Insufficient documentation

## 2015-08-06 DIAGNOSIS — I482 Chronic atrial fibrillation: Secondary | ICD-10-CM | POA: Insufficient documentation

## 2015-08-06 DIAGNOSIS — R29810 Facial weakness: Secondary | ICD-10-CM | POA: Insufficient documentation

## 2015-08-06 DIAGNOSIS — Z510 Encounter for antineoplastic radiation therapy: Secondary | ICD-10-CM | POA: Diagnosis present

## 2015-08-06 DIAGNOSIS — Z8546 Personal history of malignant neoplasm of prostate: Secondary | ICD-10-CM | POA: Diagnosis not present

## 2015-08-06 DIAGNOSIS — E119 Type 2 diabetes mellitus without complications: Secondary | ICD-10-CM | POA: Diagnosis not present

## 2015-08-06 NOTE — Progress Notes (Signed)
Radiation Oncology         [phone] ________________________________  Name: Tommy Weeks MRN: [id number]  Date: 08/06/2015  DOB: March 28, 1941  CC:KIM, Jeneen Rinks, MD  Ashok Pall, MD     REFERRING PHYSICIAN: Ashok Pall, MD   DIAGNOSIS: The primary encounter diagnosis was Acoustic neuroma [facility]). A diagnosis of Right acoustic neuroma [facility]) was also pertinent to this visit.   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 male who is seen for an initial consultation visit regarding the patient's diagnosis of acoustic neuroma.  The patient presented with dizziness, weight loss, vertigo for the past few of weeks. The patient completed neurosurgery in 2002 with Dr. Carloyn Manner for resection of the acoustic neuroma.. 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 Otherwise the patient has done well during this timeframe. However the recent changes in his overall status including some unsteadiness prompted workup which included an MRI scan of the brain. This has revealed recurrent acoustic neuroma on the right. He was seen by neurosurgery and it was felt that radiation treatment may be a very good option for him. Therefore I have been asked to see the patient today in this regard.  For the past 3 weeks, the patient has been experiencing general unsteadiness that requires the use of a cane. In addition, intermittent left knee pain has recently developed.   The patient states he was diagnosed with prostate cancer by Dr.Ottelin, a biopsy was performed, no plans for treatment. Currently being monitored as it is slow growing.    PREVIOUS RADIATION THERAPY: No   PAST MEDICAL HISTORY:  has a past medical history of Hypertension; Facial droop; Anxiety; Depression; History of acoustic neurofibromatosis; Deafness in right ear; Prostate cancer ([place]); History of urinary retention; History of prostatitis; Chronic  atrial fibrillation ([place]); Dysrhythmia; Type 2 diabetes mellitus ([place]); Complication of anesthesia; Hyperlipidemia; and Neuromuscular disorder ([place]).     PAST SURGICAL HISTORY: Past Surgical History  Procedure Laterality Date  . Cholecystectomy  90's  . Retroperitoneal mass excision  03/31/2013  . Laparotomy N/A 03/31/2013    Procedure: Open resection of abdominal mass;  Surgeon: Stark Klein, MD;  Location: [facility];  Service: General;  Laterality: N/A;  . Cardiac catheterization  1990  . Inguinal hernia repair Bilateral 05/24/2013    Procedure: bilateral open HERNIA REPAIR INGUINAL ADULT;  Surgeon: Gayland Curry, MD;  Location: [facility];  Service: General;  Laterality: Bilateral;  . Insertion of mesh Bilateral 05/24/2013    Procedure: INSERTION OF MESH;  Surgeon: Gayland Curry, MD;  Location: [facility];  Service: General;  Laterality: Bilateral;  . Umbilical hernia repair  1990's  . Craniectomy for excision of acoustic neuroma Right 02-12-2001  . Hydrocele excision Right 11-26-2007  . Exploration face right side  11-29-2001  &  02-06-2003    post craniotomy resection acoustic neuroma--  facial paralysis  . Transthoracic echocardiogram  05-11-2007  (per PCP note)    ef 55-65%,  mild TR and MR  . Eye surgery Bilateral     cataract OU w/ IOL  . Transrectal ultrasound N/A 06/11/2015    Procedure: TRANSRECTAL ULTRASOUND AND BIOPSY;  Surgeon: Kathie Rhodes, MD;  Location: [facility];  Service: Urology;  Laterality: N/A;     FAMILY HISTORY: family history is not on file.   SOCIAL HISTORY:  reports that he has never smoked. He has never used smokeless  tobacco. He reports that he does not drink alcohol or use illicit drugs.   ALLERGIES: Review of patient's allergies indicates no known allergies.   MEDICATIONS:  Current Outpatient Prescriptions  Medication Sig Dispense Refill  . acetaminophen (TYLENOL) 500 MG tablet Take 500 mg by mouth every 8 (eight) hours as needed for mild pain.     Marland Kitchen amLODipine (NORVASC) 5 MG tablet Take 5 mg by mouth every morning.     Marland Kitchen buPROPion (WELLBUTRIN) 100 MG tablet Take 100 mg by mouth 2 (two) times daily.     Marland Kitchen diltiazem (CARDIZEM CD) 240 MG 24 hr capsule Take 240 mg by mouth every morning.     . feeding supplement, ENSURE ENLIVE, (ENSURE ENLIVE) LIQD Take 237 mLs by mouth 2 (two) times daily between meals. 237 mL 12  . fenofibrate (TRICOR) 145 MG tablet Take 145 mg by mouth at bedtime.     . fish oil-omega-3 fatty acids 1000 MG capsule Take 2 g by mouth 2 (two) times daily.     Marland Kitchen LORazepam (ATIVAN) 0.5 MG tablet Take 0.5 mg by mouth 2 (two) times daily.    . metFORMIN (GLUCOPHAGE) 500 MG tablet Take 1,000 mg by mouth daily before supper.    . metoprolol succinate (TOPROL-XL) 25 MG 24 hr tablet Take 1 tablet (25 mg total) by mouth every morning. 30 tablet 1  . rosuvastatin (CRESTOR) 5 MG tablet Take 5 mg by mouth every Monday. Monday    . tamsulosin (FLOMAX) 0.4 MG CAPS capsule Take 1 capsule (0.4 mg total) by mouth daily. (Patient taking differently: Take 0.4 mg by mouth at bedtime. ) 7 capsule 0  . warfarin (COUMADIN) 5 MG tablet Take 7.5 mg by mouth daily.      No current facility-administered medications for this encounter.     REVIEW OF SYSTEMS:  A 15 point review of systems is documented in the electronic medical record. This was obtained by the nursing staff. However, I reviewed this with the patient to discuss relevant findings and make appropriate changes.  Pertinent items are noted in HPI.    PHYSICAL EXAM:  height is 6' (1.829 m) and weight is 148 lb 4.8 oz (67.268 kg). His oral temperature is 97.6 F (36.4 C). His blood pressure is 102/75 and his pulse is 92. His respiration is 18 and oxygen saturation is 100%.   General: Well-developed, in no acute distress HEENT: Normocephalic, atraumatic; oral cavity clear Neck: Supple without any lymphadenopathy Cardiovascular: Regular rate and rhythm Respiratory: Clear to auscultation  bilaterally GI: Soft, nontender, normal bowel sounds Extremities: No edema present Neuro: dense facial paralysis on the right.    ECOG = 2  0 - Asymptomatic (Fully active, able to carry on all predisease activities without restriction)  1 - Symptomatic but completely ambulatory (Restricted in physically strenuous activity but ambulatory and able to carry out work of a light or sedentary nature. For example, light housework, office work)  2 - Symptomatic, <50% in bed during the day (Ambulatory and capable of all self care but unable to carry out any work activities. Up and about more than 50% of waking hours)  3 - Symptomatic, >50% in bed, but not bedbound (Capable of only limited self-care, confined to bed or chair 50% or more of waking hours)  4 - Bedbound (Completely disabled. Cannot carry on any self-care. Totally confined to bed or chair)  5 - Death   Eustace Pen MM, Creech RH, Tormey DC, et al. [phone]). "Toxicity and response  criteria of the [facility]". [place] Oncol. 5 (6): 649-55  _   LABORATORY DATA:  Lab Results  Component Value Date   WBC 4.3 07/06/2015   HGB 11.9* 07/06/2015   HCT 36.3* 07/06/2015   MCV 81.6 07/06/2015   PLT 264 07/06/2015   Lab Results  Component Value Date   NA 133* 07/06/2015   K 3.4* 07/06/2015   CL 98* 07/06/2015   CO2 26 07/06/2015   Lab Results  Component Value Date   ALT 13* 07/06/2015   AST 21 07/06/2015   ALKPHOS 58 07/06/2015   BILITOT 0.6 07/06/2015      RADIOGRAPHY: No results found.     IMPRESSION:  The patient was found to have recurrence of acoustic neuroma after MRI scan was performed in the setting of unsteadiness in the past 3 weeks. The patient has had right facial paralysis since 2002 surgery. Patient has seen Dr.Cabbell and he feels that the patient would be a good candidate for radiation treatment. The patient would likely benefit from radiosurgery.    PLAN: We discussed the possible  side effects and risks of treatment in addition to the possible benefits of treatment. We discussed the protocol for radiation treatment.  All of the patient's questions were answered. The patient does wish to proceed with this treatment. A simulation will be scheduled such that we can proceed with treatment planning.   Patient to receive MRI tomorrow at [facility] and simulation on Wednesday. I anticipate a single fraction of radiosurgery to a dose of 13 Gy.        ________________________________   Jodelle Gross, MD, PhD   **Disclaimer: This note was dictated with voice recognition software. Similar sounding words can inadvertently be transcribed and this note may contain transcription errors which may not have been corrected upon publication of note.**  This document serves as a record of services personally performed by Kyung Rudd, MD. It was created on his behalf by Derek Mound, a trained medical scribe. The creation of this record is based on the scribe's personal observations and the provider's statements to them. This document has been checked and approved by the attending provider.

## 2015-08-06 NOTE — Progress Notes (Addendum)
1.  Do you need a wheel chair?    No- cane  2. On oxygen?   no  3. Have you ever had any surgery in the body part being scanned?  Yes back in April 2002 by Glenna Fellows he had the acoustic neuroma debulked   4. Have you ever had any surgery on your brain or heart?    Heart no, but Rt acoustic neuroma removed in 2002                                         5. Have you ever had surgery on your eyes or ears?       Yes cataracts removed and lid stabilization on the Rt                                     6. Do you have a pacemaker or defibrillator?   no  7. Do you have a Neurostimulator?   no 8. Claustrophobic?  no  9. Any risk for metal in eyes?  no  10. Injury by bullet, buckshot, or shrapnel?   no  11. Stent?   no                                                                                                                         12. Hx of Cancer?   Yes,  prostate                                                                                                13. Kidney or Liver disease?  no  14. Hx of Lupus, Rheumatoid Arthritis or Scleroderma? no  15. IV Antibiotics or long term use of NSAIDS?  no  16. HX of Hypertension?  Yes takes meds  17. Diabetes?  Yes- takes Metformin   18. Allergy to contrast?  No   19. Recent labs. Yes in [place] drawn 9/16  Questions and answers reviewed with pt over the phone on 08/06/15  Mont Dutton  Special Procedures Navigator

## 2015-08-06 NOTE — Progress Notes (Addendum)
Location/Histology of Brain Tumor: Recurrent right acoustic neuroma, right porus acusticus at the cerebellar pontine angle  Patient presented with symptoms of:  dizzyness weight loss, vertigo  1 week,   Past or anticipated interventions, if any, per neurosurgery:2002, Dr. Elta Guadeloupe  Roy,MD, referral Dr. 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 NO  Pacemaker/ICD? NO  Is the patient on methotrexate? NO  Additional Complaints / other details: HX prostate cancer, 2 herniorrhaphies and a mass removed by [place] surgery, non smoker, no alcohol or drug use,  MRI 08/07/15 BP 102/75 mmHg  Pulse 92  Temp(Src) 97.6 F (36.4 C) (Oral)  Resp 18  Ht 6' (1.829 m)  Wt 148 lb 4.8 oz (67.268 kg)  BMI 20.11 kg/m2  SpO2 100%  Wt Readings from Last 3 Encounters:  08/06/15 148 lb 4.8 oz (67.268 kg)  07/06/15 150 lb 1.6 oz (68.085 kg)  06/11/15 152 lb 8 oz (69.174 kg)

## 2015-08-07 ENCOUNTER — Ambulatory Visit
Admission: RE | Admit: 2015-08-07 | Discharge: 2015-08-07 | Disposition: A | Discharge status: Home / Self Care | Payer: Medicare Other | Source: Ambulatory Visit | Attending: Radiation Oncology | Admitting: Radiation Oncology

## 2015-08-07 ENCOUNTER — Telehealth: Payer: Self-pay | Admitting: *Deleted

## 2015-08-07 DIAGNOSIS — D333 Benign neoplasm of cranial nerves: Secondary | ICD-10-CM

## 2015-08-07 MED ORDER — GADOBENATE DIMEGLUMINE 529 MG/ML IV SOLN: 15.0000 mL | Freq: Once | INTRAVENOUS | Status: DC | PRN

## 2015-08-08 ENCOUNTER — Ambulatory Visit
Admission: RE | Admit: 2015-08-08 | Discharge: 2015-08-08 | Disposition: A | Discharge status: Home / Self Care | Payer: Medicare Other | Source: Ambulatory Visit | Attending: Radiation Oncology | Admitting: Radiation Oncology

## 2015-08-08 VITALS — BP 73/52 | HR 98 | Temp 98.0°F | Resp 20

## 2015-08-08 DIAGNOSIS — Z510 Encounter for antineoplastic radiation therapy: Secondary | ICD-10-CM | POA: Diagnosis not present

## 2015-08-08 DIAGNOSIS — D333 Benign neoplasm of cranial nerves: Secondary | ICD-10-CM

## 2015-08-08 MED ORDER — SODIUM CHLORIDE 0.9 % IJ SOLN
10.0000 mL | Freq: Once | INTRAMUSCULAR | Status: AC
  Administered 2015-08-08: 10 mL via INTRAVENOUS

## 2015-08-08 NOTE — Telephone Encounter (Signed)
error 

## 2015-08-08 NOTE — Progress Notes (Signed)
940am d/c IV catheter, tip intact,  2x2 x 2 gause placed over right AC and taped, gave instructions to leave on a few hours and replace with bandiad that was given, no metformin for 48 hours and lab on Friday, encouraged again to drink plenty water, patient agreed, d/c home with patient sister at side,  10:32 AM

## 2015-08-08 NOTE — Progress Notes (Addendum)
Patient arrived steady gait with cane, name and dob as iddentification stated, labs 07/04/15: BUN=16, CR=-0.89, no pain, just weakness, dizzyness, wobbly stated, here for IV start,he stated he did not take his metformin this am,  Took ortho vitals, sitting[B/P=99/61,P=99,RR=20<T=98.0 Standing B/P=72/52.P=98, irregular pulse,skips a beat, hs does have A--Fib , started IV catheter right AC  #22g 1inch, x 1 attempt, excellent blood return, flushed 108ml normal saline, patient tolerated well, stated"that was the easiest injection I have had,you're good", warm blanket offered, gave bottle of water and encouraged to drinkl, brought wife back to sit with him and infomred Amy, RT Therapist in Ct Sim patient is ready, patient knows not to take his metformin for 48 hours and on Friday labs will be drawn, encouraged to drink lots of water after Ct Sim completed today 8:39 AM BP 73/52 mmHg  Pulse 98  Temp(Src) 98 F (36.7 C) (Oral)  Resp 20  SpO2 99% Standing  Informed MD of orthostatic vitals

## 2015-08-10 ENCOUNTER — Ambulatory Visit
Admission: RE | Admit: 2015-08-10 | Discharge: 2015-08-10 | Disposition: A | Discharge status: Home / Self Care | Payer: Medicare Other | Source: Ambulatory Visit | Attending: Radiation Oncology | Admitting: Radiation Oncology

## 2015-08-10 ENCOUNTER — Telehealth: Payer: Self-pay | Admitting: Radiation Therapy

## 2015-08-10 DIAGNOSIS — D333 Benign neoplasm of cranial nerves: Secondary | ICD-10-CM | POA: Insufficient documentation

## 2015-08-10 LAB — BUN AND CREATININE (CC13)
BUN: 23.4 mg/dL (ref 7.0–26.0)
Creatinine: 1 mg/dL (ref 0.7–1.3)
EGFR: 69 mL/min/{1.73_m2} — AB (ref >90)

## 2015-08-10 NOTE — Telephone Encounter (Addendum)
Tommy Weeks had a CT SIM on 10/19 and IV contrast was used. Per our departmental protocol, he was asked to not take his Metformin the date of the [place] and to not resume the Metformin until after labs have been drawn 48 hour later and confirmed by his MD.   His labs were drawn this morning, and reviewed by Dr. Lisbeth Renshaw. BUN 23.4 and Creat 1.0. Per Dr. Lisbeth Renshaw he is fine to resume his Metformin this afternoon.   I called and spoke with Tommy Weeks to share these results. He understands and will resume his Metformin as scheduled this evening.  Tommy Weeks was happy for the call and had no other concerns or questions.  Tommy Weeks R.T. (R) (T) [facility] [place] [phone] Office [phone] Pager [phone] Fax [email or web address]

## 2015-08-16 DIAGNOSIS — Z510 Encounter for antineoplastic radiation therapy: Secondary | ICD-10-CM | POA: Diagnosis not present

## 2015-08-19 ENCOUNTER — Ambulatory Visit: Admission: RE | Admit: 2015-08-19 | Payer: Medicare Other | Source: Ambulatory Visit | Admitting: Radiation Oncology

## 2015-08-20 ENCOUNTER — Ambulatory Visit: Payer: Medicare Other | Admitting: Radiation Oncology

## 2015-08-20 ENCOUNTER — Ambulatory Visit
Admission: RE | Admit: 2015-08-20 | Discharge: 2015-08-20 | Disposition: A | Discharge status: Home / Self Care | Payer: Medicare Other | Source: Ambulatory Visit | Attending: Radiation Oncology | Admitting: Radiation Oncology

## 2015-08-20 ENCOUNTER — Encounter: Payer: Self-pay | Admitting: Radiation Oncology

## 2015-08-20 VITALS — BP 111/77 | HR 66 | Temp 97.9°F | Resp 16

## 2015-08-20 DIAGNOSIS — Z510 Encounter for antineoplastic radiation therapy: Secondary | ICD-10-CM | POA: Diagnosis not present

## 2015-08-20 DIAGNOSIS — D333 Benign neoplasm of cranial nerves: Secondary | ICD-10-CM

## 2015-08-20 NOTE — Progress Notes (Signed)
S/p SRS Brain treatment, patient tolerated well, vitals WNL,  No pain or nausea, or head ache, still some dizzyness,monitor 30 minutes, water offered, warm blanket BP 111/77 mmHg  Pulse 66  Temp(Src) 97.9 F (36.6 C) (Oral)  Resp 16  SpO2 99%  Wt Readings from Last 3 Encounters:  08/06/15 148 lb 4.8 oz (67.268 kg)  07/06/15 150 lb 1.6 oz (68.085 kg)  06/11/15 152 lb 8 oz (69.174 kg)

## 2015-09-05 NOTE — Addendum Note (Signed)
Encounter addended by: Kyung Rudd, MD on: 09/05/2015 10:04 AM<BR>     Documentation filed: Notes Section, Visit Diagnoses

## 2015-09-05 NOTE — Addendum Note (Signed)
Encounter addended by: Kyung Rudd, MD on: 09/05/2015 10:06 AM<BR>     Documentation filed: Notes Section

## 2015-09-05 NOTE — Progress Notes (Signed)
  Radiation Oncology         [phone] ________________________________  Name: Tommy Weeks MRN: [id number]  Date: 08/08/2015  DOB: 01-17-1941  SIMULATION AND TREATMENT PLANNING NOTE  DIAGNOSIS:  Right acoustic neuroma  NARRATIVE:  The patient was brought to the [facility].  Identity was confirmed.  All relevant records and images related to the planned course of therapy were reviewed.  The patient freely provided informed written consent to proceed with treatment after reviewing the details related to the planned course of therapy. The consent form was witnessed and verified by the simulation staff. Intravenous access was established for contrast administration. Then, the patient was set-up in a stable reproducible supine position for radiation therapy.  A relocatable thermoplastic stereotactic head frame was fabricated for precise immobilization.  CT images were obtained.  Surface markings were placed.  The CT images were loaded into the planning software and fused with the patient's targeting MRI scan.  Then the target and avoidance structures were contoured.  Treatment planning then occurred.  The radiation prescription was entered and confirmed.  I have requested 3D planning  I have requested a DVH of the following structures: Brain stem, brain, left eye, right I, lenses, optic chiasm, target volumes, uninvolved brain, and normal tissue.    PLAN:  The patient will receive 13 Gy in 1 fraction.   Special treatment procedure The patient will be treated with a course of radiosurgery. This requires extremely precise localization of the target and sophisticated, labor intensive treatment planning to achieve a highly focused radiation treatment plan. Due to the extra work involved in planning and delivery of such a procedure, this corresponds to a special treatment procedure.  ________________________________  Jodelle Gross, MD, PhD

## 2015-09-05 NOTE — Progress Notes (Signed)
  Radiation Oncology         [phone] ________________________________  Name: TUFF MATURO MRN: [id number]  Date: 08/20/2015  DOB: 1941/06/25   SPECIAL TREATMENT PROCEDURE   3D TREATMENT PLANNING AND DOSIMETRY: The patient's radiation plan was reviewed and approved by Dr. Cyndy Freeze from neurosurgery and radiation oncology prior to treatment. It showed 3-dimensional radiation distributions overlaid onto the planning CT/MRI image set. The [facility] for the target structures as well as the organs at risk were reviewed. The documentation of the 3D plan and dosimetry are filed in the radiation oncology EMR.   NARRATIVE: The patient was brought to the [employer] stereotactic radiation treatment machine and placed supine on the CT couch. The head frame was applied, and the patient was set up for stereotactic radiosurgery. Neurosurgery was present for the set-up and delivery   SIMULATION VERIFICATION: In the couch zero-angle position, the patient underwent Exactrac imaging using the Brainlab system with orthogonal KV images. These were carefully aligned and repeated to confirm treatment position for each of the isocenters. The Exactrac snap film verification was repeated at each couch angle.   SPECIAL TREATMENT PROCEDURE: The patient received stereotactic radiosurgery to the following target:  PTV1 Rt acoustic neuroma target was treated using 4 Arcs to a prescription dose of 13 Gy. ExacTrac Snap verification was performed for each couch angle.   STEREOTACTIC TREATMENT MANAGEMENT: Following delivery, the patient was transported to nursing in stable condition and monitored for possible acute effects. Vital signs were recorded . The patient tolerated treatment without significant acute effects, and was discharged to home in stable condition.  PLAN: Follow-up in one month.   ------------------------------------------------  Jodelle Gross, MD, PhD

## 2015-09-05 NOTE — Progress Notes (Signed)
  Radiation Oncology         [phone] ________________________________  Name: Tommy Weeks MRN: [id number]  Date: 08/20/2015  DOB: 1941/09/11  End of Treatment Note  Diagnosis:   Right acoustic neuroma     Indication for treatment:  palliative       Radiation treatment dates:   08/20/15  Site/dose:    PTV1 Rt acoustic neuroma target was treated using 4 Arcs to a prescription dose of 13 Gy. ExacTrac Snap verification was performed for each couch angle.   Narrative: The patient tolerated radiation treatment well.   There were no signs of acute toxicity after treatment.  Plan: The patient has completed radiation treatment. The patient will return to radiation oncology clinic for routine followup in one month. I advised the patient to call or return sooner if they have any questions or concerns related to their recovery or treatment. ________________________________  ------------------------------------------------  Jodelle Gross, MD, PhD

## 2015-09-09 ENCOUNTER — Encounter (HOSPITAL_COMMUNITY): Payer: Self-pay | Admitting: *Deleted

## 2015-09-09 ENCOUNTER — Emergency Department (HOSPITAL_COMMUNITY)
Admission: EM | Admit: 2015-09-09 | Discharge: 2015-09-09 | Disposition: A | Discharge status: Home / Self Care | Payer: Medicare Other | Attending: Emergency Medicine | Admitting: Emergency Medicine

## 2015-09-09 DIAGNOSIS — I10 Essential (primary) hypertension: Secondary | ICD-10-CM | POA: Insufficient documentation

## 2015-09-09 DIAGNOSIS — I482 Chronic atrial fibrillation: Secondary | ICD-10-CM | POA: Diagnosis not present

## 2015-09-09 DIAGNOSIS — E785 Hyperlipidemia, unspecified: Secondary | ICD-10-CM | POA: Insufficient documentation

## 2015-09-09 DIAGNOSIS — Z79899 Other long term (current) drug therapy: Secondary | ICD-10-CM | POA: Diagnosis not present

## 2015-09-09 DIAGNOSIS — Z87438 Personal history of other diseases of male genital organs: Secondary | ICD-10-CM | POA: Insufficient documentation

## 2015-09-09 DIAGNOSIS — H9191 Unspecified hearing loss, right ear: Secondary | ICD-10-CM | POA: Insufficient documentation

## 2015-09-09 DIAGNOSIS — R338 Other retention of urine: Secondary | ICD-10-CM

## 2015-09-09 DIAGNOSIS — E119 Type 2 diabetes mellitus without complications: Secondary | ICD-10-CM | POA: Insufficient documentation

## 2015-09-09 DIAGNOSIS — R339 Retention of urine, unspecified: Secondary | ICD-10-CM | POA: Diagnosis present

## 2015-09-09 DIAGNOSIS — F329 Major depressive disorder, single episode, unspecified: Secondary | ICD-10-CM | POA: Insufficient documentation

## 2015-09-09 DIAGNOSIS — F419 Anxiety disorder, unspecified: Secondary | ICD-10-CM | POA: Diagnosis not present

## 2015-09-09 DIAGNOSIS — R1030 Lower abdominal pain, unspecified: Secondary | ICD-10-CM | POA: Insufficient documentation

## 2015-09-09 DIAGNOSIS — Z9889 Other specified postprocedural states: Secondary | ICD-10-CM | POA: Diagnosis not present

## 2015-09-09 DIAGNOSIS — Z8546 Personal history of malignant neoplasm of prostate: Secondary | ICD-10-CM | POA: Diagnosis not present

## 2015-09-09 DIAGNOSIS — Z7901 Long term (current) use of anticoagulants: Secondary | ICD-10-CM | POA: Insufficient documentation

## 2015-09-09 LAB — URINALYSIS, ROUTINE W REFLEX MICROSCOPIC
BILIRUBIN URINE: NEGATIVE
Glucose, UA: NEGATIVE mg/dL
Hgb urine dipstick: NEGATIVE
KETONES UR: NEGATIVE mg/dL
LEUKOCYTES UA: NEGATIVE
NITRITE: NEGATIVE
PH: 6.5 (ref 5.0–8.0)
Protein, ur: NEGATIVE mg/dL
Specific Gravity, Urine: 1.01 (ref 1.005–1.030)

## 2015-09-09 NOTE — ED Notes (Signed)
Awake. Verbally responsive. A/O x4. Resp even and unlabored. No audible adventitious breath sounds noted. ABC's intact. F/C patent and intact draining yellow urine without difficulty. 

## 2015-09-09 NOTE — Discharge Instructions (Signed)
Acute Urinary Retention, Male °Acute urinary retention is the temporary inability to urinate. °This is a common problem in older men. As men age their prostates become larger and block the flow of urine from the bladder. This is usually a problem that has come on gradually.  °HOME CARE INSTRUCTIONS °If you are sent home with a Foley catheter and a drainage system, you will need to discuss the best course of action with your health care provider. While the catheter is in, maintain a good intake of fluids. Keep the drainage bag emptied and lower than your catheter. This is so that contaminated urine will not flow back into your bladder, which could lead to a urinary tract infection. °There are two main types of drainage bags. One is a large bag that usually is used at night. It has a good capacity that will allow you to sleep through the night without having to empty it. The second type is called a leg bag. It has a smaller capacity, so it needs to be emptied more frequently. However, the main advantage is that it can be attached by a leg strap and can go underneath your clothing, allowing you the freedom to move about or leave your home. °Only take over-the-counter or prescription medicines for pain, discomfort, or fever as directed by your health care provider.  °SEEK MEDICAL CARE IF: °· You develop a low-grade fever. °· You experience spasms or leakage of urine with the spasms. °SEEK IMMEDIATE MEDICAL CARE IF:  °1. You develop chills or fever. °2. Your catheter stops draining urine. °3. Your catheter falls out. °4. You start to develop increased bleeding that does not respond to rest and increased fluid intake. °MAKE SURE YOU: °1. Understand these instructions. °2. Will watch your condition. °3. Will get help right away if you are not doing well or get worse. °  °This information is not intended to replace advice given to you by your health care provider. Make sure you discuss any questions you have with your  health care provider. °  °Document Released: 01/12/2001 Document Revised: 02/20/2015 Document Reviewed: 03/17/2013 °Elsevier Interactive Patient Education ©2016 [employer] ° ° ° ° °Foley Catheter Care, Adult °A Foley catheter is a soft, flexible tube that is placed into the bladder to drain urine. A Foley catheter may be inserted if: °· You leak urine or are not able to control when you urinate (urinary incontinence). °· You are not able to urinate when you need to (urinary retention). °· You had prostate surgery or surgery on the genitals. °· You have certain medical conditions, such as multiple sclerosis, dementia, or a spinal cord injury. °If you are going home with a Foley catheter in place, follow the instructions below. °TAKING CARE OF THE CATHETER °5. Wash your hands with soap and water. °6. Using mild soap and warm water on a clean washcloth: °¨ Clean the area on your body closest to the catheter insertion site using a circular motion, moving away from the catheter. Never wipe toward the catheter because this could sweep bacteria up into the urethra and cause infection. °¨ Remove all traces of soap. Pat the area dry with a clean towel. For males, reposition the foreskin. °7. Attach the catheter to your leg so there is no tension on the catheter. Use adhesive tape or a leg strap. If you are using adhesive tape, remove any sticky residue left behind by the previous tape you used. °8. Keep the drainage bag below the level of   the bladder, but keep it off the floor. °9. Check throughout the day to be sure the catheter is working and urine is draining freely. Make sure the tubing does not become kinked. °10. Do not pull on the catheter or try to remove it. Pulling could damage internal tissues. °TAKING CARE OF THE DRAINAGE BAGS °You will be given two drainage bags to take home. One is a large overnight drainage bag, and the other is a smaller leg bag that fits underneath clothing. You may wear the overnight bag  at any time, but you should never wear the smaller leg bag at night. Follow the instructions below for how to empty, change, and clean your drainage bags. °Emptying the Drainage Bag °You must empty your drainage bag when it is  -½ full or at least 2-3 times a day. °4. Wash your hands with soap and water. °5. Keep the drainage bag below your hips, below the level of your bladder. This stops urine from going back into the tubing and into your bladder. °6. Hold the dirty bag over the toilet or a clean container. °7. Open the pour spout at the bottom of the bag and empty the urine into the toilet or container. Do not let the pour spout touch the toilet, container, or any other surface. Doing so can place bacteria on the bag, which can cause an infection. °8. Clean the pour spout with a gauze pad or cotton ball that has rubbing alcohol on it. °9. Close the pour spout. °10. Attach the bag to your leg with adhesive tape or a leg strap. °11. Wash your hands well. °Changing the Drainage Bag °Change your drainage bag once a month or sooner if it starts to smell bad or look dirty. Below are steps to follow when changing the drainage bag. °1. Wash your hands with soap and water. °2. Pinch off the rubber catheter so that urine does not spill out. °3. Disconnect the catheter tube from the drainage tube at the connection valve. Do not let the tubes touch any surface. °4. Clean the end of the catheter tube with an alcohol wipe. Use a different alcohol wipe to clean the end of the drainage tube. °5. Connect the catheter tube to the drainage tube of the clean drainage bag. °6. Attach the new bag to the leg with adhesive tape or a leg strap. Avoid attaching the new bag too tightly. °7. Wash your hands well. °Cleaning the Drainage Bag °1. Wash your hands with soap and water. °2. Wash the bag in warm, soapy water. °3. Rinse the bag thoroughly with warm water. °4. Fill the bag with a solution of white vinegar and water (1 cup vinegar to  1 qt warm water [.2 L vinegar to 1 L warm water]). Close the bag and soak it for 30 minutes in the solution. °5. Rinse the bag with warm water. °6. Hang the bag to dry with the pour spout open and hanging downward. °7. Store the clean bag (once it is dry) in a clean plastic bag. °8. Wash your hands well. °PREVENTING INFECTION °· Wash your hands before and after handling your catheter. °· Take showers daily and wash the area where the catheter enters your body. Do not take baths. Replace wet leg straps with dry ones, if this applies. °· Do not use powders, sprays, or lotions on the genital area. Only use creams, lotions, or ointments as directed by your caregiver. °· For females, wipe from front to back after   each bowel movement. °· Drink enough fluids to keep your urine clear or pale yellow unless you have a fluid restriction. °· Do not let the drainage bag or tubing touch or lie on the floor. °· Wear cotton underwear to absorb moisture and to keep your skin drier. °SEEK MEDICAL CARE IF:  °· Your urine is cloudy or smells unusually bad. °· Your catheter becomes clogged. °· You are not draining urine into the bag or your bladder feels full. °· Your catheter starts to leak. °SEEK IMMEDIATE MEDICAL CARE IF:  °· You have pain, swelling, redness, or pus where the catheter enters the body. °· You have pain in the abdomen, legs, lower back, or bladder. °· You have a fever. °· You see blood fill the catheter, or your urine is pink or red. °· You have nausea, vomiting, or chills. °· Your catheter gets pulled out. °MAKE SURE YOU:  °· Understand these instructions. °· Will watch your condition. °· Will get help right away if you are not doing well or get worse. °  °This information is not intended to replace advice given to you by your health care provider. Make sure you discuss any questions you have with your health care provider. °  °Document Released: 10/06/2005 Document Revised: 02/20/2014 Document Reviewed:  09/27/2012 °Elsevier Interactive Patient Education ©2016 Elsevier Inc. ° °

## 2015-09-09 NOTE — ED Provider Notes (Signed)
CSN: [id number]     Arrival date & time 09/09/15  [id number] History   First MD Initiated Contact with Patient 09/09/15 0845     Chief Complaint  Patient presents with  . Urinary Retention     (Consider location/radiation/quality/duration/timing/severity/associated sxs/prior Treatment) Patient is a 74 y.o. male presenting with male genitourinary complaint. The history is provided by the patient.  Male GU Problem Presenting symptoms: no dysuria, no penile pain and no scrotal pain   Context: during urination (unable to void)   Relieved by:  Nothing Worsened by:  Nothing tried Ineffective treatments:  None tried Associated symptoms: urinary retention   Associated symptoms: no abdominal pain, no fever, no hematuria, no nausea and no vomiting   Risk factors comment:  Known prostate Ca being managed medically   Past Medical History  Diagnosis Date  . Hypertension   . 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 Anxiety   . Depression   . 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 Prostate cancer ([place])   . History of urinary retention   . History of prostatitis   . Chronic atrial fibrillation ([place])   . Dysrhythmia   . Type 2 diabetes mellitus (H[facility])     NIDDM  . Complication of anesthesia     urinary retention  . Hyperlipidemia   . Neuromuscular disorder ([place])     imbalance s/p craniectomy  . Prostate cancer [facility])    Past Surgical History  Procedure Laterality Date  . Cholecystectomy  90's  . Retroperitoneal mass excision  03/31/2013  . Laparotomy N/A 03/31/2013    Procedure: Open resection of abdominal mass;  Surgeon: Stark Klein, MD;  Location: [facility];  Service: General;  Laterality: N/A;  . Cardiac catheterization  1990  . Inguinal hernia repair Bilateral 05/24/2013    Procedure: bilateral open HERNIA REPAIR  INGUINAL ADULT;  Surgeon: Gayland Curry, MD;  Location: [facility];  Service: General;  Laterality: Bilateral;  . Insertion of mesh Bilateral 05/24/2013    Procedure: INSERTION OF MESH;  Surgeon: Gayland Curry, MD;  Location: [facility];  Service: General;  Laterality: Bilateral;  . Umbilical hernia repair  1990's  . Craniectomy for excision of acoustic neuroma Right 02-12-2001  . Hydrocele excision Right 11-26-2007  . Exploration face right side  11-29-2001  &  02-06-2003    post craniotomy resection acoustic neuroma--  facial paralysis  . Transthoracic echocardiogram  05-11-2007  (per PCP note)    ef 55-65%,  mild TR and MR  . Eye surgery Bilateral     cataract OU w/ IOL  . Transrectal ultrasound N/A 06/11/2015    Procedure: TRANSRECTAL ULTRASOUND AND BIOPSY;  Surgeon: Kathie Rhodes, MD;  Location: [facility];  Service: Urology;  Laterality: N/A;   No family history on file. Social History  Substance Use Topics  . Smoking status: Never Smoker   . Smokeless tobacco: Never Used  . Alcohol Use: No    Review of Systems  Constitutional: Negative for fever.  Gastrointestinal: Negative for nausea, vomiting and abdominal pain.  Genitourinary: Negative for dysuria, hematuria and penile pain.  All other systems reviewed and are negative.     Allergies  Review of patient's allergies indicates no known allergies.  Home Medications  Prior to Admission medications   Medication Sig Start Date End Date Taking? Authorizing Provider  acetaminophen (TYLENOL) 500 MG tablet Take 500 mg by mouth every 8 (eight) hours as needed for mild pain.    Historical Provider, MD  amLODipine (NORVASC) 5 MG tablet Take 5 mg by mouth every morning.     Historical Provider, MD  buPROPion (WELLBUTRIN) 100 MG tablet Take 100 mg by mouth 2 (two) times daily.     Historical Provider, MD  diltiazem (CARDIZEM CD) 240 MG 24 hr capsule Take 240 mg by mouth every morning.     Historical Provider, MD  feeding  supplement, ENSURE ENLIVE, (ENSURE ENLIVE) LIQD Take 237 mLs by mouth 2 (two) times daily between meals. 07/06/15   Jani Gravel, MD  fenofibrate (TRICOR) 145 MG tablet Take 145 mg by mouth at bedtime.     Historical Provider, MD  fish oil-omega-3 fatty acids 1000 MG capsule Take 2 g by mouth 2 (two) times daily.     Historical Provider, MD  LORazepam (ATIVAN) 0.5 MG tablet Take 0.5 mg by mouth 2 (two) times daily.    Historical Provider, MD  metFORMIN (GLUCOPHAGE) 500 MG tablet Take 1,000 mg by mouth daily before supper.    Historical Provider, MD  metoprolol succinate (TOPROL-XL) 25 MG 24 hr tablet Take 1 tablet (25 mg total) by mouth every morning. 07/06/15   Jani Gravel, MD  rosuvastatin (CRESTOR) 5 MG tablet Take 5 mg by mouth every Monday. Monday    Historical Provider, MD  tamsulosin (FLOMAX) 0.4 MG CAPS capsule Take 1 capsule (0.4 mg total) by mouth daily. Patient taking differently: Take 0.4 mg by mouth at bedtime.  05/24/13   Greer Pickerel, MD  warfarin (COUMADIN) 5 MG tablet Take 7.5 mg by mouth daily.     Historical Provider, MD   BP 122/79 mmHg  Pulse 85  Temp(Src) 97.5 F (36.4 C) (Oral)  Resp 16  SpO2 99% Physical Exam  Constitutional: He is oriented to person, place, and time. He appears well-developed and well-nourished. No distress.  HENT:  Head: Normocephalic and atraumatic.  Eyes: Conjunctivae are normal.  Neck: Neck supple. No tracheal deviation present.  Cardiovascular: Normal rate and regular rhythm.   Pulmonary/Chest: Effort normal. No respiratory distress.  Abdominal: Soft. He exhibits no distension. There is tenderness (mild suprapubic).  Neurological: He is alert and oriented to person, place, and time.  Skin: Skin is warm and dry.  Psychiatric: He has a normal mood and affect.    ED Course  Procedures (including critical care time) Labs Review Labs Reviewed  URINALYSIS, ROUTINE W REFLEX MICROSCOPIC (NOT AT [facility])    Imaging Review No results found. I have  personally reviewed and evaluated these images and lab results as part of my medical decision-making.   EKG Interpretation None      MDM   Final diagnoses:  Acute urinary retention    74 y.o. male presents with acute urinary retention with known prostate cancer and prior voiding difficulties. On flomax currently. Foley passed by nursing without difficulty and symptoms of distention relieved. No evidence of infection on UA, suspect structural issue or local swelling. Will continue on flomax and leave foley in place to be followed by the patient's urologist for removal/reassessment and further management.     Leo Grosser, MD 09/09/15 [phone]

## 2015-09-09 NOTE — ED Notes (Signed)
Pt reported having dysuria since 2100 and feeling bladder fullness and lower abd pain.

## 2015-09-09 NOTE — ED Notes (Addendum)
Patient states he last voided last night @ 10 pm and has been unable to urinate since that time.  Patient denies hematuria or dysuria yesterday.  Patient has prostate cancer and has had urinary retention in past.  Patient's urologist is at [facility].  Patient also has an acostic neuroma (brain tumor) and is undergoing radiation therapy for that.  Patient is not being treated for prostate cancer.  Patient states it is not difficult to cath him.

## 2015-09-23 ENCOUNTER — Inpatient Hospital Stay (HOSPITAL_COMMUNITY)
Admission: EM | Admit: 2015-09-23 | Discharge: 2015-09-26 | DRG: 086 | Disposition: A | Discharge status: Rehabilitation Facility | Payer: Medicare Other | Attending: Internal Medicine | Admitting: Internal Medicine

## 2015-09-23 ENCOUNTER — Emergency Department (HOSPITAL_COMMUNITY): Payer: Medicare Other

## 2015-09-23 ENCOUNTER — Encounter (HOSPITAL_COMMUNITY): Payer: Self-pay | Admitting: Emergency Medicine

## 2015-09-23 DIAGNOSIS — Y92012 Bathroom of single-family (private) house as the place of occurrence of the external cause: Secondary | ICD-10-CM

## 2015-09-23 DIAGNOSIS — F419 Anxiety disorder, unspecified: Secondary | ICD-10-CM | POA: Diagnosis present

## 2015-09-23 DIAGNOSIS — K921 Melena: Secondary | ICD-10-CM | POA: Diagnosis present

## 2015-09-23 DIAGNOSIS — Z9842 Cataract extraction status, left eye: Secondary | ICD-10-CM | POA: Diagnosis not present

## 2015-09-23 DIAGNOSIS — E119 Type 2 diabetes mellitus without complications: Secondary | ICD-10-CM

## 2015-09-23 DIAGNOSIS — R29810 Facial weakness: Secondary | ICD-10-CM

## 2015-09-23 DIAGNOSIS — Z9181 History of falling: Secondary | ICD-10-CM | POA: Diagnosis not present

## 2015-09-23 DIAGNOSIS — I4891 Unspecified atrial fibrillation: Secondary | ICD-10-CM

## 2015-09-23 DIAGNOSIS — E1142 Type 2 diabetes mellitus with diabetic polyneuropathy: Secondary | ICD-10-CM | POA: Diagnosis present

## 2015-09-23 DIAGNOSIS — E876 Hypokalemia: Secondary | ICD-10-CM

## 2015-09-23 DIAGNOSIS — S065X0D Traumatic subdural hemorrhage without loss of consciousness, subsequent encounter: Secondary | ICD-10-CM | POA: Diagnosis not present

## 2015-09-23 DIAGNOSIS — G510 Bell's palsy: Secondary | ICD-10-CM | POA: Diagnosis present

## 2015-09-23 DIAGNOSIS — E785 Hyperlipidemia, unspecified: Secondary | ICD-10-CM | POA: Diagnosis present

## 2015-09-23 DIAGNOSIS — R338 Other retention of urine: Secondary | ICD-10-CM | POA: Diagnosis present

## 2015-09-23 DIAGNOSIS — D333 Benign neoplasm of cranial nerves: Secondary | ICD-10-CM | POA: Diagnosis present

## 2015-09-23 DIAGNOSIS — N400 Enlarged prostate without lower urinary tract symptoms: Secondary | ICD-10-CM

## 2015-09-23 DIAGNOSIS — R296 Repeated falls: Secondary | ICD-10-CM | POA: Diagnosis present

## 2015-09-23 DIAGNOSIS — Z923 Personal history of irradiation: Secondary | ICD-10-CM | POA: Diagnosis not present

## 2015-09-23 DIAGNOSIS — S065X0S Traumatic subdural hemorrhage without loss of consciousness, sequela: Secondary | ICD-10-CM | POA: Diagnosis not present

## 2015-09-23 DIAGNOSIS — I10 Essential (primary) hypertension: Secondary | ICD-10-CM

## 2015-09-23 DIAGNOSIS — Z961 Presence of intraocular lens: Secondary | ICD-10-CM | POA: Diagnosis present

## 2015-09-23 DIAGNOSIS — S065X1S Traumatic subdural hemorrhage with loss of consciousness of 30 minutes or less, sequela: Secondary | ICD-10-CM | POA: Diagnosis not present

## 2015-09-23 DIAGNOSIS — R29898 Other symptoms and signs involving the musculoskeletal system: Secondary | ICD-10-CM

## 2015-09-23 DIAGNOSIS — S065X9A Traumatic subdural hemorrhage with loss of consciousness of unspecified duration, initial encounter: Secondary | ICD-10-CM

## 2015-09-23 DIAGNOSIS — I6200 Nontraumatic subdural hemorrhage, unspecified: Secondary | ICD-10-CM | POA: Diagnosis not present

## 2015-09-23 DIAGNOSIS — E46 Unspecified protein-calorie malnutrition: Secondary | ICD-10-CM | POA: Diagnosis present

## 2015-09-23 DIAGNOSIS — N401 Enlarged prostate with lower urinary tract symptoms: Secondary | ICD-10-CM | POA: Diagnosis present

## 2015-09-23 DIAGNOSIS — T45511A Poisoning by anticoagulants, accidental (unintentional), initial encounter: Secondary | ICD-10-CM | POA: Diagnosis not present

## 2015-09-23 DIAGNOSIS — F329 Major depressive disorder, single episode, unspecified: Secondary | ICD-10-CM | POA: Diagnosis present

## 2015-09-23 DIAGNOSIS — F418 Other specified anxiety disorders: Secondary | ICD-10-CM | POA: Diagnosis not present

## 2015-09-23 DIAGNOSIS — T45515A Adverse effect of anticoagulants, initial encounter: Secondary | ICD-10-CM | POA: Diagnosis present

## 2015-09-23 DIAGNOSIS — R531 Weakness: Secondary | ICD-10-CM | POA: Diagnosis not present

## 2015-09-23 DIAGNOSIS — D6832 Hemorrhagic disorder due to extrinsic circulating anticoagulants: Secondary | ICD-10-CM | POA: Diagnosis present

## 2015-09-23 DIAGNOSIS — R627 Adult failure to thrive: Secondary | ICD-10-CM

## 2015-09-23 DIAGNOSIS — Z9841 Cataract extraction status, right eye: Secondary | ICD-10-CM

## 2015-09-23 DIAGNOSIS — Z8546 Personal history of malignant neoplasm of prostate: Secondary | ICD-10-CM

## 2015-09-23 DIAGNOSIS — C61 Malignant neoplasm of prostate: Secondary | ICD-10-CM

## 2015-09-23 DIAGNOSIS — E1140 Type 2 diabetes mellitus with diabetic neuropathy, unspecified: Secondary | ICD-10-CM | POA: Diagnosis not present

## 2015-09-23 DIAGNOSIS — Z7984 Long term (current) use of oral hypoglycemic drugs: Secondary | ICD-10-CM

## 2015-09-23 DIAGNOSIS — D689 Coagulation defect, unspecified: Secondary | ICD-10-CM

## 2015-09-23 DIAGNOSIS — D638 Anemia in other chronic diseases classified elsewhere: Secondary | ICD-10-CM | POA: Diagnosis present

## 2015-09-23 DIAGNOSIS — I482 Chronic atrial fibrillation: Secondary | ICD-10-CM | POA: Diagnosis present

## 2015-09-23 DIAGNOSIS — S065X0A Traumatic subdural hemorrhage without loss of consciousness, initial encounter: Secondary | ICD-10-CM | POA: Diagnosis present

## 2015-09-23 DIAGNOSIS — W1839XA Other fall on same level, initial encounter: Secondary | ICD-10-CM | POA: Diagnosis present

## 2015-09-23 DIAGNOSIS — I480 Paroxysmal atrial fibrillation: Secondary | ICD-10-CM | POA: Diagnosis not present

## 2015-09-23 DIAGNOSIS — S065XAA Traumatic subdural hemorrhage with loss of consciousness status unknown, initial encounter: Secondary | ICD-10-CM | POA: Diagnosis present

## 2015-09-23 LAB — COMPREHENSIVE METABOLIC PANEL
ALK PHOS: 83 U/L (ref 38–126)
ALT: 16 U/L — ABNORMAL LOW (ref 17–63)
AST: 22 U/L (ref 15–41)
Albumin: 2.4 g/dL — ABNORMAL LOW (ref 3.5–5.0)
Anion gap: 5 (ref 5–15)
BILIRUBIN TOTAL: 1 mg/dL (ref 0.3–1.2)
BUN: 14 mg/dL (ref 6–20)
CALCIUM: 9.2 mg/dL (ref 8.9–10.3)
CO2: 36 mmol/L — ABNORMAL HIGH (ref 22–32)
Chloride: 95 mmol/L — ABNORMAL LOW (ref 101–111)
Creatinine, Ser: 0.81 mg/dL (ref 0.61–1.24)
GFR calc Af Amer: >60 mL/min (ref >60)
Glucose, Bld: 105 mg/dL — ABNORMAL HIGH (ref 65–99)
POTASSIUM: 3.1 mmol/L — AB (ref 3.5–5.1)
Sodium: 136 mmol/L (ref 135–145)
TOTAL PROTEIN: 5.3 g/dL — AB (ref 6.5–8.1)

## 2015-09-23 LAB — PROTIME-INR
INR: 2.97 — ABNORMAL HIGH (ref 0.00–1.49)
INR: 1.39 (ref 0.00–1.49)
PROTHROMBIN TIME: 30.4 s — AB (ref 11.6–15.2)
Prothrombin Time: 17.1 seconds — ABNORMAL HIGH (ref 11.6–15.2)

## 2015-09-23 LAB — URINALYSIS, ROUTINE W REFLEX MICROSCOPIC
Bilirubin Urine: NEGATIVE
GLUCOSE, UA: NEGATIVE mg/dL
Ketones, ur: NEGATIVE mg/dL
NITRITE: NEGATIVE
PROTEIN: 30 mg/dL — AB
Specific Gravity, Urine: 1.013 (ref 1.005–1.030)
pH: 7 (ref 5.0–8.0)

## 2015-09-23 LAB — MRSA PCR SCREENING: MRSA BY PCR: NEGATIVE

## 2015-09-23 LAB — URINE MICROSCOPIC-ADD ON

## 2015-09-23 LAB — GLUCOSE, CAPILLARY: GLUCOSE-CAPILLARY: 96 mg/dL (ref 65–99)

## 2015-09-23 LAB — CBC
HEMATOCRIT: 32.7 % — AB (ref 39.0–52.0)
HEMOGLOBIN: 10.4 g/dL — AB (ref 13.0–17.0)
MCH: 25.9 pg — ABNORMAL LOW (ref 26.0–34.0)
MCHC: 31.8 g/dL (ref 30.0–36.0)
MCV: 81.3 fL (ref 78.0–100.0)
Platelets: 305 10*3/uL (ref 150–400)
RBC: 4.02 MIL/uL — ABNORMAL LOW (ref 4.22–5.81)
RDW: 15.4 % (ref 11.5–15.5)
WBC: 4.5 10*3/uL (ref 4.0–10.5)

## 2015-09-23 LAB — CBG MONITORING, ED: Glucose-Capillary: 97 mg/dL (ref 65–99)

## 2015-09-23 MED ORDER — METOPROLOL SUCCINATE ER 25 MG PO TB24
25.0000 mg | ORAL_TABLET | Freq: Every day | ORAL | Status: DC
  Administered 2015-09-24 – 2015-09-25 (×2): 25 mg via ORAL
  Filled 2015-09-23 (×2): qty 1

## 2015-09-23 MED ORDER — LORAZEPAM 0.5 MG PO TABS: 0.5000 mg | ORAL_TABLET | Freq: Four times a day (QID) | ORAL | Status: DC | PRN

## 2015-09-23 MED ORDER — PROTHROMBIN COMPLEX CONC HUMAN 500 UNITS IV KIT
1577.0000 [IU] | PACK | Status: AC
  Administered 2015-09-23: 1577 [IU] via INTRAVENOUS
  Filled 2015-09-23: qty 63

## 2015-09-23 MED ORDER — SODIUM CHLORIDE 0.9 % IV SOLN
INTRAVENOUS | Status: DC
  Administered 2015-09-23: 21:00:00 via INTRAVENOUS

## 2015-09-23 MED ORDER — ONDANSETRON HCL 4 MG/2ML IJ SOLN: 4.0000 mg | Freq: Four times a day (QID) | INTRAMUSCULAR | Status: DC | PRN

## 2015-09-23 MED ORDER — TAMSULOSIN HCL 0.4 MG PO CAPS
0.4000 mg | ORAL_CAPSULE | Freq: Every day | ORAL | Status: DC
Start: 2015-09-23 — End: 2015-09-26
  Administered 2015-09-23 – 2015-09-26 (×4): 0.4 mg via ORAL
  Filled 2015-09-23 (×4): qty 1

## 2015-09-23 MED ORDER — AMLODIPINE BESYLATE 5 MG PO TABS
5.0000 mg | ORAL_TABLET | Freq: Every morning | ORAL | Status: DC
  Administered 2015-09-24 – 2015-09-26 (×2): 5 mg via ORAL
  Filled 2015-09-23 (×2): qty 1

## 2015-09-23 MED ORDER — SODIUM CHLORIDE 0.9 % IV SOLN: 250.0000 mL | INTRAVENOUS | Status: DC | PRN

## 2015-09-23 MED ORDER — BUPROPION HCL 100 MG PO TABS
100.0000 mg | ORAL_TABLET | Freq: Two times a day (BID) | ORAL | Status: DC
  Administered 2015-09-23 – 2015-09-26 (×6): 100 mg via ORAL
  Filled 2015-09-23 (×7): qty 1

## 2015-09-23 MED ORDER — POTASSIUM CHLORIDE CRYS ER 20 MEQ PO TBCR
40.0000 meq | EXTENDED_RELEASE_TABLET | Freq: Once | ORAL | Status: AC
  Administered 2015-09-23: 40 meq via ORAL
  Filled 2015-09-23: qty 2

## 2015-09-23 MED ORDER — SODIUM CHLORIDE 0.9 % IV SOLN
INTRAVENOUS | Status: AC
  Administered 2015-09-23: 18:00:00 via INTRAVENOUS

## 2015-09-23 MED ORDER — VITAMIN K1 10 MG/ML IJ SOLN
10.0000 mg | INTRAVENOUS | Status: AC
  Administered 2015-09-23: 10 mg via INTRAVENOUS
  Filled 2015-09-23: qty 1

## 2015-09-23 MED ORDER — ACETAMINOPHEN 325 MG PO TABS
650.0000 mg | ORAL_TABLET | ORAL | Status: DC | PRN
  Administered 2015-09-25: 650 mg via ORAL
  Filled 2015-09-23: qty 2

## 2015-09-23 MED ORDER — DILTIAZEM HCL ER COATED BEADS 240 MG PO CP24
240.0000 mg | ORAL_CAPSULE | Freq: Every morning | ORAL | Status: DC
  Administered 2015-09-24 – 2015-09-26 (×3): 240 mg via ORAL
  Filled 2015-09-23 (×3): qty 1

## 2015-09-23 MED ORDER — BOOST / RESOURCE BREEZE PO LIQD
1.0000 | Freq: Three times a day (TID) | ORAL | Status: DC
  Administered 2015-09-24 – 2015-09-25 (×4): 1 via ORAL
  Filled 2015-09-23 (×9): qty 1

## 2015-09-23 MED ORDER — SODIUM CHLORIDE 0.9 % IV BOLUS (SEPSIS)
1000.0000 mL | Freq: Once | INTRAVENOUS | Status: AC
  Administered 2015-09-23: 1000 mL via INTRAVENOUS

## 2015-09-23 MED ORDER — INSULIN ASPART 100 UNIT/ML ~~LOC~~ SOLN: 0.0000 [IU] | SUBCUTANEOUS | Status: DC

## 2015-09-23 MED ORDER — METOPROLOL TARTRATE 25 MG PO TABS
12.5000 mg | ORAL_TABLET | Freq: Once | ORAL | Status: AC
  Administered 2015-09-23: 12.5 mg via ORAL
  Filled 2015-09-23: qty 1

## 2015-09-23 NOTE — H&P (Signed)
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  Pt has had progressive weakness, dizziness and vertigo with frequent falls.  Ultimately presented 12/4 to ER with worsening weakness BLE>BUE after a fall 3-4 days ago.  He fell in the bathroom and hit his head on the door jam after becoming unsteady on his feet.  No syncope, no LOC. No recent fevers, chest pain, headache, weight loss, cough, dyspnea.  CT head revealed small SDH.  Seen briefly by neurosurgery who requested PCCM admit to neuro ICU for close monitoring but likely no neurosurgical intervention.    PAST MEDICAL HISTORY :  He  has a past medical history of Hypertension; Facial droop; Anxiety; Depression; History of acoustic neurofibromatosis; Deafness in right ear; Prostate cancer ([place]); History of urinary retention; History of prostatitis; Chronic atrial fibrillation ([place]); Dysrhythmia; Type 2 diabetes mellitus ([place]); Complication of anesthesia; Hyperlipidemia; Neuromuscular disorder ([place]); and Prostate cancer ([place]).  PAST SURGICAL HISTORY: He  has past surgical history that includes Cholecystectomy (90's); Retroperitoneal mass excision (03/31/2013); laparotomy (N/A, 03/31/2013); Cardiac catheterization (1990); Inguinal hernia repair (Bilateral, 05/24/2013); Insertion of mesh (Bilateral, 05/24/2013); Umbilical hernia repair (1990's); Craniectomy for excision of acoustic neuroma (Right, 02-12-2001); Hydrocele surgery (Right, 11-26-2007); EXPLORATION FACE RIGHT SIDE (11-29-2001  &  02-06-2003); transthoracic echocardiogram (05-11-2007  (per PCP note)); Eye surgery (Bilateral); and Transrectal ultrasound (N/A,  06/11/2015).  No Known Allergies  No current facility-administered medications on file prior to encounter.   Current Outpatient Prescriptions on File Prior to Encounter  Medication Sig  . acetaminophen (TYLENOL) 500 MG tablet Take 500 mg by mouth every 8 (eight) hours as needed for mild pain.  Marland Kitchen amLODipine (NORVASC) 5 MG tablet Take 5 mg by mouth every morning.   Marland Kitchen buPROPion (WELLBUTRIN) 100 MG tablet Take 100 mg by mouth 2 (two) times daily.   Marland Kitchen diltiazem (CARDIZEM CD) 240 MG 24 hr capsule Take 240 mg by mouth every morning.   . feeding supplement, ENSURE ENLIVE, (ENSURE ENLIVE) LIQD Take 237 mLs by mouth 2 (two) times daily between meals. (Patient taking differently: Take 237 mLs by mouth daily. )  . fenofibrate (TRICOR) 145 MG tablet Take 145 mg by mouth at bedtime.   . fish oil-omega-3 fatty acids 1000 MG capsule Take 1 g by mouth 2 (two) times daily.   Marland Kitchen LORazepam (ATIVAN) 0.5 MG tablet Take 0.5 mg by mouth 2 (two) times daily.  . metFORMIN (GLUCOPHAGE) 500 MG tablet Take 1,000 mg by mouth daily before supper.  . metoprolol succinate (TOPROL-XL) 100 MG 24 hr tablet Take 25 mg by mouth daily. Take with or immediately following a meal.  . tamsulosin (FLOMAX) 0.4 MG CAPS capsule Take 1 capsule (0.4 mg total) by mouth daily. (Patient taking differently: Take 0.8 mg by mouth at bedtime. )  . warfarin (COUMADIN) 5 MG tablet Take 5-7.5 mg by mouth daily. Alternate daily between 5mg  to 7.5mg .  . rosuvastatin (CRESTOR) 5 MG tablet Take 5 mg by mouth every Monday. Monday    FAMILY HISTORY:  His indicated that his mother is deceased. He indicated that his father is deceased. He indicated that his sister is alive.  SOCIAL HISTORY: He  reports that he has never smoked. He has never used smokeless tobacco. He reports that he does not drink alcohol or use illicit drugs.  REVIEW OF SYSTEMS:   As per HPI - All other systems reviewed and were neg.   SUBJECTIVE:  Feeling better.  Denies headache.    VITAL SIGNS: BP 128/82 mmHg  Pulse 118  Temp(Src) 99.2 F (37.3 C) (Oral)  Resp 21  SpO2 98%  HEMODYNAMICS:    VENTILATOR SETTINGS:    INTAKE / OUTPUT:    PHYSICAL EXAMINATION: General:  Frail, chronically ill appearing male, NAD  Neuro:  Awake, alert, appropriate, oriented, generalized weakness BLE>BUE.  Mild L arm drift, L facial droop (chronic), PERRL HEENT:  Mm moist, no JVD Cardiovascular:  s1s2 irreg Lungs:  resps even non labored on RA, diminished bases Abdomen:  Soft, non tender, +bs  Musculoskeletal:  Warm and dry, no edema  LABS:  BMET  Recent Labs Lab 09/23/15 1545  NA 136  K 3.1*  CL 95*  CO2 36*  BUN 14  CREATININE 0.81  GLUCOSE 105*    Electrolytes  Recent Labs Lab 09/23/15 1545  CALCIUM 9.2    CBC  Recent Labs Lab 09/23/15 1545  WBC 4.5  HGB 10.4*  HCT 32.7*  PLT 305    Coag's  Recent Labs Lab 09/23/15 1545  INR 2.97*    Sepsis Markers No results for input(s): LATICACIDVEN, PROCALCITON, O2SATVEN in the last 168 hours.  ABG No results for input(s): PHART, PCO2ART, PO2ART in the last 168 hours.  Liver Enzymes  Recent Labs Lab 09/23/15 1545  AST 22  ALT 16*  ALKPHOS 83  BILITOT 1.0  ALBUMIN 2.4*    Cardiac Enzymes No results for input(s): TROPONINI, PROBNP in the last 168 hours.  Glucose  Recent Labs Lab 09/23/15 1442  GLUCAP 97    Imaging Ct Head Wo Contrast  09/23/2015  CLINICAL DATA:  New onset of weakness. Patient is anticoagulated, for atrial fibrillation. Multiple recent falls. History of recurrent vestibular schwannoma. EXAM: CT HEAD WITHOUT CONTRAST TECHNIQUE: Contiguous axial images were obtained from the base of the skull through the vertex without intravenous contrast. COMPARISON:  MR brain 08/07/2015. FINDINGS: Since the previous exam, the patient has developed an asymmetric extra-axial collection over the RIGHT frontal convexity, 6-7 mm thickness, displaying low to intermediate attenuation  (25-44 Hounsfield units.) This was not present on recent MR, and is consistent with a subacute RIGHT subdural hematoma. No significant midline shift, but there is mild mass effect on the RIGHT frontal cortex. There is no definite LEFT-sided collection. Prominence of the extra-axial spaces on the LEFT is felt to represent atrophy. Generalized atrophy. Hypoattenuation of white matter representing chronic microvascular ischemic change. RIGHT suboccipital craniectomy and surgical encephalomalacia of the RIGHT lateral cerebellum. Soft tissue within the flared RIGHT IAC represents recurrent vestibular schwannoma. Cavernous carotid vascular calcification. Cataract extraction. No sinus air-fluid level. Negative mastoids. IMPRESSION: Interval development since October 2016 MR of a 6-7 mm subacute subdural hematoma on the RIGHT. No midline shift, but slight mass effect on the frontal cortex. Neurosurgical consultation may be warranted. Findings discussed with ordering provider. Electronically Signed   By: Staci Righter M.D.   On: 09/23/2015 16:59   Dg Chest Port 1 View  09/23/2015  CLINICAL DATA:  Weakness, possible CVA. EXAM: PORTABLE CHEST 1 VIEW COMPARISON:  02/14/2013 FINDINGS: Cardiomediastinal silhouette is normal. Mediastinal contours appear intact. Atherosclerotic disease of the aortic arch is noted. There is no  evidence of focal airspace consolidation, pleural effusion or pneumothorax. There is a eventration of the right hemidiaphragm. Osseous structures are without acute abnormality. The bilateral humeral heads are high riding, suggestive of chronic rotator cuff injuries. Soft tissues are grossly normal. IMPRESSION: No radiographic evidence of acute cardiopulmonary abnormality. Atherosclerotic disease of the aorta. Electronically Signed   By: Fidela Salisbury M.D.   On: 09/23/2015 17:00     STUDIES:  CT head 12/4>>> interval development of 6-56mm R SDH, no midline shift, slight mass effect on frontal  cortex.   CULTURES:   ANTIBIOTICS:   SIGNIFICANT EVENTS:   LINES/TUBES:   DISCUSSION: 74yo male with hx recurrent acoustic neuroma, Afib on coumadin presented 12/4 with SDH after fall at home.     ASSESSMENT / PLAN:   NEUROLOGIC SDH  Acoustic neuroma  Generalized weakness/ frequent falls  Anxiety  P:   Reverse coumadin as above  Neurosurgery following  Neuro checks  PRN ativan  F/u CT head 12/6   PULMONARY No active issue  P:   Pulmonary hygiene   CARDIOVASCULAR Afib  Hx HTN  P:  Hold coumadin - will need to be off at least 4-6 weeks per nsgy - but seems to be poor anticoagulation candidate in general given frequent falls.  Resume home cardizem, metoprolol, norvasc  RENAL Hypokalemia  P:   Replete K  F/u chem in am   GASTROINTESTINAL No active issue  P:   Bedside RN swallow screen  Clear liquids   HEMATOLOGIC coagulapathy  P:  F/u CBC  Reverse coumadin - FFP, KCentra given in ER   Repeat INR now and in am   INFECTIOUS No active issue  P:   Monitor wbc, fever curve off abx   ENDOCRINE DM  P:   SSI     FAMILY  - Updates:  Pt updated at length 12/4    Nickolas Madrid, NP 09/23/2015  9:09 PM Pager: [phone] or [phone]

## 2015-09-23 NOTE — Consult Note (Signed)
Reason for Consult:  Right frontal subacute subdural hematoma Referring Physician:  Dr. Kevin Steinl  Tommy Weeks is an 74 y.o. male.  HPI: Patient presented to the [facility] emergency room earlier today, for evaluation of weakness. He explains that he has weakness in the lower extremities, and to a lesser extent in the upper extremities. He denies any greater weakness on one side or the other. He does note though 3 falls over the past 2 weeks, most significant occurred about 3 days ago, when he fell in the bathroom, hitting his head against the door jam. He denies headaches or focal weakness, but rather a more generalized weakness, lower extremities more so than upper extremities.  His history is notable for resection of a vestibular schwannoma in 2002 by Dr. Mark Roy, that left him with a right peripheral facial weakness and loss of hearing. Subsequent difficulties this summer were evaluated with MRI which revealed recurrence of tumor, and he was seen by Dr. Kyle Cabbell, who treated the patient with stereotactic radiosurgery, in conjunction with Dr. John Moody from radiation oncology, in October of this year.  His history is also notable for atrial fibrillation, for which is been anticoagulated with Coumadin. He reports that he has since anticoagulation checked monthly.   Dr. Steinl evaluated the patient in the emergency room with CT the brain without contrast, that revealed a right frontal isodense (subacute) subdural hematoma, without significant mass effect. He also obtained laboratories revealed excessive anticoagulation with Coumadin, with an INR of 2.97.  I was consulted by Dr. Steinl, who described the patient's neurologic condition. I reviewed the CT and recommended admission to the critical care medicine service, transfer to the neurosurgical ICU, and reversal of his iatrogenic coagulopathy.    Past Medical History:  Past Medical History  Diagnosis Date  . Hypertension   . Facial  droop     RIGHT SIDE due to brain surgery 2002  . Anxiety   . Depression   . 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 Prostate cancer ([facility])   . History of urinary retention   . History of prostatitis   . Chronic atrial fibrillation ([facility])   . Dysrhythmia   . Type 2 diabetes mellitus ([facility])     NIDDM  . Complication of anesthesia     urinary retention  . Hyperlipidemia   . Neuromuscular disorder ([facility])     imbalance s/p craniectomy  . Prostate cancer ([facility])     Past Surgical History:  Past Surgical History  Procedure Laterality Date  . Cholecystectomy  90's  . Retroperitoneal mass excision  03/31/2013  . Laparotomy N/A 03/31/2013    Procedure: Open resection of abdominal mass;  Surgeon: Faera Byerly, MD;  Location: [facility] OR;  Service: General;  Laterality: N/A;  . Cardiac catheterization  1990  . Inguinal hernia repair Bilateral 05/24/2013    Procedure: bilateral open HERNIA REPAIR INGUINAL ADULT;  Surgeon: Eric M Wilson, MD;  Location: [facility]S;  Service: General;  Laterality: Bilateral;  . Insertion of mesh Bilateral 05/24/2013    Procedure: INSERTION OF MESH;  Surgeon: Eric M Wilson, MD;  Location: [facility];  Service: General;  Laterality: Bilateral;  . Umbilical hernia repair  1990's  . Craniectomy for excision of acoustic neuroma Right 02-12-2001  . Hydrocele excision Right 11-26-2007  .   Exploration face right side  11-29-2001  &  02-06-2003    post craniotomy resection acoustic neuroma--  facial paralysis  . Transthoracic echocardiogram  05-11-2007  (per PCP note)    ef 55-65%,  mild TR and MR  . Eye surgery Bilateral     cataract OU w/ IOL  . Transrectal ultrasound N/A 06/11/2015    Procedure: TRANSRECTAL ULTRASOUND AND BIOPSY;  Surgeon: Mark Ottelin, MD;  Location: [facility];  Service: Urology;  Laterality: N/A;     Family History: History reviewed. No pertinent family history.  Social History:  reports that he has never smoked. He has never used smokeless tobacco. He reports that he does not drink alcohol or use illicit drugs.  Allergies: No Known Allergies  Medications: I have reviewed the patient's current medications.  ROS: Notable for those difficulties described in his history of present illness and past medical history, but is otherwise unremarkable.   Physical Examination: Well developed, well nourished white male in no acute distress.  Blood pressure 128/82, pulse 118, temperature 99.1 F (37.3 C), temperature source Oral, resp. rate 21, SpO2 98 %. Lungs: clear to auscultation, he has symmetrical respiratory excursion. Heart: Regular rate and rhythm, normal S1 and S2, there is no murmur heard. Abdomen: Soft, nontender, bowel sounds present. Extremity:  No clubbing, cyanosis, or edema.  Neurological Examination: Mental Status Examination: Awake, alert, oriented to name, [facility], and December 2016. Cranial Nerve Examination: Pupils 3 mm bilaterally, round, reactive to light. Right ptosis, otherwise EOMI. Facial sensation intact. Right peripheral seventh nerve palsy (chronic) evidence of atrophy. Absent hearing on the right. Intact hearing on the left.  Motor Examination:  5/5 strength in the upper extremities, with no drift of the upper extremities.  Mild weakness of the left iliopsoas as compared to the right iliopsoas (5/5). The dorsiflexor and plantar flexor are 5/5 bilaterally.  Sensory Examination:  Intact to pinprick throughout. Reflex Examination:  Symmetrical, but diminished in the upper and lower extremities  Gait and Stance Examination:  Not tested due to the nature the patient's condition.    Results for orders placed or performed during the hospital encounter of 09/23/15 (from the past 48 hour(s))  CBG monitoring, ED     Status: None   Collection Time: 09/23/15   2:42 PM  Result Value Ref Range   Glucose-Capillary 97 65 - 99 mg/dL  CBC     Status: Abnormal   Collection Time: 09/23/15  3:45 PM  Result Value Ref Range   WBC 4.5 4.0 - 10.5 K/uL   RBC 4.02 (L) 4.22 - 5.81 MIL/uL   Hemoglobin 10.4 (L) 13.0 - 17.0 g/dL   HCT 32.7 (L) 39.0 - 52.0 %   MCV 81.3 78.0 - 100.0 fL   MCH 25.9 (L) 26.0 - 34.0 pg   MCHC 31.8 30.0 - 36.0 g/dL   RDW 15.4 11.5 - 15.5 %   Platelets 305 150 - 400 K/uL  Comprehensive metabolic panel     Status: Abnormal   Collection Time: 09/23/15  3:45 PM  Result Value Ref Range   Sodium 136 135 - 145 mmol/L   Potassium 3.1 (L) 3.5 - 5.1 mmol/L   Chloride 95 (L) 101 - 111 mmol/L   CO2 36 (H) 22 - 32 mmol/L   Glucose, Bld 105 (H) 65 - 99 mg/dL   BUN 14 6 - 20 mg/dL   Creatinine, Ser 0.81 0.61 - 1.24 mg/dL   Calcium 9.2 8.9 - 10.3 mg/dL     Total Protein 5.3 (L) 6.5 - 8.1 g/dL   Albumin 2.4 (L) 3.5 - 5.0 g/dL   AST 22 15 - 41 U/L   ALT 16 (L) 17 - 63 U/L   Alkaline Phosphatase 83 38 - 126 U/L   Total Bilirubin 1.0 0.3 - 1.2 mg/dL   GFR calc non Af Amer >60 >60 mL/min   GFR calc Af Amer >60 >60 mL/min    Comment: (NOTE) The eGFR has been calculated using the CKD EPI equation. This calculation has not been validated in all clinical situations. eGFR's persistently <60 mL/min signify possible Chronic Kidney Disease.    Anion gap 5 5 - 15  Protime-INR     Status: Abnormal   Collection Time: 09/23/15  3:45 PM  Result Value Ref Range   Prothrombin Time 30.4 (H) 11.6 - 15.2 seconds   INR 2.97 (H) 0.00 - 1.49  Urinalysis, Routine w reflex microscopic (not at [facility])     Status: Abnormal   Collection Time: 09/23/15  4:48 PM  Result Value Ref Range   Color, Urine YELLOW YELLOW   APPearance CLOUDY (A) CLEAR   Specific Gravity, Urine 1.013 1.005 - 1.030   pH 7.0 5.0 - 8.0   Glucose, UA NEGATIVE NEGATIVE mg/dL   Hgb urine dipstick LARGE (A) NEGATIVE   Bilirubin Urine NEGATIVE NEGATIVE   Ketones, ur NEGATIVE NEGATIVE mg/dL    Protein, ur 30 (A) NEGATIVE mg/dL   Nitrite NEGATIVE NEGATIVE   Leukocytes, UA MODERATE (A) NEGATIVE  Urine microscopic-add on     Status: Abnormal   Collection Time: 09/23/15  4:48 PM  Result Value Ref Range   Squamous Epithelial / LPF 0-5 (A) NONE SEEN   WBC, UA 6-30 0 - 5 WBC/hpf   RBC / HPF TOO NUMEROUS TO COUNT 0 - 5 RBC/hpf   Bacteria, UA FEW (A) NONE SEEN   Crystals CA OXALATE CRYSTALS (A) NEGATIVE   Urine-Other MUCOUS PRESENT     Ct Head Wo Contrast  09/23/2015  CLINICAL DATA:  New onset of weakness. Patient is anticoagulated, for atrial fibrillation. Multiple recent falls. History of recurrent vestibular schwannoma. EXAM: CT HEAD WITHOUT CONTRAST TECHNIQUE: Contiguous axial images were obtained from the base of the skull through the vertex without intravenous contrast. COMPARISON:  MR brain 08/07/2015. FINDINGS: Since the previous exam, the patient has developed an asymmetric extra-axial collection over the RIGHT frontal convexity, 6-7 mm thickness, displaying low to intermediate attenuation (25-44 Hounsfield units.) This was not present on recent MR, and is consistent with a subacute RIGHT subdural hematoma. No significant midline shift, but there is mild mass effect on the RIGHT frontal cortex. There is no definite LEFT-sided collection. Prominence of the extra-axial spaces on the LEFT is felt to represent atrophy. Generalized atrophy. Hypoattenuation of white matter representing chronic microvascular ischemic change. RIGHT suboccipital craniectomy and surgical encephalomalacia of the RIGHT lateral cerebellum. Soft tissue within the flared RIGHT IAC represents recurrent vestibular schwannoma. Cavernous carotid vascular calcification. Cataract extraction. No sinus air-fluid level. Negative mastoids. IMPRESSION: Interval development since October 2016 MR of a 6-7 mm subacute subdural hematoma on the RIGHT. No midline shift, but slight mass effect on the frontal cortex. Neurosurgical  consultation may be warranted. Findings discussed with ordering provider. Electronically Signed   By: Staci Righter M.D.   On: 09/23/2015 16:59   Dg Chest Port 1 View  09/23/2015  CLINICAL DATA:  Weakness, possible CVA. EXAM: PORTABLE CHEST 1 VIEW COMPARISON:  02/14/2013 FINDINGS: Cardiomediastinal silhouette is normal.  Mediastinal contours appear intact. Atherosclerotic disease of the aortic arch is noted. There is no evidence of focal airspace consolidation, pleural effusion or pneumothorax. There is a eventration of the right hemidiaphragm. Osseous structures are without acute abnormality. The bilateral humeral heads are high riding, suggestive of chronic rotator cuff injuries. Soft tissues are grossly normal. IMPRESSION: No radiographic evidence of acute cardiopulmonary abnormality. Atherosclerotic disease of the aorta. Electronically Signed   By: Dobrinka  Dimitrova M.D.   On: 09/23/2015 17:00     Assessment/Plan: Patient with a history of a vestibular schwannoma, resected in 2002 by Dr. Mark Roy, treated with SRS for recurrence in October 2016 by Drs. Kyle Cabbell and John Moody.  Presents earlier today with complaints of weakness, and has been found to have a small right frontal subdural hematoma, without significant mass effect. However he has been anticoagulated with Coumadin, for several years, because of atrial fibrillation . Reversal of his anticoagulation has been initiated, and the patient is being admitted to the neurosurgical intensive care unit by CCM.   At this time there is no indication for surgical intervention. I anticipate that he will take 1-2 months for this subacute subdural hematoma clear. During this time he will not be able to receive anticoagulation or antiplatelet therapy for his atrial fibrillation. We will plan on a follow-up CT of the brain on the morning of Tuesday, December 6.  Dr. Cabbell will resume the patient's care, however if the patient continues to stabilize, and  his CT is stable, he will most likely be able to be followed as an outpatient.  However I do feel that he would benefit from PT and OT in light of his several falls over the past couple weeks and the weakness that I found on exam in the proximal left lower extremity.   At this time with the patient being neurologically stable, I believe that he can be started on clear liquids this evening, and if he remains stable, advanced to a regular diet in the morning.  NUDELMAN,ROBERT W, MD 09/23/2015, 8:33 PM    

## 2015-09-23 NOTE — ED Notes (Signed)
Bed: [id number] Expected date: 09/23/15 Expected time: 2:23 PM Means of arrival: Ambulance Comments: Weakness, brain tumor

## 2015-09-23 NOTE — Discharge Instructions (Signed)
Transfer/admit to neuro icu at [facility].

## 2015-09-23 NOTE — ED Provider Notes (Addendum)
CSN: [id number]     Arrival date & time 09/23/15  1427 History   First MD Initiated Contact with Patient 09/23/15 1506     Chief Complaint  Patient presents with  . Weakness     (Consider location/radiation/quality/duration/timing/severity/associated sxs/prior Treatment) Patient is a 74 y.o. male presenting with weakness. The history is provided by the patient and a relative.  Weakness Pertinent negatives include no chest pain, no abdominal pain, no headaches and no shortness of breath.  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 Unspecific weight loss in past few months, decreased appetite, generally weak.  Symptoms constant, persistent since. No acute or abrupt change today. No fever or chills. No vomiting.  + diarrhea for past few days, few episodes per day. No recent abx use.  Hx prostate ca w recurrent problems w urinary retention, has had catheter in previously, and currently x 1 month.  Notes several falls in past couple weeks, tries to walk w walker, but unsteady. Denies loc. Pt denies any pain. No headache. No neck or back pain.       Past Medical History  Diagnosis Date  . Hypertension   . 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 Anxiety   . Depression   . 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 Prostate cancer ([place])   . History of urinary retention   . History of prostatitis   . Chronic atrial fibrillation ([place])   . Dysrhythmia   . Type 2 diabetes mellitus (HCC)     NIDDM  . Complication of anesthesia     urinary retention  . Hyperlipidemia   . Neuromuscular disorder ([place])     imbalance s/p craniectomy  . Prostate cancer [facility])    Past Surgical History  Procedure Laterality Date  .  Cholecystectomy  90's  . Retroperitoneal mass excision  03/31/2013  . Laparotomy N/A 03/31/2013    Procedure: Open resection of abdominal mass;  Surgeon: Stark Klein, MD;  Location: [place];  Service: General;  Laterality: N/A;  . Cardiac catheterization  1990  . Inguinal hernia repair Bilateral 05/24/2013    Procedure: bilateral open HERNIA REPAIR INGUINAL ADULT;  Surgeon: Gayland Curry, MD;  Location: [facility];  Service: General;  Laterality: Bilateral;  . Insertion of mesh Bilateral 05/24/2013    Procedure: INSERTION OF MESH;  Surgeon: Gayland Curry, MD;  Location: [facility];  Service: General;  Laterality: Bilateral;  . Umbilical hernia repair  1990's  . Craniectomy for excision of acoustic neuroma Right 02-12-2001  . Hydrocele excision Right 11-26-2007  . Exploration face right side  11-29-2001  &  02-06-2003    post craniotomy resection acoustic neuroma--  facial paralysis  . Transthoracic echocardiogram  05-11-2007  (per PCP note)    ef 55-65%,  mild TR and MR  . Eye surgery Bilateral     cataract OU w/ IOL  . Transrectal ultrasound N/A 06/11/2015    Procedure: TRANSRECTAL ULTRASOUND AND BIOPSY;  Surgeon: Kathie Rhodes, MD;  Location: [facility];  Service: Urology;  Laterality: N/A;   History reviewed. No pertinent family history. Social History  Substance Use Topics  . Smoking status: Never Smoker   .  Smokeless tobacco: Never Used  . Alcohol Use: No    Review of Systems  Constitutional: Negative for fever and chills.  HENT: Negative for sore throat.   Eyes: Negative for visual disturbance.  Respiratory: Negative for cough and shortness of breath.   Cardiovascular: Negative for chest pain and leg swelling.  Gastrointestinal: Positive for diarrhea. Negative for vomiting and abdominal pain.       Few loose stools per day for past couple days. Denies known ill contacts, travel or recent abx use.   Genitourinary: Negative for dysuria and flank pain.  Musculoskeletal:  Negative for back pain and neck pain.  Skin: Negative for rash.  Neurological: Positive for weakness. Negative for headaches.  Hematological: Does not bruise/bleed easily.  Psychiatric/Behavioral: Negative for confusion.      Allergies  Review of patient's allergies indicates no known allergies.  Home Medications   Prior to Admission medications   Medication Sig Start Date End Date Taking? Authorizing Provider  acetaminophen (TYLENOL) 500 MG tablet Take 500 mg by mouth every 8 (eight) hours as needed for mild pain.   Yes Historical Provider, MD  amLODipine (NORVASC) 5 MG tablet Take 5 mg by mouth every morning.    Yes Historical Provider, MD  buPROPion (WELLBUTRIN) 100 MG tablet Take 100 mg by mouth 2 (two) times daily.    Yes Historical Provider, MD  diltiazem (CARDIZEM CD) 240 MG 24 hr capsule Take 240 mg by mouth every morning.    Yes Historical Provider, MD  feeding supplement, ENSURE ENLIVE, (ENSURE ENLIVE) LIQD Take 237 mLs by mouth 2 (two) times daily between meals. Patient taking differently: Take 237 mLs by mouth daily.  07/06/15  Yes Jani Gravel, MD  fenofibrate (TRICOR) 145 MG tablet Take 145 mg by mouth at bedtime.    Yes Historical Provider, MD  fish oil-omega-3 fatty acids 1000 MG capsule Take 1 g by mouth 2 (two) times daily.    Yes Historical Provider, MD  LORazepam (ATIVAN) 0.5 MG tablet Take 0.5 mg by mouth 2 (two) times daily.   Yes Historical Provider, MD  metFORMIN (GLUCOPHAGE) 500 MG tablet Take 1,000 mg by mouth daily before supper.   Yes Historical Provider, MD  metoprolol succinate (TOPROL-XL) 100 MG 24 hr tablet Take 25 mg by mouth daily. Take with or immediately following a meal.   Yes Historical Provider, MD  tamsulosin (FLOMAX) 0.4 MG CAPS capsule Take 1 capsule (0.4 mg total) by mouth daily. Patient taking differently: Take 0.8 mg by mouth at bedtime.  05/24/13  Yes Greer Pickerel, MD  warfarin (COUMADIN) 5 MG tablet Take 5-7.5 mg by mouth daily. Alternate daily  between 5mg  to 7.5mg .   Yes Historical Provider, MD  rosuvastatin (CRESTOR) 5 MG tablet Take 5 mg by mouth every Monday. Monday    Historical Provider, MD   BP 111/72 mmHg  Pulse 96  Temp(Src) 99.1 F (37.3 C) (Oral)  Resp 20  SpO2 98% Physical Exam  Constitutional: He is oriented to person, place, and time. He appears well-developed and well-nourished. No distress.  HENT:  Head: Atraumatic.  Mouth/Throat: Oropharynx is clear and moist.  Eyes: Conjunctivae are normal. Pupils are equal, round, and reactive to light. No scleral icterus.  Neck: Neck supple. No tracheal deviation present. No thyromegaly present.  No stiffness or rigidity  Cardiovascular: Normal rate, normal heart sounds and intact distal pulses.   No murmur heard. Pulmonary/Chest: Effort normal and breath sounds normal. No accessory muscle usage. No respiratory distress.  Abdominal:  Soft. Bowel sounds are normal. He exhibits no distension and no mass. There is no tenderness. There is no rebound and no guarding.  Genitourinary:  No cva tenderness  Musculoskeletal: Normal range of motion. He exhibits no edema.  CTLS spine, non tender, aligned, no step off.Kermit Balo rom bil ext without pain or focal bony tenderness.   Neurological: He is alert and oriented to person, place, and time.  Right facial weakness (chronic), from prior surgery/AN. Motor intact bil ext, 4/5 bil, sens grossly intact.   Skin: Skin is warm and dry. He is not diaphoretic.  Psychiatric: He has a normal mood and affect.  Nursing note and vitals reviewed.   ED Course  Procedures (including critical care time) Labs Review   Results for orders placed or performed during the hospital encounter of 09/23/15  CBC  Result Value Ref Range   WBC 4.5 4.0 - 10.5 K/uL   RBC 4.02 (L) 4.22 - 5.81 MIL/uL   Hemoglobin 10.4 (L) 13.0 - 17.0 g/dL   HCT 32.7 (L) 39.0 - 52.0 %   MCV 81.3 78.0 - 100.0 fL   MCH 25.9 (L) 26.0 - 34.0 pg   MCHC 31.8 30.0 - 36.0 g/dL    RDW 15.4 11.5 - 15.5 %   Platelets 305 150 - 400 K/uL  Comprehensive metabolic panel  Result Value Ref Range   Sodium 136 135 - 145 mmol/L   Potassium 3.1 (L) 3.5 - 5.1 mmol/L   Chloride 95 (L) 101 - 111 mmol/L   CO2 36 (H) 22 - 32 mmol/L   Glucose, Bld 105 (H) 65 - 99 mg/dL   BUN 14 6 - 20 mg/dL   Creatinine, Ser 0.81 0.61 - 1.24 mg/dL   Calcium 9.2 8.9 - 10.3 mg/dL   Total Protein 5.3 (L) 6.5 - 8.1 g/dL   Albumin 2.4 (L) 3.5 - 5.0 g/dL   AST 22 15 - 41 U/L   ALT 16 (L) 17 - 63 U/L   Alkaline Phosphatase 83 38 - 126 U/L   Total Bilirubin 1.0 0.3 - 1.2 mg/dL   GFR calc non Af Amer >60 >60 mL/min   GFR calc Af Amer >60 >60 mL/min   Anion gap 5 5 - 15  Protime-INR  Result Value Ref Range   Prothrombin Time 30.4 (H) 11.6 - 15.2 seconds   INR 2.97 (H) 0.00 - 1.49  CBG monitoring, ED  Result Value Ref Range   Glucose-Capillary 97 65 - 99 mg/dL   Ct Head Wo Contrast  09/23/2015  CLINICAL DATA:  New onset of weakness. Patient is anticoagulated, for atrial fibrillation. Multiple recent falls. History of recurrent vestibular schwannoma. EXAM: CT HEAD WITHOUT CONTRAST TECHNIQUE: Contiguous axial images were obtained from the base of the skull through the vertex without intravenous contrast. COMPARISON:  MR brain 08/07/2015. FINDINGS: Since the previous exam, the patient has developed an asymmetric extra-axial collection over the RIGHT frontal convexity, 6-7 mm thickness, displaying low to intermediate attenuation (25-44 Hounsfield units.) This was not present on recent MR, and is consistent with a subacute RIGHT subdural hematoma. No significant midline shift, but there is mild mass effect on the RIGHT frontal cortex. There is no definite LEFT-sided collection. Prominence of the extra-axial spaces on the LEFT is felt to represent atrophy. Generalized atrophy. Hypoattenuation of white matter representing chronic microvascular ischemic change. RIGHT suboccipital craniectomy and surgical  encephalomalacia of the RIGHT lateral cerebellum. Soft tissue within the flared RIGHT IAC represents recurrent vestibular schwannoma. Cavernous carotid  vascular calcification. Cataract extraction. No sinus air-fluid level. Negative mastoids. IMPRESSION: Interval development since October 2016 MR of a 6-7 mm subacute subdural hematoma on the RIGHT. No midline shift, but slight mass effect on the frontal cortex. Neurosurgical consultation may be warranted. Findings discussed with ordering provider. Electronically Signed   By: Staci Righter M.D.   On: 09/23/2015 16:59   Dg Chest Port 1 View  09/23/2015  CLINICAL DATA:  Weakness, possible CVA. EXAM: PORTABLE CHEST 1 VIEW COMPARISON:  02/14/2013 FINDINGS: Cardiomediastinal silhouette is normal. Mediastinal contours appear intact. Atherosclerotic disease of the aortic arch is noted. There is no evidence of focal airspace consolidation, pleural effusion or pneumothorax. There is a eventration of the right hemidiaphragm. Osseous structures are without acute abnormality. The bilateral humeral heads are high riding, suggestive of chronic rotator cuff injuries. Soft tissues are grossly normal. IMPRESSION: No radiographic evidence of acute cardiopulmonary abnormality. Atherosclerotic disease of the aorta. Electronically Signed   By: Fidela Salisbury M.D.   On: 09/23/2015 17:00      I have personally reviewed and evaluated these images and lab results as part of my medical decision-making.   EKG Interpretation   Date/Time:  Sunday September 23 2015 14:52:15 EST Ventricular Rate:  110 PR Interval:    QRS Duration: 81 QT Interval:  400 QTC Calculation: 541 R Axis:   37 Text Interpretation:  Atrial fibrillation Nonspecific T wave abnormality  No significant change since last tracing Confirmed by Tamarius Rosenfield  MD, Lennette Bihari  ([id number]) on 09/23/2015 5:06:15 PM      MDM   Iv ns. Monitor. Labs.  Reviewed nursing notes and prior charts for additional history.    Recheck, no change in neuro exam from prior.  Pt denies headache. ctls spine nt.   Ct noted.  Pt currently unable to ambulate, or safely live at home w spouse (w hx dementia) - family had been considering ecf - during admission, pt will need CM consult, re eventual placement issues.  Neurosurgery consulted re sdh, pt is on coumadin.   Dr Sherwood Gambler reviewed ct and inr - he requests we reverse coumadin (kcentra protocol) and admit to neuro icu at [facility], to pccm.   pccm consulted.   CRITICAL CARE  RE: subdural hematoma, coagulopathy due to coumadin, falls, weakness,  Performed by: Mirna Mires Total critical care time: 35 minutes Critical care time was exclusive of separately billable procedures and treating other patients. Critical care was necessary to treat or prevent imminent or life-threatening deterioration. Critical care was time spent personally by me on the following activities: development of treatment plan with patient and/or surrogate as well as nursing, discussions with consultants, evaluation of patient's response to treatment, examination of patient, obtaining history from patient or surrogate, ordering and performing treatments and interventions, ordering and review of laboratory studies, ordering and review of radiographic studies, pulse oximetry and re-evaluation of patient's condition.  Dr Vaughan Browner accepts to neuro icu at [facility].       Lajean Saver, MD 09/23/15 1739

## 2015-09-23 NOTE — ED Notes (Addendum)
Per EMS: pt has new onset of weakness, causing him not to walk, stuttering in speceh that started this morning. Pt also has diarrhea. Pt has had a loss in appetite. Pt has been recently diagnosed with a brain tumor.

## 2015-09-24 ENCOUNTER — Encounter (HOSPITAL_COMMUNITY): Payer: Self-pay | Admitting: *Deleted

## 2015-09-24 DIAGNOSIS — R531 Weakness: Secondary | ICD-10-CM | POA: Insufficient documentation

## 2015-09-24 DIAGNOSIS — I6200 Nontraumatic subdural hemorrhage, unspecified: Secondary | ICD-10-CM

## 2015-09-24 LAB — BASIC METABOLIC PANEL
ANION GAP: 7 (ref 5–15)
BUN: 8 mg/dL (ref 6–20)
CHLORIDE: 98 mmol/L — AB (ref 101–111)
CO2: 31 mmol/L (ref 22–32)
Calcium: 8.4 mg/dL — ABNORMAL LOW (ref 8.9–10.3)
Creatinine, Ser: 0.81 mg/dL (ref 0.61–1.24)
GFR calc Af Amer: >60 mL/min (ref >60)
GLUCOSE: 98 mg/dL (ref 65–99)
POTASSIUM: 3.6 mmol/L (ref 3.5–5.1)
Sodium: 136 mmol/L (ref 135–145)

## 2015-09-24 LAB — CBC
HCT: 31.7 % — ABNORMAL LOW (ref 39.0–52.0)
HEMOGLOBIN: 9.8 g/dL — AB (ref 13.0–17.0)
MCH: 25.8 pg — AB (ref 26.0–34.0)
MCHC: 30.9 g/dL (ref 30.0–36.0)
MCV: 83.4 fL (ref 78.0–100.0)
Platelets: 272 10*3/uL (ref 150–400)
RBC: 3.8 MIL/uL — AB (ref 4.22–5.81)
RDW: 15.6 % — ABNORMAL HIGH (ref 11.5–15.5)
WBC: 4 10*3/uL (ref 4.0–10.5)

## 2015-09-24 LAB — GLUCOSE, CAPILLARY
GLUCOSE-CAPILLARY: 84 mg/dL (ref 65–99)
Glucose-Capillary: 86 mg/dL (ref 65–99)
Glucose-Capillary: 84 mg/dL (ref 65–99)
Glucose-Capillary: 114 mg/dL — ABNORMAL HIGH (ref 65–99)
Glucose-Capillary: 135 mg/dL — ABNORMAL HIGH (ref 65–99)
Glucose-Capillary: 133 mg/dL — ABNORMAL HIGH (ref 65–99)

## 2015-09-24 LAB — PROTIME-INR
INR: 1.36 (ref 0.00–1.49)
Prothrombin Time: 16.9 seconds — ABNORMAL HIGH (ref 11.6–15.2)

## 2015-09-24 MED ORDER — INSULIN ASPART 100 UNIT/ML ~~LOC~~ SOLN: 0.0000 [IU] | Freq: Every day | SUBCUTANEOUS | Status: DC

## 2015-09-24 MED ORDER — INSULIN ASPART 100 UNIT/ML ~~LOC~~ SOLN
0.0000 [IU] | Freq: Three times a day (TID) | SUBCUTANEOUS | Status: DC
  Administered 2015-09-24 – 2015-09-25 (×2): 2 [IU] via SUBCUTANEOUS

## 2015-09-24 NOTE — Care Management Note (Signed)
Case Management Note  Patient Details  Name: AESON SGRO MRN: [id number] Date of Birth: [birth year]  Subjective/Objective:    Pt admitted on 09/23/15 with SDH s/p fall.  PTA, pt resided at home with spouse.  PT/OT consults pending; pt has had multiple falls, per report.                   Action/Plan: Will follow for discharge planning as pt progresses.  Await PT/OT recommendations.    Expected Discharge Date:                  Expected Discharge Plan:  Skilled Nursing Facility  In-House Referral:  Clinical Social Work  Discharge planning Services  CM Consult  Post Acute Care Choice:    Choice offered to:     DME Arranged:    DME Agency:     HH Arranged:    [place] Agency:     Status of Service:  In process, will continue to follow  Medicare Important Message Given:    Date Medicare IM Given:    Medicare IM give by:    Date Additional Medicare IM Given:    Additional Medicare Important Message give by:     If discussed at [place] of Stay Meetings, dates discussed:    Additional Comments:  Reinaldo Raddle, RN, BSN  Trauma/Neuro ICU Case Manager [phone]

## 2015-09-24 NOTE — Progress Notes (Signed)
   09/24/15 1618  Clinical Encounter Type  Visited With Patient;Health care provider  Visit Type Initial;Spiritual support  Referral From Patient  Spiritual Encounters  Spiritual Needs Prayer   Chaplain responded to a patient requesting prayer. Chaplain met with the patient, facilitated a bit of a life review, and prayed with the patient. Chaplain support available as needed.   Jeri Lager, Chaplain 09/24/2015 4:20 PM

## 2015-09-24 NOTE — Progress Notes (Signed)
Called Dr. Kathie Rhodes with [facility] regarding patient's foley that he placed last week.  Dr Simone Curia nurse returned the call and said that he would like the foley to stay in place until he is out of the ICU and able to stand to void.  At that time, follow normal voiding post-foley protocols and call if needed. [place], [place] C 09/24/2015 1:09 PM

## 2015-09-24 NOTE — Progress Notes (Signed)
Patient ID: Tommy Weeks, male   DOB: [birth year], 74 y.o.   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  CT tomorrow. May transfer to floor.

## 2015-09-24 NOTE — Progress Notes (Signed)
Per Dr. Christella Noa, pt foley can be removed in am.  Will continue to monitor.   Fredrich Romans, RN

## 2015-09-24 NOTE — Progress Notes (Signed)
PCCM PROGRESS NOTE  ADMISSION DATE: 09/23/2015 REFERRING PROVIDER: ER  CC: Weakness  SUBJECTIVE: Denies headache.  Mild nausea with eating.  OBJECTIVE: BP 110/71 mmHg  Pulse 121  Temp(Src) 98.9 F (37.2 C) (Oral)  Resp 18  Ht 6' (1.829 m)  Wt 139 lb 8.8 oz (63.3 kg)  BMI 18.92 kg/m2  SpO2 96%  I/O last 3 completed shifts: In: 490.8 [I.V.:490.8] Out: 1000 [Urine:1000]  General: pleasant HEENT: Rt facial drop Cardiac: irregular Chest: no wheeze Abd: soft, nontender Ext: no edema Neuro: normal strength, follows commands Skin: no rashes   CMP Latest Ref Rng 09/24/2015 09/23/2015 08/10/2015  Glucose 65 - 99 mg/dL 98 105(H) -  BUN 6 - 20 mg/dL 8 14 23.4  Creatinine 0.61 - 1.24 mg/dL 0.81 0.81 1.0  Sodium 135 - 145 mmol/L 136 136 -  Potassium 3.5 - 5.1 mmol/L 3.6 3.1(L) -  Chloride 101 - 111 mmol/L 98(L) 95(L) -  CO2 22 - 32 mmol/L 31 36(H) -  Calcium 8.9 - 10.3 mg/dL 8.4(L) 9.2 -  Total Protein 6.5 - 8.1 g/dL - 5.3(L) -  Total Bilirubin 0.3 - 1.2 mg/dL - 1.0 -  Alkaline Phos 38 - 126 U/L - 83 -  AST 15 - 41 U/L - 22 -  ALT 17 - 63 U/L - 16(L) -     CBC Latest Ref Rng 09/24/2015 09/23/2015 07/06/2015  WBC 4.0 - 10.5 K/uL 4.0 4.5 4.3  Hemoglobin 13.0 - 17.0 g/dL 9.8(L) 10.4(L) 11.9(L)  Hematocrit 39.0 - 52.0 % 31.7(L) 32.7(L) 36.3(L)  Platelets 150 - 400 K/uL 272 305 264     Ct Head Wo Contrast  09/23/2015  CLINICAL DATA:  New onset of weakness. Patient is anticoagulated, for atrial fibrillation. Multiple recent falls. History of recurrent vestibular schwannoma. EXAM: CT HEAD WITHOUT CONTRAST TECHNIQUE: Contiguous axial images were obtained from the base of the skull through the vertex without intravenous contrast. COMPARISON:  MR brain 08/07/2015. FINDINGS: Since the previous exam, the patient has developed an asymmetric extra-axial collection over the RIGHT frontal convexity, 6-7 mm thickness, displaying low to intermediate attenuation (25-44 Hounsfield units.) This  was not present on recent MR, and is consistent with a subacute RIGHT subdural hematoma. No significant midline shift, but there is mild mass effect on the RIGHT frontal cortex. There is no definite LEFT-sided collection. Prominence of the extra-axial spaces on the LEFT is felt to represent atrophy. Generalized atrophy. Hypoattenuation of white matter representing chronic microvascular ischemic change. RIGHT suboccipital craniectomy and surgical encephalomalacia of the RIGHT lateral cerebellum. Soft tissue within the flared RIGHT IAC represents recurrent vestibular schwannoma. Cavernous carotid vascular calcification. Cataract extraction. No sinus air-fluid level. Negative mastoids. IMPRESSION: Interval development since October 2016 MR of a 6-7 mm subacute subdural hematoma on the RIGHT. No midline shift, but slight mass effect on the frontal cortex. Neurosurgical consultation may be warranted. Findings discussed with ordering provider. Electronically Signed   By: Staci Righter M.D.   On: 09/23/2015 16:59   Dg Chest Port 1 View  09/23/2015  CLINICAL DATA:  Weakness, possible CVA. EXAM: PORTABLE CHEST 1 VIEW COMPARISON:  02/14/2013 FINDINGS: Cardiomediastinal silhouette is normal. Mediastinal contours appear intact. Atherosclerotic disease of the aortic arch is noted. There is no evidence of focal airspace consolidation, pleural effusion or pneumothorax. There is a eventration of the right hemidiaphragm. Osseous structures are without acute abnormality. The bilateral humeral heads are high riding, suggestive of chronic rotator cuff injuries. Soft tissues are grossly normal. IMPRESSION: No radiographic evidence  of acute cardiopulmonary abnormality. Atherosclerotic disease of the aorta. Electronically Signed   By: Fidela Salisbury M.D.   On: 09/23/2015 17:00    STUDIES: 12/04 CT head >> 6 to 7 mm subacute Rt SDH  EVENTS: 12/04 Admit, neurosurgery consulted, coumadin reversed  DISCUSSION: 74 yo male  presented with weakness, vertigo and frequent falls found to have small SDH.  He has hx of a fib on coumadin, HTN, DM.  He also has Rt acoustic neuroma s/p resection resulting in facial droop, and recurrence in October 2016 s/p XRT.  ASSESSMENT/PLAN:  Small SDH. Plan: - f/u with neurosurgery  Generalized weakness with frequent falls. Plan: - consult PT  Hx of A fib >> followed by Dr. Einar Gip as outpt. Hx of HTN. Plan: - monitor off coumadin >> at least 4 to 6 weeks per neurosurgery - continue cardizem, metroprolol, amlodipine  Hypokalemia. Plan: - f/u and replace electrolytes as needed  BPH >> has been managed with outpt foley by urology. Plan: - continue flomax - RN to check with urology about whether follow can be removed  Anemia of chronic disease. Plan: - f/u CBC  Hx of DM. Plan: - SSI - hold outpt metformin for now  Hx of anxiety. Plan: - continue bupropion  Goals of Care >> Full code DVT prophylaxis >> SCD's SUP >> not indicated  Okay to transfer to telemetry if okay with neurosurgery.  If transfer out, will then transfer to [facility] for 12/06.  Updated pt's son at bedside.   Chesley Mires, MD [facility] Pulmonary/Critical Care 09/24/2015, 12:35 PM Pager:  [phone] After 3pm call: [phone]

## 2015-09-24 NOTE — Progress Notes (Addendum)
Initial Nutrition Assessment  DOCUMENTATION CODES:   Severe malnutrition in context of chronic illness  INTERVENTION:   Boost Breeze po TID, each supplement provides 250 kcal and 9 grams of protein  NUTRITION DIAGNOSIS:   Malnutrition related to chronic illness as evidenced by severe depletion of body fat, severe depletion of muscle mass, percent weight loss.  GOAL:   Patient will meet greater than or equal to 90% of their needs  MONITOR:   PO intake, Supplement acceptance, Labs, Weight trends  REASON FOR ASSESSMENT:   Malnutrition Screening Tool   ASSESSMENT:   74yo male with hx HTN, AFib on coumadin, DM, R acoustic neuroma s/p resection in 2002 with subsequent facial paralysis with recurrence found in October now having radiation treatment. Pt has had progressive weakness, dizziness and vertigo with frequent falls. Ultimately presented 12/4 to ER with worsening weakness BLE>BUE after a fall 3-4 days ago. Pt with new SDH.   Per pt he has lost 17% of his body weight in 3 months. He usually weighs 165 lb but began losing weight. He reports lack of appetite during this same time frame. He forces himself to eat.  Breakfast: juice, milk, and pastry Lunch: 1/2 to 1 sandwich or soup Dinner: take out mostly Pt drink equate supplement drink at home (1 per day). Discussed increasing kcal content at meals. Reviewed foods helpful for this. Encouraged intake of equate plus instead of regular equate and up amount to at least 2 per day. Pt would like to continue Boost Breeze for now.  Nutrition-Focused physical exam completed. Findings are severd fat depletion, severe muscle depletion, and no edema.    Diet Order:  Diet Carb Modified Fluid consistency:: Thin; Room service appropriate?: Yes  Skin:  Reviewed, no issues  Last BM:  12/5  Height:   Ht Readings from Last 1 Encounters:  09/23/15 6' (1.829 m)   Weight:   Wt Readings from Last 1 Encounters:  09/24/15 139 lb 8.8 oz  (63.3 kg)   Ideal Body Weight:  80.9 kg  BMI:  Body mass index is 18.92 kg/(m^2).  Estimated Nutritional Needs:   Kcal:  1800-2000  Protein:  85-100 grams  Fluid:  >1.8 L/day  EDUCATION NEEDS:   No education needs identified at this time  [place], [place], [place] Pager [phone] After Hours Pager

## 2015-09-24 NOTE — Progress Notes (Signed)
Pt is admitted to 5M18 from [place]. Pt is aoX4 and family is at the bedside

## 2015-09-25 ENCOUNTER — Inpatient Hospital Stay (HOSPITAL_COMMUNITY): Payer: Medicare Other

## 2015-09-25 DIAGNOSIS — S065X1S Traumatic subdural hemorrhage with loss of consciousness of 30 minutes or less, sequela: Secondary | ICD-10-CM

## 2015-09-25 LAB — GLUCOSE, CAPILLARY
GLUCOSE-CAPILLARY: 102 mg/dL — AB (ref 65–99)
GLUCOSE-CAPILLARY: 127 mg/dL — AB (ref 65–99)
Glucose-Capillary: 101 mg/dL — ABNORMAL HIGH (ref 65–99)
Glucose-Capillary: 88 mg/dL (ref 65–99)

## 2015-09-25 LAB — BASIC METABOLIC PANEL
Anion gap: 7 (ref 5–15)
BUN: 7 mg/dL (ref 6–20)
CHLORIDE: 99 mmol/L — AB (ref 101–111)
CO2: 31 mmol/L (ref 22–32)
Calcium: 8.5 mg/dL — ABNORMAL LOW (ref 8.9–10.3)
Creatinine, Ser: 0.75 mg/dL (ref 0.61–1.24)
Glucose, Bld: 105 mg/dL — ABNORMAL HIGH (ref 65–99)
Potassium: 3 mmol/L — ABNORMAL LOW (ref 3.5–5.1)
SODIUM: 137 mmol/L (ref 135–145)

## 2015-09-25 LAB — HEMOGLOBIN A1C
Hgb A1c MFr Bld: 5.5 % (ref 4.8–5.6)
Mean Plasma Glucose: 111 mg/dL

## 2015-09-25 LAB — CBC
HEMATOCRIT: 33.6 % — AB (ref 39.0–52.0)
HEMOGLOBIN: 10.4 g/dL — AB (ref 13.0–17.0)
MCH: 25.6 pg — ABNORMAL LOW (ref 26.0–34.0)
MCHC: 31 g/dL (ref 30.0–36.0)
MCV: 82.8 fL (ref 78.0–100.0)
Platelets: 315 10*3/uL (ref 150–400)
RBC: 4.06 MIL/uL — ABNORMAL LOW (ref 4.22–5.81)
RDW: 15.5 % (ref 11.5–15.5)
WBC: 4.4 10*3/uL (ref 4.0–10.5)

## 2015-09-25 LAB — PROTIME-INR
INR: 1.27 (ref 0.00–1.49)
PROTHROMBIN TIME: 16.1 s — AB (ref 11.6–15.2)

## 2015-09-25 LAB — MAGNESIUM: MAGNESIUM: 1.4 mg/dL — AB (ref 1.7–2.4)

## 2015-09-25 MED ORDER — MAGNESIUM SULFATE 2 GM/50ML IV SOLN
2.0000 g | Freq: Once | INTRAVENOUS | Status: AC
  Administered 2015-09-25: 2 g via INTRAVENOUS
  Filled 2015-09-25: qty 50

## 2015-09-25 MED ORDER — POTASSIUM CHLORIDE CRYS ER 20 MEQ PO TBCR
30.0000 meq | EXTENDED_RELEASE_TABLET | ORAL | Status: AC
  Administered 2015-09-25 (×2): 30 meq via ORAL
  Filled 2015-09-25 (×2): qty 1

## 2015-09-25 NOTE — Progress Notes (Signed)
PROGRESS NOTE    Tommy Weeks [id number] DOB: 08/13/1941 DOA: 09/23/2015 PCP: Jani Gravel, MD  HPI/Brief narrative 74 year old right-handed male with history of HTN, A. 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 He initially presented to [facility]. CT brain showed subacute right subdural hematoma without significant mass effect. Neurosurgery was consulted and recommended transferring to [facility] neurosurgical ICU under CCM care and no surgical intervention. INR on admission: 2.97.     Assessment/Plan:  Subdural hematoma - In the context of frequent falls, head injury and anticoagulation with Coumadin on admission - Initially admitted to neuro ICU under CCM care for close monitoring and management. After ensuring stability, patient was transferred to medical floor and [place] care on 09/25/15. - Neurosurgery continues to follow. No surgical intervention recommended. - Coagulopathy was reversed with vitamin K. INR 1.27. - As per initial consult note by Dr. Sherwood Gambler 09/23/15: He anticipates that it will take 1-2 months for patient's subacute subdural hematoma to clear and during this time he will not be able to receive anticoagulation or antiplatelet therapy for his atrial fibrillation. - Repeat CT head 12/6 shows stable right subdural hematoma with local mass effect, small left subdural hematoma and no midline shift. - PT and OT have evaluated and recommend inpatient rehabilitation.  Deconditioning/generalized weakness and frequent falls - Inpatient rehabilitation when medically stable.  A. fib with controlled ventricular rate - Continue  Cardizem, metoprolol - No anticoagulation for 1-2 months as per neurosurgery recommendations. - Follows with Dr. Einar Gip as outpatient - Ventricular rate mostly in the 100s-110s. May consider increasing metoprolol.  Essential hypertension - Controlled. Continue Cardizem, metoprolol and amlodipine.  Hypokalemia/hypomagnesemia - Replace and follow.  BPH - Was managed with outpatient Foley by urology. Foley catheter was discontinued 12/6. Patient states that he has voided since. - Outpatient follow-up with urology. - Continue Flomax.  Anemia - Stable  Diabetes mellitus, controlled - Metformin held. Continue SSI.  Anxiety - Continue bupropion.   Recurrent vestibular schwannoma - Outpatient follow-up.    DVT prophylaxis: SCDs Code Status: Full Family Communication: None at bedside Disposition Plan: DC to CIR when medically stable, possibly 12/7 pending neurosurgical clearance   Consultants:  CCM  Neurosurgery  Procedures:  None  Antibiotics:  None   Subjective: Denies complaints. No headache, dizziness or lightheadedness.  Objective: Filed Vitals:   09/25/15 0447 09/25/15 0500 09/25/15 0949 09/25/15 1510  BP:  116/64 110/76 110/66  Pulse:  107 113 106  Temp:  99.3 F (37.4 C) 98.8 F (37.1 C) 100.2 F (37.9 C)  TempSrc:  Oral Oral Oral  Resp:  18 20 20   Height:      Weight: 64.864 kg (143 lb)     SpO2:  96% 98% 99%    Intake/Output Summary (Last 24 hours) at 09/25/15 1514 Last data filed at 09/25/15 1032  Gross per 24 hour  Intake      0 ml  Output   2100 ml  Net  -2100 ml   Filed Weights   09/23/15 2100 09/24/15 0500 09/25/15 0447  Weight: 63.3 kg (139 lb 8.8 oz) 63.3 kg (139 lb 8.8 oz) 64.864 kg (143 lb)     Exam:  General exam: Pleasant elderly male, frail, lying comfortably in bed. Respiratory system: Clear. No increased work of breathing. Cardiovascular system: S1 & S2 heard, irregularly irregular. No JVD, murmurs, gallops, clicks or  pedal edema. Telemetry: A. fib with ventricular rate in the [phone]'s Gastrointestinal system: Abdomen is nondistended, soft and nontender. Normal bowel sounds heard. Central nervous system: Alert and oriented. No focal neurological deficits. Extremities: Symmetric 5 x 5 power in right limbs and? 4+ by 5 power in left limbs..   Data Reviewed: Basic Metabolic Panel:  Recent Labs Lab 09/23/15 1545 09/24/15 0405 09/25/15 0527  NA 136 136 137  K 3.1* 3.6 3.0*  CL 95* 98* 99*  CO2 36* 31 31  GLUCOSE 105* 98 105*  BUN 14 8 7   CREATININE 0.81 0.81 0.75  CALCIUM 9.2 8.4* 8.5*  MG  --   --  1.4*   Liver Function Tests:  Recent Labs Lab 09/23/15 1545  AST 22  ALT 16*  ALKPHOS 83  BILITOT 1.0  PROT 5.3*  ALBUMIN 2.4*   No results for input(s): LIPASE, AMYLASE in the last 168 hours. No results for input(s): AMMONIA in the last 168 hours. CBC:  Recent Labs Lab 09/23/15 1545 09/24/15 0405 09/25/15 0527  WBC 4.5 4.0 4.4  HGB 10.4* 9.8* 10.4*  HCT 32.7* 31.7* 33.6*  MCV 81.3 83.4 82.8  PLT 305 272 315   Cardiac Enzymes: No results for input(s): CKTOTAL, CKMB, CKMBINDEX, TROPONINI in the last 168 hours. BNP (last 3 results) No results for input(s): PROBNP in the last 8760 hours. CBG:  Recent Labs Lab 09/24/15 1133 09/24/15 1544 09/24/15 2027 09/25/15 0631 09/25/15 1120  GLUCAP 114* 135* 133* 101* 102*    Recent Results (from the past 240 hour(s))  MRSA PCR Screening     Status: None   Collection Time: 09/23/15  8:29 PM  Result Value Ref Range Status   MRSA by PCR NEGATIVE NEGATIVE Final    Comment:        The GeneXpert MRSA Assay (FDA approved for NASAL specimens only), is one component of a comprehensive MRSA colonization surveillance program. It is not intended to diagnose MRSA infection nor to guide or monitor treatment for MRSA infections.          Studies: Ct Head Wo Contrast  09/25/2015  CLINICAL DATA:  74 year old diabetic hypertensive  male with atrial fibrillation on anticoagulation with subdural collection. Prior surgery for vestibular schwannoma. Recurrence. Subsequent encounter. EXAM: CT HEAD WITHOUT CONTRAST TECHNIQUE: Contiguous axial images were obtained from the base of the skull through the vertex without intravenous contrast. COMPARISON:  09/23/2015 and 06/28/2007 head CT. 08/07/2015 brain MR. FINDINGS: Right sided subdural broad-based collection with maximal thickness of 7.7 mm without significant change. Local mass effect without midline shift. Left-sided extra-axial collection may represent prominent subarachnoid spaces but appears slightly different than recent MR and remote CT and may represent isodense left subdural collection with maximal thickness of 4.4 mm. Remote right occipital craniotomy for resection of vestibular schwannoma with recurrence as noted on recent MR. Encephalomalacia along the operative tract unchanged. No CT evidence of large acute infarct. Baseline atrophy. Vascular calcifications. Remote right nasal bone surgery with screw in place. IMPRESSION: Overall similar appearance to the recent CT with broad-based right-sided and possibly smaller left-sided subdural hematoma with local mass effect without midline shift. Postoperative changes right posterior fossa with recurrent right-sided vestibular schwannoma better delineated on recent MR. Electronically Signed   By: Genia Del M.D.   On: 09/25/2015  07:35   Ct Head Wo Contrast  09/23/2015  CLINICAL DATA:  New onset of weakness. Patient is anticoagulated, for atrial fibrillation. Multiple recent falls. History of recurrent vestibular schwannoma. EXAM: CT HEAD WITHOUT CONTRAST TECHNIQUE: Contiguous axial images were obtained from the base of the skull through the vertex without intravenous contrast. COMPARISON:  MR brain 08/07/2015. FINDINGS: Since the previous exam, the patient has developed an asymmetric extra-axial collection over the RIGHT frontal convexity,  6-7 mm thickness, displaying low to intermediate attenuation (25-44 Hounsfield units.) This was not present on recent MR, and is consistent with a subacute RIGHT subdural hematoma. No significant midline shift, but there is mild mass effect on the RIGHT frontal cortex. There is no definite LEFT-sided collection. Prominence of the extra-axial spaces on the LEFT is felt to represent atrophy. Generalized atrophy. Hypoattenuation of white matter representing chronic microvascular ischemic change. RIGHT suboccipital craniectomy and surgical encephalomalacia of the RIGHT lateral cerebellum. Soft tissue within the flared RIGHT IAC represents recurrent vestibular schwannoma. Cavernous carotid vascular calcification. Cataract extraction. No sinus air-fluid level. Negative mastoids. IMPRESSION: Interval development since October 2016 MR of a 6-7 mm subacute subdural hematoma on the RIGHT. No midline shift, but slight mass effect on the frontal cortex. Neurosurgical consultation may be warranted. Findings discussed with ordering provider. Electronically Signed   By: Staci Righter M.D.   On: 09/23/2015 16:59   Dg Chest Port 1 View  09/23/2015  CLINICAL DATA:  Weakness, possible CVA. EXAM: PORTABLE CHEST 1 VIEW COMPARISON:  02/14/2013 FINDINGS: Cardiomediastinal silhouette is normal. Mediastinal contours appear intact. Atherosclerotic disease of the aortic arch is noted. There is no evidence of focal airspace consolidation, pleural effusion or pneumothorax. There is a eventration of the right hemidiaphragm. Osseous structures are without acute abnormality. The bilateral humeral heads are high riding, suggestive of chronic rotator cuff injuries. Soft tissues are grossly normal. IMPRESSION: No radiographic evidence of acute cardiopulmonary abnormality. Atherosclerotic disease of the aorta. Electronically Signed   By: Fidela Salisbury M.D.   On: 09/23/2015 17:00        Scheduled Meds: . amLODipine  5 mg Oral q morning  - 10a  . buPROPion  100 mg Oral BID  . diltiazem  240 mg Oral q morning - 10a  . feeding supplement  1 Container Oral TID BM  . insulin aspart  0-15 Units Subcutaneous TID WC  . insulin aspart  0-5 Units Subcutaneous QHS  . metoprolol succinate  25 mg Oral Q breakfast  . tamsulosin  0.4 mg Oral Daily   Continuous Infusions:   Active Problems:   Warfarin-induced coagulopathy (HCC)   SDH (subdural hematoma) (HCC)   Weakness    Time spent: 35 minutes.    Vernell Leep, MD, FACP, FHM. [facility] Pager [phone]  If 7PM-7AM, please contact night-coverage [email or web address] Password [email or web address] 09/25/2015, 3:14 PM    LOS: 2 days

## 2015-09-25 NOTE — Progress Notes (Signed)
Rehab Admissions Coordinator Note:  Patient was screened by Retta Diones for appropriateness for an Inpatient Acute Rehab Consult.  At this time, we are recommending Inpatient Rehab consult.  Jodell Cipro M 09/25/2015, 11:56 AM  I can be reached at [phone].

## 2015-09-25 NOTE — H&P (Signed)
Physical Medicine and Rehabilitation Admission H&P    Chief Complaint  Patient presents with  . Weakness  : HPI: Tommy Weeks is a 73 y.o. 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 Patient lives with spouse who has reported dementia require 24-hour care. He has a Personal care attendant to help his wife. Patient uses a single-point cane prior to admission. 2 level home 3 steps to entry with bedroom first floor. Presented 09/23/2015 with weakness lower extremities and to a lesser extent upper extremities. He does note 3 separate falls over the past 2 weeks hitting his head against a door jam in the bathroom. INR on admission of 2.97. CT of the brain showed a right frontal isodense subacute subdural hematoma without significant mass effect. Patient's Coumadin was reversed. Follow-up neurosurgery Dr. Sherwood Gambler advise conservative care. Latest follow-up cranial CT scan 09/25/2015 with overall appearance smaller left-sided subdural hematoma without midline shift. Tolerating a regular consistency diet. Bouts of hypokalemia 3.0 with potassium supplement added as well as magnesium 1.6. Physical therapy evaluation completed 09/25/2015 with recommendations of physical medicine rehabilitation consult.Patient was admitted for a comprehensive rehab program  ROS Constitutional: Negative for fever and chills.  HENT: Positive for hearing loss.   Headache  Eyes: Negative for blurred vision and double vision.  Respiratory: Negative for cough and shortness of breath.  Cardiovascular: Positive for palpitations. Negative for chest pain and leg swelling.  Gastrointestinal: Positive for blood in stool. Negative for nausea and vomiting.  Genitourinary: Negative for dysuria and hematuria.   Urinary retention    Musculoskeletal: Positive for myalgias and falls.  Skin: Negative for rash.  Neurological: Positive for weakness. Negative for seizures.   Right facial weakness  Psychiatric/Behavioral: Positive for depression.   Anxiety  All other systems reviewed and are negative  Past Medical History  Diagnosis Date  . Hypertension   . 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 Anxiety   . Depression   . 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 Prostate cancer ([place])   . History of urinary retention   . History of prostatitis   . Chronic atrial fibrillation ([place])   . Dysrhythmia   . Type 2 diabetes mellitus ([facility])     NIDDM  . Complication of anesthesia     urinary retention  . Hyperlipidemia   . Neuromuscular disorder ([place])     imbalance s/p craniectomy  . Prostate cancer [facility])    Past Surgical History  Procedure Laterality Date  . Cholecystectomy  90's  . Retroperitoneal mass excision  03/31/2013  . Laparotomy N/A 03/31/2013    Procedure: Open resection of abdominal mass;  Surgeon: Stark Klein, MD;  Location: [place];  Service: General;  Laterality: N/A;  . Cardiac catheterization  1990  . Inguinal hernia repair Bilateral 05/24/2013    Procedure: bilateral open HERNIA REPAIR INGUINAL ADULT;  Surgeon: Gayland Curry, MD;  Location: [facility];  Service: General;  Laterality: Bilateral;  . Insertion of mesh Bilateral 05/24/2013    Procedure: INSERTION OF MESH;  Surgeon: Gayland Curry, MD;  Location: [facility];  Service: General;  Laterality: Bilateral;  . Umbilical hernia  repair  [id number]  . Craniectomy for excision of acoustic neuroma Right 02-12-2001  . Hydrocele excision Right 11-26-2007  . Exploration face right side  11-29-2001  &  02-06-2003    post craniotomy resection acoustic neuroma--  facial paralysis  .  Transthoracic echocardiogram  05-11-2007  (per PCP note)    ef 55-65%,  mild TR and MR  . Eye surgery Bilateral     cataract OU w/ IOL  . Transrectal ultrasound N/A 06/11/2015    Procedure: TRANSRECTAL ULTRASOUND AND BIOPSY;  Surgeon: Kathie Rhodes, MD;  Location: [facility];  Service: Urology;  Laterality: N/A;   History reviewed. No pertinent family history. Social History:  reports that he has never smoked. He has never used smokeless tobacco. He reports that he does not drink alcohol or use illicit drugs. Allergies: No Known Allergies Medications Prior to Admission  Medication Sig Dispense Refill  . acetaminophen (TYLENOL) 500 MG tablet Take 500 mg by mouth every 8 (eight) hours as needed for mild pain.    Marland Kitchen amLODipine (NORVASC) 5 MG tablet Take 5 mg by mouth every morning.     Marland Kitchen buPROPion (WELLBUTRIN) 100 MG tablet Take 100 mg by mouth 2 (two) times daily.     Marland Kitchen diltiazem (CARDIZEM CD) 240 MG 24 hr capsule Take 240 mg by mouth every morning.     . feeding supplement, ENSURE ENLIVE, (ENSURE ENLIVE) LIQD Take 237 mLs by mouth 2 (two) times daily between meals. (Patient taking differently: Take 237 mLs by mouth daily. ) 237 mL 12  . fenofibrate (TRICOR) 145 MG tablet Take 145 mg by mouth at bedtime.     . fish oil-omega-3 fatty acids 1000 MG capsule Take 1 g by mouth 2 (two) times daily.     Marland Kitchen LORazepam (ATIVAN) 0.5 MG tablet Take 0.5 mg by mouth 2 (two) times daily.    . metFORMIN (GLUCOPHAGE) 500 MG tablet Take 1,000 mg by mouth daily before supper.    . metoprolol succinate (TOPROL-XL) 100 MG 24 hr tablet Take 25 mg by mouth daily. Take with or immediately following a meal.    . tamsulosin (FLOMAX) 0.4 MG CAPS capsule Take 1 capsule (0.4 mg total) by mouth daily. (Patient taking differently: Take 0.8 mg by mouth at bedtime. ) 7 capsule 0  . warfarin (COUMADIN) 5 MG tablet Take 5-7.5 mg by mouth daily. Alternate daily between 15m to 7.511m    . rosuvastatin (CRESTOR) 5 MG  tablet Take 5 mg by mouth every Monday. Monday      Home: Home Living Family/patient expects to be discharged to:: Private residence Living Arrangements: Spouse/significant other (spouse has dementia; requires 24/7 due to cognition) Available Help at Discharge: Personal care attendant, Other (Comment) (paying friend to help ) Type of Home: House Home Access: Stairs to enter [employer] Steps: 3 Entrance Stairs-Rails: None Home Layout: Able to live on main level with bedroom/bathroom Bathroom Shower/Tub: WaHoliday representativeccessibility: Yes Home Equipment: Wa[employer]al consultant 2 wheels, [place] single point, Shower seat, Bedside commode   Functional History: Prior Function Level of Independence: Independent with assistive device(s) Comments: recent falls began due to imbalance after Oct 16 acoustic neuroma surgery; began using RW and has not fallen when using RW  Functional Status:  Mobility: Bed Mobility Overal bed mobility: Modified Independent General bed mobility comments: pt requires incr time in sitting to allow imbalance sensation to subside Transfers Overall transfer level: Needs assistance Equipment used: Rolling walker (2 wheeled) Transfers:  Sit to/from Stand Sit to Stand: Min assist General transfer comment: x3; unsafe use of RW with tipping; uncontrolled descents Ambulation/Gait Ambulation/Gait assistance: Min assist Ambulation Distance (Feet): 70 Feet Assistive device: Rolling walker (2 wheeled) Gait Pattern/deviations: Step-through pattern, Decreased stride length, Ataxic General Gait Details: limited by fatigue of legs Gait velocity: encouraged to decr especially with turns or head turns to minimize dizziness/imbalance Gait velocity interpretation: Below normal speed for age/gender    ADL: ADL Overall ADL's : Needs assistance/impaired Eating/Feeding: Set up, Sitting Grooming: Minimal assistance, Wash/dry hands, Standing Grooming Details (indicate  cue type and reason): fatigues easily, min A for balance Upper Body Bathing: Sitting, Minimal assitance Lower Body Bathing: Moderate assistance, Sit to/from stand Upper Body Dressing : Minimal assistance, Sitting Lower Body Dressing: Moderate assistance, Sit to/from stand Toilet Transfer: Moderate assistance, Ambulation, RW, Grab bars (3n1 over toilet) Toileting- Clothing Manipulation and Hygiene: Moderate assistance, Sit to/from stand Tub/ Shower Transfer: Moderate assistance, Ambulation, 3 in 1, Rolling walker Functional mobility during ADLs: Moderate assistance, Rolling walker General ADL Comments: Pt completed in-room functional mobility, toilet transfer, and grooming task as detailed below. Pt fatigues easily. Pt initially completing practice of ambulating to bathroom for toilet transfer. Upon returning to EOB pt reporting urgent need to toilet. Pt with decreased safety awareness, increased speed and decreased balance to return to toilet. Pt with LOB in bathroom requiring mod A to prevent a fall. Pt cued to slow down and focus on his balance. Pt with some urinary incontinence.   Cognition: Cognition Overall Cognitive Status: Within Functional Limits for tasks assessed Orientation Level: Oriented X4 Cognition Arousal/Alertness: Awake/alert Behavior During Therapy: WFL for tasks assessed/performed Overall Cognitive Status: Within Functional Limits for tasks assessed Memory: Decreased recall of precautions  Physical Exam: Blood pressure 134/88, pulse 120, temperature 99.7 F (37.6 C), temperature source Oral, resp. rate 17, height 6' (1.829 m), weight 63.594 kg (140 lb 3.2 oz), SpO2 99 %. Physical Exam Constitutional: He is oriented to person, place, and time.  HENT:  Normocephalic.  Atraumatic Eyes: EOM and Conj are normal.  Neck: Normal range of motion. Neck supple. No thyromegaly present.  Cardiovascular: Irregular irregular  Respiratory: Effort normal and breath sounds normal. No  respiratory distress.  GI: Soft. Bowel sounds are normal. He exhibits no distension.  Neurological: He is alert and oriented to person, place, and time. He exhibits normal muscle tone.  Follows full commands.  Good insight and awareness.  Right facial weakness. No hearing on right.  B/l UE: 4/5 proximal to distal B/l LE: 3/5 Hip flexion, 4/5 knee extension, ankle dorsi/plantar flexion Decreased sensation to light touch distal LE  3+ DTR RLE Skin: Skin is warm and dry.  Psychiatric: He has a normal mood and affect. His behavior is normal  Results for orders placed or performed during the hospital encounter of 09/23/15 (from the past 48 hour(s))  Glucose, capillary     Status: Abnormal   Collection Time: 09/24/15  3:44 PM  Result Value Ref Range   Glucose-Capillary 135 (H) 65 - 99 mg/dL  Glucose, capillary     Status: Abnormal   Collection Time: 09/24/15  8:27 PM  Result Value Ref Range   Glucose-Capillary 133 (H) 65 - 99 mg/dL   Comment 1 Notify RN    Comment 2 Document in Chart   Protime-INR     Status: Abnormal   Collection Time: 09/25/15  5:27 AM  Result Value Ref Range   Prothrombin Time 16.1 (H) 11.6 - 15.2  seconds   INR 1.27 0.00 - 0.99  Basic metabolic panel     Status: Abnormal   Collection Time: 09/25/15  5:27 AM  Result Value Ref Range   Sodium 137 135 - 145 mmol/L   Potassium 3.0 (L) 3.5 - 5.1 mmol/L   Chloride 99 (L) 101 - 111 mmol/L   CO2 31 22 - 32 mmol/L   Glucose, Bld 105 (H) 65 - 99 mg/dL   BUN 7 6 - 20 mg/dL   Creatinine, Ser 0.75 0.61 - 1.24 mg/dL   Calcium 8.5 (L) 8.9 - 10.3 mg/dL   GFR calc non Af Amer >60 >60 mL/min   GFR calc Af Amer >60 >60 mL/min    Comment: (NOTE) The eGFR has been calculated using the CKD EPI equation. This calculation has not been validated in all clinical situations. eGFR's persistently <60 mL/min signify possible Chronic Kidney Disease.    Anion gap 7 5 - 15  CBC     Status: Abnormal   Collection Time: 09/25/15  5:27 AM    Result Value Ref Range   WBC 4.4 4.0 - 10.5 K/uL   RBC 4.06 (L) 4.22 - 5.81 MIL/uL   Hemoglobin 10.4 (L) 13.0 - 17.0 g/dL   HCT 33.6 (L) 39.0 - 52.0 %   MCV 82.8 78.0 - 100.0 fL   MCH 25.6 (L) 26.0 - 34.0 pg   MCHC 31.0 30.0 - 36.0 g/dL   RDW 15.5 11.5 - 15.5 %   Platelets 315 150 - 400 K/uL  Magnesium     Status: Abnormal   Collection Time: 09/25/15  5:27 AM  Result Value Ref Range   Magnesium 1.4 (L) 1.7 - 2.4 mg/dL  Glucose, capillary     Status: Abnormal   Collection Time: 09/25/15  6:31 AM  Result Value Ref Range   Glucose-Capillary 101 (H) 65 - 99 mg/dL   Comment 1 Notify RN    Comment 2 Document in Chart   Glucose, capillary     Status: Abnormal   Collection Time: 09/25/15 11:20 AM  Result Value Ref Range   Glucose-Capillary 102 (H) 65 - 99 mg/dL   Comment 1 Notify RN    Comment 2 Document in Chart   Glucose, capillary     Status: Abnormal   Collection Time: 09/25/15  4:22 PM  Result Value Ref Range   Glucose-Capillary 127 (H) 65 - 99 mg/dL   Comment 1 Notify RN    Comment 2 Document in Chart   Glucose, capillary     Status: None   Collection Time: 09/25/15  9:46 PM  Result Value Ref Range   Glucose-Capillary 88 65 - 99 mg/dL   Comment 1 Notify RN    Comment 2 Document in Chart   Basic metabolic panel     Status: Abnormal   Collection Time: 09/26/15  3:27 AM  Result Value Ref Range   Sodium 136 135 - 145 mmol/L   Potassium 3.4 (L) 3.5 - 5.1 mmol/L   Chloride 100 (L) 101 - 111 mmol/L   CO2 29 22 - 32 mmol/L   Glucose, Bld 103 (H) 65 - 99 mg/dL   BUN 8 6 - 20 mg/dL   Creatinine, Ser 0.77 0.61 - 1.24 mg/dL   Calcium 8.4 (L) 8.9 - 10.3 mg/dL   GFR calc non Af Amer >60 >60 mL/min   GFR calc Af Amer >60 >60 mL/min    Comment: (NOTE) The eGFR has been calculated using the CKD EPI  equation. This calculation has not been validated in all clinical situations. eGFR's persistently <60 mL/min signify possible Chronic Kidney Disease.    Anion gap 7 5 - 15   Magnesium     Status: Abnormal   Collection Time: 09/26/15  3:27 AM  Result Value Ref Range   Magnesium 1.6 (L) 1.7 - 2.4 mg/dL  Glucose, capillary     Status: None   Collection Time: 09/26/15  6:32 AM  Result Value Ref Range   Glucose-Capillary 86 65 - 99 mg/dL   Comment 1 Notify RN    Comment 2 Document in Chart   Glucose, capillary     Status: Abnormal   Collection Time: 09/26/15 11:46 AM  Result Value Ref Range   Glucose-Capillary 101 (H) 65 - 99 mg/dL   Comment 1 Notify RN    Comment 2 Document in Chart    Ct Head Wo Contrast  09/25/2015  CLINICAL DATA:  74 year old diabetic hypertensive male with atrial fibrillation on anticoagulation with subdural collection. Prior surgery for vestibular schwannoma. Recurrence. Subsequent encounter. EXAM: CT HEAD WITHOUT CONTRAST TECHNIQUE: Contiguous axial images were obtained from the base of the skull through the vertex without intravenous contrast. COMPARISON:  09/23/2015 and 06/28/2007 head CT. 08/07/2015 brain MR. FINDINGS: Right sided subdural broad-based collection with maximal thickness of 7.7 mm without significant change. Local mass effect without midline shift. Left-sided extra-axial collection may represent prominent subarachnoid spaces but appears slightly different than recent MR and remote CT and may represent isodense left subdural collection with maximal thickness of 4.4 mm. Remote right occipital craniotomy for resection of vestibular schwannoma with recurrence as noted on recent MR. Encephalomalacia along the operative tract unchanged. No CT evidence of large acute infarct. Baseline atrophy. Vascular calcifications. Remote right nasal bone surgery with screw in place. IMPRESSION: Overall similar appearance to the recent CT with broad-based right-sided and possibly smaller left-sided subdural hematoma with local mass effect without midline shift. Postoperative changes right posterior fossa with recurrent right-sided vestibular schwannoma  better delineated on recent MR. Electronically Signed   By: Genia Del M.D.   On: 09/25/2015 07:35       Medical Problem List and Plan: 1.  Weakness secondary to SDH after fall,Hx right acoustic neuroma 2.  DVT Prophylaxis/Anticoagulation: SCD 3. Pain Management: Tylenol as needed 4. Mood: Ativan 0.5 mg every 6 hours as needed, Wellbutrin 100 mg twice a day, 5. Neuropsych: This patient is capable of making decisions on his own behalf. 6. Skin/Wound Care: Routine skin checks 7. Fluids/Electrolytes/Nutrition: Routine I&O with follow-up chemistries 8. History of atrial fibrillation. Chronic Coumadin discontinued secondary to subdural hematoma. Continue Cardizem 240 mg daily. Cardiac rate control 9. Hypertension. Toprol 25 mg daily, Norvasc 5 mg daily. 10. Diabetes mellitus with peripheral neuropathy. Hemoglobin A1c 5.5. Continue sliding scale. Check blood sugars before meals and at bedtime 11. Hypokalemia. Follow-up chemistries 12. BPH. Flomax 0.4 mg daily. Check PVRs 3  Post Admission Physician Evaluation: 1. Functional deficits secondary  to SDH after fall,Hx right acoustic neuroma. 2. Patient is admitted to receive collaborative, interdisciplinary care between the physiatrist, rehab nursing staff, and therapy team. 3. Patient's level of medical complexity and substantial therapy needs in context of that medical necessity cannot be provided at a lesser intensity of care such as a SNF. 4. Patient has experienced substantial functional loss from his/her baseline which was documented above under the "Functional History" and "Functional Status" headings.  Judging by the patient's diagnosis, physical exam, and functional history, the patient  has potential for functional progress which will result in measurable gains while on inpatient rehab.  These gains will be of substantial and practical use upon discharge  in facilitating mobility and self-care at the household level. 5. Physiatrist will  provide 24 hour management of medical needs as well as oversight of the therapy plan/treatment and provide guidance as appropriate regarding the interaction of the two. 6. 24 hour rehab nursing will assist with bladder management, bowel management, safety, skin/wound care, disease management, medication administration, pain management and patient education and help integrate therapy concepts, techniques,education, etc. 7. PT will assess and treat for/with: Lower extremity strength, range of motion, stamina, balance, functional mobility, safety, adaptive techniques and equipment, woundcare, coping skills, pain control, stroke education.   Goals are: Mod I. 8. OT will assess and treat for/with: ADL's, functional mobility, safety, upper extremity strength, adaptive techniques and equipment, wound mgt, ego support, and community reintegration.   Goals are: Mod I. Therapy may proceed with showering this patient. 9. Case Management and Social Worker will assess and treat for psychological issues and discharge planning. 10. Team conference will be held weekly to assess progress toward goals and to determine barriers to discharge. 11. Patient will receive at least 3 hours of therapy per day at least 5 days per week. 12. ELOS: 7-10 days.        13. Prognosis:  good  Delice Lesch, MD  09/26/2015

## 2015-09-25 NOTE — Progress Notes (Signed)
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 Pt reports no improvement of symptoms post-procedure PMHx- afib on coumadin, acoustic neuroma surgery '02 with Rt facial nerve palsy (facial droop), DM, prostate Ca   Clinical Impression   Pt admitted with the above diagnoses and presents with below problem list. Pt will benefit from continued acute OT to address the below listed deficits and maximize independence with BADLs prior to d/c to venue below. PTA pt was mod I with ADLs. Pt is currently min to mod A with ADLs and functional mobility. Pt with 1 LOB in bathroom requiring mod A to prevent a fall. Session details below. Feel pt will benefit from continued therapy in CIR setting prior to d/c home. OT to continue to follow acutely.      Follow Up Recommendations  CIR    Equipment Recommendations  Other (comment) (defer to next venue)    Recommendations for Other Services       Precautions / Restrictions Precautions Precautions: Fall Restrictions Weight Bearing Restrictions: No      Mobility Bed Mobility Overal bed mobility: Modified Independent             General bed mobility comments: with rail; pt requires incr time in sitting to allow imbalance sensation to subside  Transfers Overall transfer level: Needs assistance Equipment used: Rolling walker (2 wheeled) Transfers: Sit to/from Stand Sit to Stand: Min assist         General transfer comment: from EOB and 3n1. Cues for position of rw and hand placement. Uncontrolled descent onto EOB at end of session needing assist to control.    Balance Overall balance assessment: Needs assistance;History of Falls Sitting-balance support: No upper extremity supported;Feet  supported Sitting balance-Leahy Scale: Fair     Standing balance support: Bilateral upper extremity supported;During functional activity Standing balance-Leahy Scale: Poor Standing balance comment: min A for steading                            ADL Overall ADL's : Needs assistance/impaired Eating/Feeding: Set up;Sitting   Grooming: Minimal assistance;Wash/dry hands;Standing Grooming Details (indicate cue type and reason): fatigues easily, min A for balance Upper Body Bathing: Sitting;Minimal assitance   Lower Body Bathing: Moderate assistance;Sit to/from stand   Upper Body Dressing : Minimal assistance;Sitting   Lower Body Dressing: Moderate assistance;Sit to/from stand   Toilet Transfer: Moderate assistance;Ambulation;RW;Grab bars (3n1 over toilet)   Toileting- Clothing Manipulation and Hygiene: Moderate assistance;Sit to/from stand   Tub/ Banker: Moderate assistance;Ambulation;3 in 1;Rolling walker   Functional mobility during ADLs: Moderate assistance;Rolling walker General ADL Comments: Pt completed in-room functional mobility, toilet transfer, and grooming task as detailed below. Pt fatigues easily. Pt initially completing practice of ambulating to bathroom for toilet transfer. Upon returning to EOB pt reporting urgent need to toilet. Pt with decreased safety awareness, increased speed and decreased balance to return to toilet. Pt with 1 LOB in bathroom requiring mod A to prevent a fall. Pt cued to slow down and focus on his balance. Pt with some urinary incontinence.      Vision     Perception     Praxis      Pertinent Vitals/Pain Pain Assessment: No/denies pain     Hand  Dominance Right   Extremity/Trunk Assessment Upper Extremity Assessment Upper Extremity Assessment: Overall WFL for tasks assessed;Generalized weakness;LUE deficits/detail LUE Deficits / Details: tremulous with gross LUE MMT   Lower Extremity Assessment Lower Extremity  Assessment: Defer to PT evaluation    Cervical / Trunk Assessment Cervical / Trunk Assessment: Normal   Communication Communication Communication: Expressive difficulties;Other (comment) (due to facial nerve palsy)   Cognition Arousal/Alertness: Awake/alert Behavior During Therapy: WFL for tasks assessed/performed Overall Cognitive Status: No family/caregiver present to determine baseline cognitive functioning       Memory: Decreased recall of precautions             General Comments       Exercises       Shoulder Instructions      Home Living Family/patient expects to be discharged to:: Private residence Living Arrangements: Spouse/significant other (spouse has dementia; requires 24/7 due to cognition) Available Help at Discharge: Personal care attendant;Other (Comment) (paying friend to help ) Type of Home: House Home Access: Stairs to enter [employer] of Steps: 3 Entrance Stairs-Rails: None Home Layout: Able to live on main level with bedroom/bathroom     Bathroom Shower/Tub: Walk-in shower     Bathroom Accessibility: Yes   Home Equipment: Environmental consultant - 2 wheels;Cane - single point;Shower seat;Bedside commode          Prior Functioning/Environment Level of Independence: Independent with assistive device(s)        Comments: recent falls began due to imbalance after Oct 16 acoustic neuroma surgery; began using RW and has not fallen when using RW    OT Diagnosis: Generalized weakness;Cognitive deficits   OT Problem List: Decreased strength;Decreased activity tolerance;Impaired balance (sitting and/or standing);Decreased cognition;Decreased safety awareness;Decreased knowledge of use of DME or AE;Decreased knowledge of precautions   OT Treatment/Interventions: Self-care/ADL training;Therapeutic exercise;DME and/or AE instruction;Energy conservation;Therapeutic activities;Cognitive remediation/compensation;Patient/family education;Balance training     OT Goals(Current goals can be found in the care plan section) Acute Rehab OT Goals Patient Stated Goal: to be able to care for his wife in their home OT Goal Formulation: With patient Time For Goal Achievement: 10/02/15 Potential to Achieve Goals: Good ADL Goals Pt Will Perform Grooming: with modified independence;standing Pt Will Perform Upper Body Bathing: with modified independence;sitting Pt Will Perform Lower Body Bathing: with modified independence;sit to/from stand;with adaptive equipment Pt Will Perform Upper Body Dressing: sitting;with modified independence Pt Will Perform Lower Body Dressing: with modified independence;with adaptive equipment;sit to/from stand Pt Will Transfer to Toilet: with modified independence;ambulating (3n1 over toilet) Pt Will Perform Tub/Shower Transfer: with modified independence;ambulating;3 in 1;rolling walker  OT Frequency: Min 3X/week   Barriers to D/C:            Co-evaluation              End of Session Equipment Utilized During Treatment: Gait belt;Rolling walker Nurse Communication: Mobility status  Activity Tolerance: Patient tolerated treatment well Patient left: in bed;with call bell/phone within reach;with bed alarm set;Other (comment) (with MD present)   Time: 1400-1430 OT Time Calculation (min): 30 min Charges:  OT General Charges $OT Visit: 1 Procedure OT Evaluation $Initial OT Evaluation Tier I: 1 Procedure OT Treatments $Self Care/Home Management : 8-22 mins G-Codes:    Hortencia Pilar 09-29-2015, 2:52 PM

## 2015-09-25 NOTE — Progress Notes (Signed)
Patient ID: Tommy Weeks, male   DOB: 1941/06/07, 74 y.o.   MRN: [id number] BP 129/77 mmHg  Pulse 103  Temp(Src) 99.1 F (37.3 C) (Oral)  Resp 20  Ht 6' (1.829 m)  Wt 64.864 kg (143 lb)  BMI 19.39 kg/m2  SpO2 99% Alert and oriented x 4 Speech is clear and fluent Repeat ct shows no change Await rehab bed, medically stable for discharge

## 2015-09-25 NOTE — Consult Note (Signed)
Physical Medicine and Rehabilitation Consult Reason for Consult: Traumatic Right frontal subacute subdural hematoma Referring Physician: [facility]   HPI: Tommy Weeks is a 74 y.o. 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 Patient lives with spouse who has reported dementia require 24-hour care. He has a  Personal care attendant to help his wife. Patient uses a single-point cane prior to admission. 2 level home 3 steps to entry with bedroom first floor. Presented 09/23/2015 with weakness lower extremities and to a lesser extent upper extremities. He does note 3 separate falls over the past 2 weeks hitting his head against a door jam in the bathroom. INR on admission of 2.97. CT of the brain showed a right frontal isodense subacute subdural hematoma without significant mass effect. Patient's Coumadin was reversed. Follow-up neurosurgery Dr. Sherwood Gambler advise conservative care. Latest follow-up cranial CT scan 09/25/2015 with overall appearance smaller left-sided subdural hematoma without midline shift. Tolerating a regular consistency diet. Physical therapy evaluation completed 09/25/2015 with recommendations of physical medicine rehabilitation consult.   Review of Systems  Constitutional: Negative for fever and chills.  HENT: Positive for hearing loss.        Occasional headache  Eyes: Negative for blurred vision and double vision.  Respiratory: Negative for cough and shortness of breath.   Cardiovascular: Positive for palpitations. Negative for chest pain and leg swelling.  Gastrointestinal: Positive for blood in stool. Negative for nausea and vomiting.  Genitourinary: Negative for dysuria and hematuria.       Urinary retention  Musculoskeletal: Positive for myalgias and falls.  Skin: Negative for rash.    Neurological: Positive for weakness. Negative for seizures.       Right facial weakness  Psychiatric/Behavioral: Positive for depression.       Anxiety  All other systems reviewed and are negative.  Past Medical History  Diagnosis Date  . Hypertension   . 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 Anxiety   . Depression   . 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 Prostate cancer ([place])   . History of urinary retention   . History of prostatitis   . Chronic atrial fibrillation ([place])   . Dysrhythmia   . Type 2 diabetes mellitus ([facility])     NIDDM  . Complication of anesthesia     urinary retention  . Hyperlipidemia   . Neuromuscular disorder ([place])     imbalance s/p craniectomy  . Prostate cancer [facility])    Past Surgical History  Procedure Laterality Date  . Cholecystectomy  90's  . Retroperitoneal mass excision  03/31/2013  . Laparotomy N/A 03/31/2013    Procedure: Open resection of abdominal mass;  Surgeon: Stark Klein, MD;  Location: [place];  Service: General;  Laterality: N/A;  . Cardiac catheterization  1990  . Inguinal hernia repair Bilateral 05/24/2013    Procedure: bilateral open HERNIA REPAIR INGUINAL ADULT;  Surgeon: Gayland Curry, MD;  Location: [facility];  Service: General;  Laterality: Bilateral;  . Insertion of mesh Bilateral 05/24/2013    Procedure: INSERTION OF MESH;  Surgeon: Gayland Curry, MD;  Location: [facility];  Service: General;  Laterality:  Bilateral;  . Umbilical hernia repair  1990's  . Craniectomy for excision of acoustic neuroma Right 02-12-2001  . Hydrocele excision Right 11-26-2007  . Exploration face right side  11-29-2001  &  02-06-2003    post craniotomy resection acoustic neuroma--  facial paralysis  . Transthoracic echocardiogram  05-11-2007  (per PCP note)    ef 55-65%,  mild TR and MR   . Eye surgery Bilateral     cataract OU w/ IOL  . Transrectal ultrasound N/A 06/11/2015    Procedure: TRANSRECTAL ULTRASOUND AND BIOPSY;  Surgeon: Kathie Rhodes, MD;  Location: [facility];  Service: Urology;  Laterality: N/A;   History reviewed. No pertinent family history. Social History:  reports that he has never smoked. He has never used smokeless tobacco. He reports that he does not drink alcohol or use illicit drugs. Allergies: No Known Allergies Medications Prior to Admission  Medication Sig Dispense Refill  . acetaminophen (TYLENOL) 500 MG tablet Take 500 mg by mouth every 8 (eight) hours as needed for mild pain.    Marland Kitchen amLODipine (NORVASC) 5 MG tablet Take 5 mg by mouth every morning.     Marland Kitchen buPROPion (WELLBUTRIN) 100 MG tablet Take 100 mg by mouth 2 (two) times daily.     Marland Kitchen diltiazem (CARDIZEM CD) 240 MG 24 hr capsule Take 240 mg by mouth every morning.     . feeding supplement, ENSURE ENLIVE, (ENSURE ENLIVE) LIQD Take 237 mLs by mouth 2 (two) times daily between meals. (Patient taking differently: Take 237 mLs by mouth daily. ) 237 mL 12  . fenofibrate (TRICOR) 145 MG tablet Take 145 mg by mouth at bedtime.     . fish oil-omega-3 fatty acids 1000 MG capsule Take 1 g by mouth 2 (two) times daily.     Marland Kitchen LORazepam (ATIVAN) 0.5 MG tablet Take 0.5 mg by mouth 2 (two) times daily.    . metFORMIN (GLUCOPHAGE) 500 MG tablet Take 1,000 mg by mouth daily before supper.    . metoprolol succinate (TOPROL-XL) 100 MG 24 hr tablet Take 25 mg by mouth daily. Take with or immediately following a meal.    . tamsulosin (FLOMAX) 0.4 MG CAPS capsule Take 1 capsule (0.4 mg total) by mouth daily. (Patient taking differently: Take 0.8 mg by mouth at bedtime. ) 7 capsule 0  . warfarin (COUMADIN) 5 MG tablet Take 5-7.5 mg by mouth daily. Alternate daily between 5mg  to 7.5mg .    . rosuvastatin (CRESTOR) 5 MG tablet Take 5 mg by mouth every Monday. Monday      Home: Home Living Family/patient  expects to be discharged to:: Private residence Living Arrangements: Spouse/significant other (has dementia; requires 24/7 due to cognition) Available Help at Discharge: Personal care attendant (paying friend to help) Type of Home: House Home Access: Stairs to enter [employer] of Steps: 3 Entrance Stairs-Rails: None Home Layout: Able to live on main level with bedroom/bathroom Bathroom Shower/Tub: Holiday representative Accessibility: Yes Home Equipment: Environmental consultant - 2 wheels, Cane - single point, Shower seat, Bedside commode (BSC over toilet)  Functional History: Prior Function Level of Independence: Independent with assistive device(s) Comments: recent falls began due to imbalance after Oct 16 acoustic neuroma surgery; began using RW and has not fallen when using RW Functional Status:  Mobility: Bed Mobility Overal bed mobility: Modified Independent General bed mobility comments: with rail; pt requires incr time in sitting to allow imbalance sensation to subside Transfers Overall transfer level: Needs assistance Equipment used: Rolling  walker (2 wheeled) Transfers: Sit to/from Stand Sit to Stand: Min assist General transfer comment: x3; unsafe use of RW with tipping; uncontrolled descents Ambulation/Gait Ambulation/Gait assistance: Min assist Ambulation Distance (Feet): 70 Feet Assistive device: Rolling walker (2 wheeled) Gait Pattern/deviations: Step-through pattern, Decreased stride length, Ataxic General Gait Details: limited by fatigue of legs Gait velocity interpretation: Below normal speed for age/gender    ADL:    Cognition: Cognition Overall Cognitive Status: Within Functional Limits for tasks assessed Orientation Level: Oriented X4 Cognition Arousal/Alertness: Awake/alert Behavior During Therapy: WFL for tasks assessed/performed Overall Cognitive Status: Within Functional Limits for tasks assessed  Blood pressure 110/76, pulse 113, temperature 98.8  F (37.1 C), temperature source Oral, resp. rate 20, height 6' (1.829 m), weight 64.864 kg (143 lb), SpO2 98 %. Physical Exam  Constitutional: He is oriented to person, place, and time.  HENT:  Right facial droop  Eyes: EOM are normal.  Neck: Normal range of motion. Neck supple. No thyromegaly present.  Cardiovascular:  Irregular irregular  Respiratory: Effort normal and breath sounds normal. No respiratory distress.  GI: Soft. Bowel sounds are normal. He exhibits no distension.  Neurological: He is alert and oriented to person, place, and time. He displays normal reflexes. No cranial nerve deficit. He exhibits normal muscle tone. Coordination normal.  Follows full commands. Good insight and awareness. RIght peripheral 7 with facial droop and decreased right brow movement and lid closure. No hearing on right. UE: 4/5 bic,tric, wrist, HI. LE: 3/5 HF, 4/5 KE and 4/5 ADF/APF. Decreased LT/PP to bilateral LE's from feet to upper shin. FTN fairly intact bilaterally. No pronator drift.  Skin: Skin is warm and dry.  Psychiatric: He has a normal mood and affect. His behavior is normal.    Results for orders placed or performed during the hospital encounter of 09/23/15 (from the past 24 hour(s))  Glucose, capillary     Status: Abnormal   Collection Time: 09/24/15  3:44 PM  Result Value Ref Range   Glucose-Capillary 135 (H) 65 - 99 mg/dL  Glucose, capillary     Status: Abnormal   Collection Time: 09/24/15  8:27 PM  Result Value Ref Range   Glucose-Capillary 133 (H) 65 - 99 mg/dL   Comment 1 Notify RN    Comment 2 Document in Chart   Protime-INR     Status: Abnormal   Collection Time: 09/25/15  5:27 AM  Result Value Ref Range   Prothrombin Time 16.1 (H) 11.6 - 15.2 seconds   INR 1.27 0.00 - 99991111  Basic metabolic panel     Status: Abnormal   Collection Time: 09/25/15  5:27 AM  Result Value Ref Range   Sodium 137 135 - 145 mmol/L   Potassium 3.0 (L) 3.5 - 5.1 mmol/L   Chloride 99 (L) 101 -  111 mmol/L   CO2 31 22 - 32 mmol/L   Glucose, Bld 105 (H) 65 - 99 mg/dL   BUN 7 6 - 20 mg/dL   Creatinine, Ser 0.75 0.61 - 1.24 mg/dL   Calcium 8.5 (L) 8.9 - 10.3 mg/dL   GFR calc non Af Amer >60 >60 mL/min   GFR calc Af Amer >60 >60 mL/min   Anion gap 7 5 - 15  CBC     Status: Abnormal   Collection Time: 09/25/15  5:27 AM  Result Value Ref Range   WBC 4.4 4.0 - 10.5 K/uL   RBC 4.06 (L) 4.22 - 5.81 MIL/uL   Hemoglobin 10.4 (L) 13.0 -  17.0 g/dL   HCT 33.6 (L) 39.0 - 52.0 %   MCV 82.8 78.0 - 100.0 fL   MCH 25.6 (L) 26.0 - 34.0 pg   MCHC 31.0 30.0 - 36.0 g/dL   RDW 15.5 11.5 - 15.5 %   Platelets 315 150 - 400 K/uL  Magnesium     Status: Abnormal   Collection Time: 09/25/15  5:27 AM  Result Value Ref Range   Magnesium 1.4 (L) 1.7 - 2.4 mg/dL  Glucose, capillary     Status: Abnormal   Collection Time: 09/25/15  6:31 AM  Result Value Ref Range   Glucose-Capillary 101 (H) 65 - 99 mg/dL   Comment 1 Notify RN    Comment 2 Document in Chart   Glucose, capillary     Status: Abnormal   Collection Time: 09/25/15 11:20 AM  Result Value Ref Range   Glucose-Capillary 102 (H) 65 - 99 mg/dL   Comment 1 Notify RN    Comment 2 Document in Chart    Ct Head Wo Contrast  09/25/2015  CLINICAL DATA:  74 year old diabetic hypertensive male with atrial fibrillation on anticoagulation with subdural collection. Prior surgery for vestibular schwannoma. Recurrence. Subsequent encounter. EXAM: CT HEAD WITHOUT CONTRAST TECHNIQUE: Contiguous axial images were obtained from the base of the skull through the vertex without intravenous contrast. COMPARISON:  09/23/2015 and 06/28/2007 head CT. 08/07/2015 brain MR. FINDINGS: Right sided subdural broad-based collection with maximal thickness of 7.7 mm without significant change. Local mass effect without midline shift. Left-sided extra-axial collection may represent prominent subarachnoid spaces but appears slightly different than recent MR and remote CT and may  represent isodense left subdural collection with maximal thickness of 4.4 mm. Remote right occipital craniotomy for resection of vestibular schwannoma with recurrence as noted on recent MR. Encephalomalacia along the operative tract unchanged. No CT evidence of large acute infarct. Baseline atrophy. Vascular calcifications. Remote right nasal bone surgery with screw in place. IMPRESSION: Overall similar appearance to the recent CT with broad-based right-sided and possibly smaller left-sided subdural hematoma with local mass effect without midline shift. Postoperative changes right posterior fossa with recurrent right-sided vestibular schwannoma better delineated on recent MR. Electronically Signed   By: Genia Del M.D.   On: 09/25/2015 07:35   Ct Head Wo Contrast  09/23/2015  CLINICAL DATA:  New onset of weakness. Patient is anticoagulated, for atrial fibrillation. Multiple recent falls. History of recurrent vestibular schwannoma. EXAM: CT HEAD WITHOUT CONTRAST TECHNIQUE: Contiguous axial images were obtained from the base of the skull through the vertex without intravenous contrast. COMPARISON:  MR brain 08/07/2015. FINDINGS: Since the previous exam, the patient has developed an asymmetric extra-axial collection over the RIGHT frontal convexity, 6-7 mm thickness, displaying low to intermediate attenuation (25-44 Hounsfield units.) This was not present on recent MR, and is consistent with a subacute RIGHT subdural hematoma. No significant midline shift, but there is mild mass effect on the RIGHT frontal cortex. There is no definite LEFT-sided collection. Prominence of the extra-axial spaces on the LEFT is felt to represent atrophy. Generalized atrophy. Hypoattenuation of white matter representing chronic microvascular ischemic change. RIGHT suboccipital craniectomy and surgical encephalomalacia of the RIGHT lateral cerebellum. Soft tissue within the flared RIGHT IAC represents recurrent vestibular schwannoma.  Cavernous carotid vascular calcification. Cataract extraction. No sinus air-fluid level. Negative mastoids. IMPRESSION: Interval development since October 2016 MR of a 6-7 mm subacute subdural hematoma on the RIGHT. No midline shift, but slight mass effect on the frontal cortex. Neurosurgical consultation may  be warranted. Findings discussed with ordering provider. Electronically Signed   By: Staci Righter M.D.   On: 09/23/2015 16:59   Dg Chest Port 1 View  09/23/2015  CLINICAL DATA:  Weakness, possible CVA. EXAM: PORTABLE CHEST 1 VIEW COMPARISON:  02/14/2013 FINDINGS: Cardiomediastinal silhouette is normal. Mediastinal contours appear intact. Atherosclerotic disease of the aortic arch is noted. There is no evidence of focal airspace consolidation, pleural effusion or pneumothorax. There is a eventration of the right hemidiaphragm. Osseous structures are without acute abnormality. The bilateral humeral heads are high riding, suggestive of chronic rotator cuff injuries. Soft tissues are grossly normal. IMPRESSION: No radiographic evidence of acute cardiopulmonary abnormality. Atherosclerotic disease of the aorta. Electronically Signed   By: Fidela Salisbury M.D.   On: 09/23/2015 17:00    Assessment/Plan: Diagnosis: SDH after fall, hx of right acoustic neuroma 1. Does the need for close, 24 hr/day medical supervision in concert with the patient's rehab needs make it unreasonable for this patient to be served in a less intensive setting? Yes 2. Co-Morbidities requiring supervision/potential complications: afib, dm with dpn 3. Due to bladder management, bowel management, safety, skin/wound care, disease management, medication administration, pain management and patient education, does the patient require 24 hr/day rehab nursing? Yes 4. Does the patient require coordinated care of a physician, rehab nurse, PT (1-2 hrs/day, 5 days/week) and OT (1-2 hrs/day, 5 days/week) to address physical and functional  deficits in the context of the above medical diagnosis(es)? Yes Addressing deficits in the following areas: balance, endurance, locomotion, strength, transferring, bowel/bladder control, bathing, dressing, feeding, grooming, toileting and psychosocial support 5. Can the patient actively participate in an intensive therapy program of at least 3 hrs of therapy per day at least 5 days per week? Yes 6. The potential for patient to make measurable gains while on inpatient rehab is excellent 7. Anticipated functional outcomes upon discharge from inpatient rehab are modified independent  with PT, modified independent with OT, n/a with SLP. 8. Estimated rehab length of stay to reach the above functional goals is: 7 days 9. Does the patient have adequate social supports and living environment to accommodate these discharge functional goals? Yes 10. Anticipated D/C setting: Home 11. Anticipated post D/C treatments: HH therapy and Outpatient therapy 12. Overall Rehab/Functional Prognosis: excellent  RECOMMENDATIONS: This patient's condition is appropriate for continued rehabilitative care in the following setting: CIR Patient has agreed to participate in recommended program. Yes Note that insurance prior authorization may be required for reimbursement for recommended care.  Comment: Pt has capability of returning to mod I level using appropriate technique and equipment. He will NOT be able to care for his wife once home other than providing some supervision. Rehab Admissions Coordinator to follow up.  Thanks,  Meredith Staggers, MD, Mellody Drown     09/25/2015

## 2015-09-25 NOTE — Evaluation (Addendum)
Physical Therapy Evaluation Patient Details Name: Tommy Weeks MRN: [id number] DOB: [birth year] Today's Date: 09/25/2015   History of Present Illness  Adm s/p multiple falls with Rt SDH and possible small Lt SDH. 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 Pt reports no improvement of symptoms post-procedure PMHx- afib on coumadin, acoustic neuroma surgery '02 with Rt facial nerve palsy (facial droop), DM, prostate Ca    Clinical Impression  Pt admitted with above diagnosis. His falls have occurred when NOT using an assistive device and since his second acoustic neuroma surgery 08/20/15. Demonstrated unsafe use of RW, however able to make corrections with instruction and shows good ability to improve his safety. Decreased safety awareness re: how to compensate his movements to minimize triggering vestibular/imbalance sensations (move more slowly--especially with turns; with changes in position, wait until symptoms subside prior to progressing activity). Pt currently with functional limitations due to the deficits listed below (see PT Problem List). Pt will benefit from skilled PT to increase their independence and safety with mobility to allow discharge to the venue listed below.       Follow Up Recommendations CIR    Equipment Recommendations  None recommended by PT    Recommendations for Other Services Rehab consult;OT consult     Precautions / Restrictions Precautions Precautions: Fall Restrictions Weight Bearing Restrictions: No      Mobility  Bed Mobility Overal bed mobility: Modified Independent             General bed mobility comments: with rail; pt requires incr time in sitting to allow imbalance sensation to subside  Transfers Overall transfer level: Needs assistance Equipment used: Rolling walker (2 wheeled) Transfers: Sit to/from Stand Sit to Stand: Min assist         General transfer comment: x3; unsafe use of RW with  tipping; uncontrolled descents  Ambulation/Gait Ambulation/Gait assistance: Min assist Ambulation Distance (Feet): 70 Feet Assistive device: Rolling walker (2 wheeled) Gait Pattern/deviations: Step-through pattern;Decreased stride length;Ataxic   Gait velocity interpretation: Below normal speed for age/gender General Gait Details: limited by fatigue of legs  Stairs            Wheelchair Mobility    Modified Rankin (Stroke Patients Only)       Balance Overall balance assessment: Needs assistance;History of Falls Sitting-balance support: No upper extremity supported;Feet supported Sitting balance-Leahy Scale: Fair     Standing balance support: No upper extremity supported Standing balance-Leahy Scale: Poor Standing balance comment: unsteady on initial standing with imbalance sensation                             Pertinent Vitals/Pain Pain Assessment: No/denies pain    Home Living Family/patient expects to be discharged to:: Private residence Living Arrangements: Spouse/significant other (has dementia; requires 24/7 due to cognition) Available Help at Discharge: Personal care attendant (paying friend to help) Type of Home: House Home Access: Stairs to enter Entrance Stairs-Rails: None Entrance Stairs-Number of Steps: 3 Home Layout: Able to live on main level with bedroom/bathroom Home Equipment: Walker - 2 wheels;Cane - single point;Shower seat;Bedside commode (BSC over toilet)      Prior Function Level of Independence: Independent with assistive device(s)         Comments: recent falls began due to imbalance after Oct 16 acoustic neuroma surgery; began using RW and has not fallen when using RW     Hand Dominance  Extremity/Trunk Assessment   Upper Extremity Assessment: Defer to OT evaluation           Lower Extremity Assessment: RLE deficits/detail;LLE deficits/detail RLE Deficits / Details: 5/5 except dorsiflexion 4+/5 LLE  Deficits / Details: hip flexion 3/5, knee extension 3+, ankle dorsiflexion 3+  Cervical / Trunk Assessment: Normal  Communication   Communication: Expressive difficulties (due to facial nerve palsy)  Cognition Arousal/Alertness: Awake/alert Behavior During Therapy: WFL for tasks assessed/performed Overall Cognitive Status: Within Functional Limits for tasks assessed                      General Comments      Exercises        Assessment/Plan    PT Assessment Patient needs continued PT services  PT Diagnosis Abnormality of gait   PT Problem List Decreased strength;Decreased activity tolerance;Decreased balance;Decreased mobility;Decreased knowledge of use of DME;Decreased knowledge of precautions;Impaired sensation  PT Treatment Interventions DME instruction;Gait training;Stair training;Functional mobility training;Therapeutic activities;Therapeutic exercise;Balance training;Neuromuscular re-education;Patient/family education   PT Goals (Current goals can be found in the Care Plan section) Acute Rehab PT Goals Patient Stated Goal: to be able to care for his wife in their home PT Goal Formulation: With patient Time For Goal Achievement: 10/02/15 Potential to Achieve Goals: Good    Frequency Min 4X/week   Barriers to discharge Decreased caregiver support cares for wife with dementia (no physical assist needed); currently paying a friend/attendant to stay with her    Co-evaluation               End of Session Equipment Utilized During Treatment: Gait belt Activity Tolerance: Patient limited by fatigue Patient left: in chair;with call bell/phone within reach;with chair alarm set Nurse Communication: Mobility status;Other (comment) (no clock in room)         Time: [id number] PT Time Calculation (min) (ACUTE ONLY): 40 min   Charges:   PT Evaluation $Initial PT Evaluation Tier I: 1 Procedure (charge reduced due to interrupted session by MD) PT  Treatments $Gait Training: 8-22 mins   PT G Codes:        Milyn Stapleton 10/24/2015, 11:34 AM Pager [phone]

## 2015-09-26 ENCOUNTER — Inpatient Hospital Stay (HOSPITAL_COMMUNITY)
Admission: AD | Admit: 2015-09-26 | Discharge: 2015-10-08 | DRG: 950 | Disposition: A | Discharge status: Home Health | Payer: Medicare Other | Source: Intra-hospital | Attending: Physical Medicine & Rehabilitation | Admitting: Physical Medicine & Rehabilitation

## 2015-09-26 DIAGNOSIS — R531 Weakness: Secondary | ICD-10-CM | POA: Diagnosis present

## 2015-09-26 DIAGNOSIS — I10 Essential (primary) hypertension: Secondary | ICD-10-CM

## 2015-09-26 DIAGNOSIS — H9191 Unspecified hearing loss, right ear: Secondary | ICD-10-CM | POA: Diagnosis present

## 2015-09-26 DIAGNOSIS — L0591 Pilonidal cyst without abscess: Secondary | ICD-10-CM | POA: Diagnosis present

## 2015-09-26 DIAGNOSIS — F419 Anxiety disorder, unspecified: Secondary | ICD-10-CM | POA: Diagnosis present

## 2015-09-26 DIAGNOSIS — S065X0S Traumatic subdural hemorrhage without loss of consciousness, sequela: Secondary | ICD-10-CM

## 2015-09-26 DIAGNOSIS — N401 Enlarged prostate with lower urinary tract symptoms: Secondary | ICD-10-CM | POA: Diagnosis present

## 2015-09-26 DIAGNOSIS — E1140 Type 2 diabetes mellitus with diabetic neuropathy, unspecified: Secondary | ICD-10-CM | POA: Insufficient documentation

## 2015-09-26 DIAGNOSIS — E876 Hypokalemia: Secondary | ICD-10-CM

## 2015-09-26 DIAGNOSIS — N400 Enlarged prostate without lower urinary tract symptoms: Secondary | ICD-10-CM

## 2015-09-26 DIAGNOSIS — F418 Other specified anxiety disorders: Secondary | ICD-10-CM | POA: Diagnosis present

## 2015-09-26 DIAGNOSIS — E119 Type 2 diabetes mellitus without complications: Secondary | ICD-10-CM

## 2015-09-26 DIAGNOSIS — R338 Other retention of urine: Secondary | ICD-10-CM | POA: Diagnosis present

## 2015-09-26 DIAGNOSIS — Z923 Personal history of irradiation: Secondary | ICD-10-CM | POA: Diagnosis not present

## 2015-09-26 DIAGNOSIS — F32A Depression, unspecified: Secondary | ICD-10-CM | POA: Insufficient documentation

## 2015-09-26 DIAGNOSIS — I4891 Unspecified atrial fibrillation: Secondary | ICD-10-CM | POA: Diagnosis not present

## 2015-09-26 DIAGNOSIS — I480 Paroxysmal atrial fibrillation: Secondary | ICD-10-CM | POA: Insufficient documentation

## 2015-09-26 DIAGNOSIS — E1142 Type 2 diabetes mellitus with diabetic polyneuropathy: Secondary | ICD-10-CM | POA: Diagnosis present

## 2015-09-26 DIAGNOSIS — F329 Major depressive disorder, single episode, unspecified: Secondary | ICD-10-CM | POA: Diagnosis present

## 2015-09-26 DIAGNOSIS — S065X0A Traumatic subdural hemorrhage without loss of consciousness, initial encounter: Secondary | ICD-10-CM | POA: Insufficient documentation

## 2015-09-26 DIAGNOSIS — D333 Benign neoplasm of cranial nerves: Secondary | ICD-10-CM | POA: Diagnosis present

## 2015-09-26 DIAGNOSIS — I482 Chronic atrial fibrillation: Secondary | ICD-10-CM | POA: Diagnosis present

## 2015-09-26 DIAGNOSIS — S065X0D Traumatic subdural hemorrhage without loss of consciousness, subsequent encounter: Secondary | ICD-10-CM | POA: Diagnosis present

## 2015-09-26 DIAGNOSIS — R29810 Facial weakness: Secondary | ICD-10-CM | POA: Diagnosis present

## 2015-09-26 DIAGNOSIS — S065XAA Traumatic subdural hemorrhage with loss of consciousness status unknown, initial encounter: Secondary | ICD-10-CM | POA: Diagnosis present

## 2015-09-26 DIAGNOSIS — S065X9A Traumatic subdural hemorrhage with loss of consciousness of unspecified duration, initial encounter: Secondary | ICD-10-CM | POA: Diagnosis present

## 2015-09-26 DIAGNOSIS — R296 Repeated falls: Secondary | ICD-10-CM

## 2015-09-26 LAB — GLUCOSE, CAPILLARY
GLUCOSE-CAPILLARY: 86 mg/dL (ref 65–99)
GLUCOSE-CAPILLARY: 101 mg/dL — AB (ref 65–99)
GLUCOSE-CAPILLARY: 92 mg/dL (ref 65–99)
GLUCOSE-CAPILLARY: 113 mg/dL — AB (ref 65–99)

## 2015-09-26 LAB — CBC WITH DIFFERENTIAL/PLATELET
Basophils Absolute: 0 10*3/uL (ref 0.0–0.1)
Basophils Relative: 0 %
EOS ABS: 0 10*3/uL (ref 0.0–0.7)
Eosinophils Relative: 0 %
HCT: 35.5 % — ABNORMAL LOW (ref 39.0–52.0)
HEMOGLOBIN: 11.1 g/dL — AB (ref 13.0–17.0)
LYMPHS ABS: 0.5 10*3/uL — AB (ref 0.7–4.0)
LYMPHS PCT: 9 %
MCH: 25.8 pg — AB (ref 26.0–34.0)
MCHC: 31.3 g/dL (ref 30.0–36.0)
MCV: 82.4 fL (ref 78.0–100.0)
MONOS PCT: 15 %
Monocytes Absolute: 0.9 10*3/uL (ref 0.1–1.0)
NEUTROS PCT: 76 %
Neutro Abs: 4.7 10*3/uL (ref 1.7–7.7)
Platelets: 345 10*3/uL (ref 150–400)
RBC: 4.31 MIL/uL (ref 4.22–5.81)
RDW: 15.5 % (ref 11.5–15.5)
WBC: 6.1 10*3/uL (ref 4.0–10.5)

## 2015-09-26 LAB — COMPREHENSIVE METABOLIC PANEL
ALK PHOS: 104 U/L (ref 38–126)
ALT: 19 U/L (ref 17–63)
ANION GAP: 8 (ref 5–15)
AST: 28 U/L (ref 15–41)
Albumin: 2.4 g/dL — ABNORMAL LOW (ref 3.5–5.0)
BILIRUBIN TOTAL: 1 mg/dL (ref 0.3–1.2)
BUN: 10 mg/dL (ref 6–20)
CALCIUM: 8.8 mg/dL — AB (ref 8.9–10.3)
CO2: 30 mmol/L (ref 22–32)
CREATININE: 0.99 mg/dL (ref 0.61–1.24)
Chloride: 96 mmol/L — ABNORMAL LOW (ref 101–111)
Glucose, Bld: 131 mg/dL — ABNORMAL HIGH (ref 65–99)
Potassium: 4.2 mmol/L (ref 3.5–5.1)
Sodium: 134 mmol/L — ABNORMAL LOW (ref 135–145)
TOTAL PROTEIN: 5.4 g/dL — AB (ref 6.5–8.1)

## 2015-09-26 LAB — BASIC METABOLIC PANEL
ANION GAP: 7 (ref 5–15)
BUN: 8 mg/dL (ref 6–20)
CHLORIDE: 100 mmol/L — AB (ref 101–111)
CO2: 29 mmol/L (ref 22–32)
Calcium: 8.4 mg/dL — ABNORMAL LOW (ref 8.9–10.3)
Creatinine, Ser: 0.77 mg/dL (ref 0.61–1.24)
Glucose, Bld: 103 mg/dL — ABNORMAL HIGH (ref 65–99)
POTASSIUM: 3.4 mmol/L — AB (ref 3.5–5.1)
SODIUM: 136 mmol/L (ref 135–145)

## 2015-09-26 LAB — MAGNESIUM: MAGNESIUM: 1.6 mg/dL — AB (ref 1.7–2.4)

## 2015-09-26 MED ORDER — ACETAMINOPHEN 325 MG PO TABS
650.0000 mg | ORAL_TABLET | ORAL | Status: DC | PRN
  Administered 2015-10-06: 650 mg via ORAL
  Filled 2015-09-26: qty 2

## 2015-09-26 MED ORDER — BOOST / RESOURCE BREEZE PO LIQD
1.0000 | Freq: Three times a day (TID) | ORAL | Status: DC
  Administered 2015-09-27: 1 via ORAL

## 2015-09-26 MED ORDER — DILTIAZEM HCL ER COATED BEADS 240 MG PO CP24
240.0000 mg | ORAL_CAPSULE | Freq: Every morning | ORAL | Status: DC
  Administered 2015-09-27 – 2015-10-02 (×5): 240 mg via ORAL
  Filled 2015-09-26 (×8): qty 1

## 2015-09-26 MED ORDER — LORAZEPAM 0.5 MG PO TABS
0.5000 mg | ORAL_TABLET | Freq: Four times a day (QID) | ORAL | Status: DC | PRN
  Filled 2015-09-26: qty 1

## 2015-09-26 MED ORDER — METOPROLOL SUCCINATE ER 25 MG PO TB24
25.0000 mg | ORAL_TABLET | Freq: Every day | ORAL | Status: DC
  Administered 2015-09-27 – 2015-09-30 (×3): 25 mg via ORAL
  Filled 2015-09-26 (×4): qty 1

## 2015-09-26 MED ORDER — INSULIN ASPART 100 UNIT/ML ~~LOC~~ SOLN
0.0000 [IU] | Freq: Three times a day (TID) | SUBCUTANEOUS | Status: DC
  Administered 2015-09-27 – 2015-10-03 (×3): 2 [IU] via SUBCUTANEOUS

## 2015-09-26 MED ORDER — TAMSULOSIN HCL 0.4 MG PO CAPS
0.4000 mg | ORAL_CAPSULE | Freq: Every day | ORAL | Status: DC
  Administered 2015-09-27 – 2015-10-08 (×12): 0.4 mg via ORAL
  Filled 2015-09-26 (×12): qty 1

## 2015-09-26 MED ORDER — BUPROPION HCL 100 MG PO TABS
100.0000 mg | ORAL_TABLET | Freq: Two times a day (BID) | ORAL | Status: DC
  Administered 2015-09-26 – 2015-10-08 (×24): 100 mg via ORAL
  Filled 2015-09-26 (×28): qty 1

## 2015-09-26 MED ORDER — SORBITOL 70 % SOLN: 30.0000 mL | Freq: Every day | Status: DC | PRN

## 2015-09-26 MED ORDER — AMLODIPINE BESYLATE 5 MG PO TABS
5.0000 mg | ORAL_TABLET | Freq: Every morning | ORAL | Status: DC
  Administered 2015-09-27 – 2015-10-02 (×5): 5 mg via ORAL
  Filled 2015-09-26 (×7): qty 1

## 2015-09-26 MED ORDER — ONDANSETRON HCL 4 MG PO TABS: 4.0000 mg | ORAL_TABLET | Freq: Four times a day (QID) | ORAL | Status: DC | PRN

## 2015-09-26 MED ORDER — ONDANSETRON HCL 4 MG/2ML IJ SOLN: 4.0000 mg | Freq: Four times a day (QID) | INTRAMUSCULAR | Status: DC | PRN

## 2015-09-26 NOTE — Progress Notes (Signed)
Pt transferred to inpatient rehab per MD orders. Pt alert and oriented and educated on transfer. Pt received educational material for rehab with belongings. Pt transferred to unit via wheelchair. All questions and concerns were answered by RN before transfer. IV still intact.

## 2015-09-26 NOTE — Progress Notes (Signed)
Retta Diones, RN Rehab Admission Coordinator Signed Physical Medicine and Rehabilitation PMR Pre-admission 09/26/2015 11:52 AM  Related encounter: ED to Hosp-Admission (Current) from 09/23/2015 in [place] Collapse All   PMR Admission Coordinator Pre-Admission Assessment  Patient: Tommy Weeks is an 74 y.o., male MRN: [id number] DOB: 10-16-1941 Height: 6' (182.9 cm) Weight: 63.594 kg (140 lb 3.2 oz)  Insurance Information HMO: No PPO: PCP: IPA: 80/20: OTHER:  PRIMARY: Medicare A/B Policy#: [id number] Subscriber: Tommy Weeks CM Name: Phone#: Fax#:  Pre-Cert#: Employer: Retired Benefits: Phone #: Name: Checked in [place]. Date: 07/20/04 Deduct: $1288 Out of Pocket Max: none Life Max: unlimited CIR: 100% SNF: 100 days Outpatient: 80% Co-Pay: 20% Home Health: 100% Co-Pay: none DME: 80% Co-Pay: 20% Providers: patient's choice  SECONDARY: BCBS federal emp PPO Policy#: [id number] Subscriber: [facility] Weeks CM Name: Phone#: Fax#:  Pre-Cert#: Employer: Retired Benefits: Phone #: [phone] Name:  Eff. Date: Deduct: Out of Pocket Max: Life Max:  CIR: SNF:  Outpatient: Co-Pay:  Home Health: Co-Pay:  DME: Co-Pay:   Emergency Contact Information Contact Information    Name Relation Home Work Mobile   Tommy Weeks Spouse [phone]     Tommy Weeks [phone]     [facility] Weeks [phone]       Current Medical History  Patient Admitting Diagnosis: Traumatic R SDH after fall, hx of right acoustic neuroma  History of Present Illness: A 74 y.o. 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 Patient lives with spouse who has reported dementia require 24-hour care. He has a Personal care attendant to help his wife. Patient uses a single-point cane prior to admission. 2 level home 3 steps to entry with bedroom first floor. Presented 09/23/2015 with weakness lower extremities and to a lesser extent upper extremities. He does note 3 separate falls over the past 2 weeks hitting his head against a door jam in the bathroom. INR on admission of 2.97. CT of the brain showed a right frontal isodense subacute subdural hematoma without significant mass effect. Patient's Coumadin was reversed. Follow-up neurosurgery Dr. Sherwood Gambler advise conservative care. Latest follow-up cranial CT scan 09/25/2015 with overall appearance smaller left-sided subdural hematoma without midline shift. Tolerating a regular consistency diet. Bouts of hypokalemia 3.0 with potassium supplement added. Physical therapy evaluation completed 09/25/2015 with recommendations of physical medicine rehabilitation consult. Patient to be admitted for a comprehensive inpatient rehab program.   Past Medical History  Past Medical History  Diagnosis Date  . Hypertension   . 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 Anxiety   . Depression   . History of acoustic neurofibromatosis     S/P RESECTION 02-12-2001 RESIDUAL RIGHT SIDE FACE PARALYSIS AND DEAFNESS RIGHT EAR  . 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 Prostate cancer ([place])   . History of urinary retention   . History of prostatitis   . Chronic atrial fibrillation ([place])   . Dysrhythmia   . Type 2 diabetes mellitus (HCC)     NIDDM  . Complication of anesthesia      urinary retention  . Hyperlipidemia   . Neuromuscular disorder ([place])     imbalance s/p craniectomy  . Prostate cancer [facility])     Family History  family history is not  on file.  Prior Rehab/Hospitalizations: No previous rehab admissions.  Has the patient had major surgery during 100 days prior to admission? No  Current Medications   Current facility-administered medications:  . 0.9 % sodium chloride infusion, 250 mL, Intravenous, PRN, Marijean Heath, NP . acetaminophen (TYLENOL) tablet 650 mg, 650 mg, Oral, Q4H PRN, Marijean Heath, NP, 650 mg at 09/25/15 2151 . amLODipine (NORVASC) tablet 5 mg, 5 mg, Oral, q morning - 10a, Marijean Heath, NP, 5 mg at 09/26/15 1346 . buPROPion [facility]) tablet 100 mg, 100 mg, Oral, BID, Marijean Heath, NP, 100 mg at 09/26/15 1346 . diltiazem (CARDIZEM CD) 24 hr capsule 240 mg, 240 mg, Oral, q morning - 10a, Marijean Heath, NP, 240 mg at 09/26/15 1345 . feeding supplement (BOOST / RESOURCE BREEZE) liquid 1 Container, 1 Container, Oral, TID BM, Rush Farmer, MD, 1 Container at 09/25/15 1119 . insulin aspart (novoLOG) injection 0-15 Units, 0-15 Units, Subcutaneous, TID WC, Chesley Mires, MD, 2 Units at 09/25/15 1713 . insulin aspart (novoLOG) injection 0-5 Units, 0-5 Units, Subcutaneous, QHS, Chesley Mires, MD, 0 Units at 09/24/15 2153 . LORazepam (ATIVAN) tablet 0.5 mg, 0.5 mg, Oral, Q6H PRN, Marijean Heath, NP . metoprolol succinate (TOPROL-XL) 24 hr tablet 25 mg, 25 mg, Oral, Q breakfast, Marijean Heath, NP, Stopped at 09/26/15 [phone] . ondansetron (ZOFRAN) injection 4 mg, 4 mg, Intravenous, Q6H PRN, Marijean Heath, NP . tamsulosin (FLOMAX) capsule 0.4 mg, 0.4 mg, Oral, Daily, Marijean Heath, NP, 0.4 mg at 09/26/15 1345  Patients Current Diet: Diet Carb Modified Fluid consistency:: Thin; Room service appropriate?: Yes  Precautions / Restrictions Precautions Precautions:  Fall Restrictions Weight Bearing Restrictions: No   Has the patient had 2 or more falls or a fall with injury in the past year?Yes. Has had 3 falls and the most recent fall resulted in injury.  Prior Activity Level Community (5-7x/wk): Went out 4 X a week. Was driving. He is the caregiver for his wife who has dementia.  Home Assistive Devices / Equipment Home Assistive Devices/Equipment: Environmental consultant (specify type), Eyeglasses Home Equipment: Walker - 2 wheels, Cane - single point, Shower seat, Bedside commode  Prior Device Use: Indicate devices/aids used by the patient prior to current illness, exacerbation or injury? Straight Cane  Prior Functional Level Prior Function Level of Independence: Independent with assistive device(s) Comments: recent falls began due to imbalance after Oct 16 acoustic neuroma surgery; began using RW and has not fallen when using RW  Self Care: Did the patient need help bathing, dressing, using the toilet or eating? Independent  Indoor Mobility: Did the patient need assistance with walking from room to room (with or without device)? Independent  Stairs: Did the patient need assistance with internal or external stairs (with or without device)? Independent  Functional Cognition: Did the patient need help planning regular tasks such as shopping or remembering to take medications? Independent  Current Functional Level Cognition  Overall Cognitive Status: Within Functional Limits for tasks assessed Orientation Level: Oriented X4   Extremity Assessment (includes Sensation/Coordination)  Upper Extremity Assessment: Overall WFL for tasks assessed, Generalized weakness, LUE deficits/detail LUE Deficits / Details: tremulous with gross LUE MMT  Lower Extremity Assessment: Defer to PT evaluation RLE Deficits / Details: 5/5 except dorsiflexion 4+/5 RLE Sensation: decreased light touch (?peripheral neuropathy undiagnosed) RLE Coordination: decreased gross  motor (poor heel to shin; ataxic) LLE Deficits / Details: hip flexion 3/5, knee extension 3+, ankle dorsiflexion 3+ LLE Sensation: decreased  light touch (?peripheral neuropathy undiagnosed) LLE Coordination: (heel to shin normal)    ADLs  Overall ADL's : Needs assistance/impaired Eating/Feeding: Set up, Sitting Grooming: Minimal assistance, Wash/dry hands, Standing Grooming Details (indicate cue type and reason): fatigues easily, min A for balance Upper Body Bathing: Sitting, Minimal assitance Lower Body Bathing: Moderate assistance, Sit to/from stand Upper Body Dressing : Minimal assistance, Sitting Lower Body Dressing: Moderate assistance, Sit to/from stand Toilet Transfer: Moderate assistance, Ambulation, RW, Grab bars (3n1 over toilet) Toileting- Clothing Manipulation and Hygiene: Moderate assistance, Sit to/from stand Tub/ Shower Transfer: Moderate assistance, Ambulation, 3 in 1, Rolling walker Functional mobility during ADLs: Moderate assistance, Rolling walker General ADL Comments: Pt completed in-room functional mobility, toilet transfer, and grooming task as detailed below. Pt fatigues easily. Pt initially completing practice of ambulating to bathroom for toilet transfer. Upon returning to EOB pt reporting urgent need to toilet. Pt with decreased safety awareness, increased speed and decreased balance to return to toilet. Pt with LOB in bathroom requiring mod A to prevent a fall. Pt cued to slow down and focus on his balance. Pt with some urinary incontinence.     Mobility  Overal bed mobility: Modified Independent General bed mobility comments: pt requires incr time in sitting to allow imbalance sensation to subside    Transfers  Overall transfer level: Needs assistance Equipment used: Rolling walker (2 wheeled) Transfers: Sit to/from Stand Sit to Stand: Min assist General transfer comment: x3; unsafe use of RW with tipping; uncontrolled descents    Ambulation /  Gait / Stairs / Wheelchair Mobility  Ambulation/Gait Ambulation/Gait assistance: Museum/gallery curator (Feet): 70 Feet Assistive device: Rolling walker (2 wheeled) Gait Pattern/deviations: Step-through pattern, Decreased stride length, Ataxic General Gait Details: limited by fatigue of legs Gait velocity: encouraged to decr especially with turns or head turns to minimize dizziness/imbalance Gait velocity interpretation: Below normal speed for age/gender    Posture / Balance Static Standing Balance [place] - Eyes Opened: 30 (no UE support, minguard) [place] - Eyes Closed: 10 (no UE support, minguard; >normal sway) Balance Overall balance assessment: Needs assistance Sitting-balance support: No upper extremity supported, Feet supported Sitting balance-Leahy Scale: Fair Standing balance support: Bilateral upper extremity supported, During functional activity Standing balance-Leahy Scale: Poor Standing balance comment: min A for steading Rhomberg - Eyes Opened: 30 (no UE support, minguard) [place] - Eyes Closed: 10 (no UE support, minguard; >normal sway) High level balance activites: Backward walking, Direction changes, Turns, Head turns High Level Balance Comments: pt able to carryover instructions to turn slowly from 12/6, however continues to turn head abruptly in standing, triggering vestibular symptoms    Special needs/care consideration BiPAP/CPAP No CPM No Continuous Drip IV 0.9% NS at 50 ml/hr Dialysis No  Life Vest No Oxygen No Special Bed No Trach Size No Wound Vac (area) No  Skin No  Bowel mgmt: Last documented BM 09/23/15 but patient reports BM 09/25/15 Bladder mgmt: Voiding in urinal and in bathroom with assistance Diabetic mgmt Yes, on oral medications at home.    Previous Home Environment Living Arrangements: Spouse/significant other (spouse has dementia; requires 24/7 due to cognition) Available Help at  Discharge: Personal care attendant, Other (Comment) (paying friend to help ) Type of Home: House Home Layout: Able to live on main level with bedroom/bathroom Home Access: Stairs to enter Entrance Stairs-Rails: None Entrance Stairs-Number of Steps: 3 Bathroom Shower/Tub: Holiday representative Accessibility: Yes [place]: No  Discharge Living Setting Plans for Discharge Living Setting: Patient's home,  House, Lives with (comment) (Lives with wife.) Type of Home at Discharge: House Discharge Home Layout: Two level, Able to live on main level with bedroom/bathroom Alternate Level Stairs-Number of Steps: Flight Discharge Home Access: Stairs to enter [employer] of Steps: 2 steps at front and 3 steps at garage entry. Does the patient have any problems obtaining your medications?: No  Social/Family/Support Systems Patient Roles: Spouse, Parent (Wife with dementia. Has 3 sons.) Contact Information: Jayzen Austerman - son [phone] Anticipated Caregiver: Son, sister, and hired caregiver Ability/Limitations of Caregiver: Sister works, son works, has a hired caregiver for his wife and she will be there for patient as well. Caregiver Availability: Other (Comment) (Will have someone in the home most of the time.) Discharge Plan Discussed with Primary Caregiver: Yes Is Caregiver In Agreement with Plan?: Yes Does Caregiver/Family have Issues with Lodging/Transportation while Pt is in Rehab?: No  Goals/Additional Needs Patient/Family Goal for Rehab: PT/OT mod I goals Expected length of stay: 7 days Cultural Considerations: None Dietary Needs: Carb mod med cal, thin liquids Equipment Needs: TBD Pt/Family Agrees to Admission and willing to participate: Yes Program Orientation Provided & Reviewed with Pt/Caregiver Including Roles & Responsibilities: Yes  Decrease burden of Care through IP rehab admission: N/A  Possible need for SNF placement upon discharge: Not  planned  Patient Condition: This patient's condition remains as documented in the consult dated 09/25/15, in which the Rehabilitation Physician determined and documented that the patient's condition is appropriate for intensive rehabilitative care in an inpatient rehabilitation facility. Will admit to inpatient rehab today.  Preadmission Screen Completed By: Retta Diones, 09/26/2015 3:29 PM ______________________________________________________________________  Discussed status with Dr. Posey Pronto on 09/26/15 at 1529 and received telephone approval for admission today.  Admission Coordinator: Retta Diones, time1529/Date12/07/16          Cosigned by: Ankit Lorie Phenix, MD at 09/26/2015 3:35 PM  Revision History     Date/Time User Provider Type Action   09/26/2015 3:35 PM Ankit Lorie Phenix, MD Physician Cosign   09/26/2015 3:29 PM Retta Diones, RN Rehab Admission Coordinator Sign

## 2015-09-26 NOTE — Progress Notes (Signed)
Tommy Weeks [id number] DOB: May 28, 1941 DOA: 09/23/2015 PCP: Jani Gravel, MD   Brief Narrative:   Tommy Weeks is an 74 y.o. male with past medical history of essential hypertension, A. fib on Coumadin presents to the [facility]: 09/23/2015 with a history of left upper extremity weakness and multiple falls at home.  Assessment/Plan:    SDH (subdural hematoma) ([place]): In the context of frequent falls while on Coumadin. Her INR was reversed. Initially admitted by the ICU team and transferred to [facility] on 09/25/2015. Neuro surgery recommended no further intervention. Neurosurgery recommended no anticoagulation or antiplatelet therapy for his atrial fibrillation for the next 2 months. Although due to his frequent falls he should not be on oral anticoagulation. Repeat CT of the head on 09/25/2015 showed a hematoma with local mass effect but no midline shift or worsening compared to previous.  Warfarin-induced coagulopathy ([place]) He was given vitamin K not his INR is less than 1.2.  Generalized weakness and frequent falls: Will be transferred to inpatient rehabilitation when medically stable.  A. fib with controlled ventricular rate: Continue Cardizem and metoprolol. Due to his history of frequent falls would not recommend oral anticoagulation. Follow-up with Dr. Harriet Pho as an outpatient.  Essential hypertension: Continue antihypertensive medication.  Hypokalemia/hypomagnesemia: Replacement follow-up.  Controlled diabetes mellitus type 2: Performing had an admission continue sliding scale insulin.   DVT Prophylaxis - SCD's  Family Communication: none Disposition Plan: Home when stable. Code Status:     Code Status Orders        Start     Ordered   09/23/15 2108  Full code   Continuous     09/23/15 2109    Advance Directive Documentation        Most Recent Value   Type of Advance Directive  Living will   Pre-existing  out of facility DNR order (yellow form or pink MOST form)     "MOST" Form in Place?          IV Access:    Peripheral IV   Procedures and diagnostic studies:   Ct Head Wo Contrast  09/25/2015  CLINICAL DATA:  74 year old diabetic hypertensive male with atrial fibrillation on anticoagulation with subdural collection. Prior surgery for vestibular schwannoma. Recurrence. Subsequent encounter. EXAM: CT HEAD WITHOUT CONTRAST TECHNIQUE: Contiguous axial images were obtained from the base of the skull through the vertex without intravenous contrast. COMPARISON:  09/23/2015 and 06/28/2007 head CT. 08/07/2015 brain MR. FINDINGS: Right sided subdural broad-based collection with maximal thickness of 7.7 mm without significant change. Local mass effect without midline shift. Left-sided extra-axial collection may represent prominent subarachnoid spaces but appears slightly different than recent MR and remote CT and may represent isodense left subdural collection with maximal thickness of 4.4 mm. Remote right occipital craniotomy for resection of vestibular schwannoma with recurrence as noted on recent MR. Encephalomalacia along the operative tract unchanged. No CT evidence of large acute infarct. Baseline atrophy. Vascular calcifications. Remote right nasal bone surgery with screw in place. IMPRESSION: Overall similar appearance to the recent CT with broad-based right-sided and possibly smaller left-sided subdural hematoma with local mass effect without midline shift. Postoperative changes right posterior fossa with recurrent right-sided vestibular schwannoma better delineated on recent MR. Electronically Signed   By: Genia Del M.D.   On: 09/25/2015 07:35     Medical Consultants:    None.  Anti-Infectives:   Anti-infectives    None  Subjective:    Tommy Weeks no complains  Objective:    Filed Vitals:   09/26/15 0128 09/26/15 0500 09/26/15 0540 09/26/15 1026  BP: 110/75  94/56  117/71  Pulse: 111  114 109  Temp: 98.4 F (36.9 C)  98.5 F (36.9 C) 98.2 F (36.8 C)  TempSrc: Oral  Oral Oral  Resp: 17  18 19   Height:      Weight:  63.594 kg (140 lb 3.2 oz)    SpO2: 99%  98% 97%    Intake/Output Summary (Last 24 hours) at 09/26/15 1403 Last data filed at 09/26/15 0804  Gross per 24 hour  Intake      0 ml  Output   1100 ml  Net  -1100 ml   Filed Weights   09/24/15 0500 09/25/15 0447 09/26/15 0500  Weight: 63.3 kg (139 lb 8.8 oz) 64.864 kg (143 lb) 63.594 kg (140 lb 3.2 oz)    Exam: Gen:  NAD Cardiovascular:  RRR, No M/R/GChest and lungs:   CTAB Abdomen:  Abdomen soft, NT/ND, + BS Extremities:  No C/E/C Neuro: face deviated to the left. 4/5 on the left lower ext.   Data Reviewed:    Labs: Basic Metabolic Panel:  Recent Labs Lab 09/23/15 1545 09/24/15 0405 09/25/15 0527 09/26/15 0327  NA 136 136 137 136  K 3.1* 3.6 3.0* 3.4*  CL 95* 98* 99* 100*  CO2 36* 31 31 29   GLUCOSE 105* 98 105* 103*  BUN 14 8 7 8   CREATININE 0.81 0.81 0.75 0.77  CALCIUM 9.2 8.4* 8.5* 8.4*  MG  --   --  1.4* 1.6*   GFR Estimated Creatinine Clearance: 70.7 mL/min (by C-G formula based on Cr of 0.77). Liver Function Tests:  Recent Labs Lab 09/23/15 1545  AST 22  ALT 16*  ALKPHOS 83  BILITOT 1.0  PROT 5.3*  ALBUMIN 2.4*   No results for input(s): LIPASE, AMYLASE in the last 168 hours. No results for input(s): AMMONIA in the last 168 hours. Coagulation profile  Recent Labs Lab 09/23/15 1545 09/23/15 2140 09/24/15 0405 09/25/15 0527  INR 2.97* 1.39 1.36 1.27    CBC:  Recent Labs Lab 09/23/15 1545 09/24/15 0405 09/25/15 0527  WBC 4.5 4.0 4.4  HGB 10.4* 9.8* 10.4*  HCT 32.7* 31.7* 33.6*  MCV 81.3 83.4 82.8  PLT 305 272 315   Cardiac Enzymes: No results for input(s): CKTOTAL, CKMB, CKMBINDEX, TROPONINI in the last 168 hours. BNP (last 3 results) No results for input(s): PROBNP in the last 8760 hours. CBG:  Recent Labs Lab  09/25/15 1120 09/25/15 1622 09/25/15 2146 09/26/15 0632 09/26/15 1146  GLUCAP 102* 127* 88 86 101*   D-Dimer: No results for input(s): DDIMER in the last 72 hours. Hgb A1c:  Recent Labs  09/23/15 2140  HGBA1C 5.5   Lipid Profile: No results for input(s): CHOL, HDL, LDLCALC, TRIG, CHOLHDL, LDLDIRECT in the last 72 hours. Thyroid function studies: No results for input(s): TSH, T4TOTAL, T3FREE, THYROIDAB in the last 72 hours.  Invalid input(s): FREET3 Anemia work up: No results for input(s): VITAMINB12, FOLATE, FERRITIN, TIBC, IRON, RETICCTPCT in the last 72 hours. Sepsis Labs:  Recent Labs Lab 09/23/15 1545 09/24/15 0405 09/25/15 0527  WBC 4.5 4.0 4.4   Microbiology Recent Results (from the past 240 hour(s))  MRSA PCR Screening     Status: None   Collection Time: 09/23/15  8:29 PM  Result Value Ref Range Status   MRSA by PCR NEGATIVE NEGATIVE  Final    Comment:        The GeneXpert MRSA Assay (FDA approved for NASAL specimens only), is one component of a comprehensive MRSA colonization surveillance program. It is not intended to diagnose MRSA infection nor to guide or monitor treatment for MRSA infections.      Medications:   . amLODipine  5 mg Oral q morning - 10a  . buPROPion  100 mg Oral BID  . diltiazem  240 mg Oral q morning - 10a  . feeding supplement  1 Container Oral TID BM  . insulin aspart  0-15 Units Subcutaneous TID WC  . insulin aspart  0-5 Units Subcutaneous QHS  . metoprolol succinate  25 mg Oral Q breakfast  . tamsulosin  0.4 mg Oral Daily   Continuous Infusions:   Time spent: 25 min   LOS: 3 days   Charlynne Cousins  [facility] Pager [phone]  *Please refer to [email or web address], password [email or web address] to get updated schedule on who will round on this patient, as hospitalists switch teams weekly. If 7PM-7AM, please contact night-coverage at [email or web address], password [email or web address] for any overnight needs.  09/26/2015, 2:03 PM

## 2015-09-26 NOTE — H&P (View-Only) (Signed)
Physical Medicine and Rehabilitation Admission H&P    Chief Complaint  Patient presents with  . Weakness  : HPI: Tommy Weeks is a 74 y.o. 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 Patient lives with spouse who has reported dementia require 24-hour care. He has a Personal care attendant to help his wife. Patient uses a single-point cane prior to admission. 2 level home 3 steps to entry with bedroom first floor. Presented 09/23/2015 with weakness lower extremities and to a lesser extent upper extremities. He does note 3 separate falls over the past 2 weeks hitting his head against a door jam in the bathroom. INR on admission of 2.97. CT of the brain showed a right frontal isodense subacute subdural hematoma without significant mass effect. Patient's Coumadin was reversed. Follow-up neurosurgery Dr. Sherwood Gambler advise conservative care. Latest follow-up cranial CT scan 09/25/2015 with overall appearance smaller left-sided subdural hematoma without midline shift. Tolerating a regular consistency diet. Bouts of hypokalemia 3.0 with potassium supplement added as well as magnesium 1.6. Physical therapy evaluation completed 09/25/2015 with recommendations of physical medicine rehabilitation consult.Patient was admitted for a comprehensive rehab program  ROS Constitutional: Negative for fever and chills.  HENT: Positive for hearing loss.   Headache  Eyes: Negative for blurred vision and double vision.  Respiratory: Negative for cough and shortness of breath.  Cardiovascular: Positive for palpitations. Negative for chest pain and leg swelling.  Gastrointestinal: Positive for blood in stool. Negative for nausea and vomiting.  Genitourinary: Negative for dysuria and hematuria.   Urinary retention    Musculoskeletal: Positive for myalgias and falls.  Skin: Negative for rash.  Neurological: Positive for weakness. Negative for seizures.   Right facial weakness  Psychiatric/Behavioral: Positive for depression.   Anxiety  All other systems reviewed and are negative  Past Medical History  Diagnosis Date  . Hypertension   . 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 Anxiety   . Depression   . 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 Prostate cancer ([place])   . History of urinary retention   . History of prostatitis   . Chronic atrial fibrillation ([place])   . Dysrhythmia   . Type 2 diabetes mellitus (HCC)     NIDDM  . Complication of anesthesia     urinary retention  . Hyperlipidemia   . Neuromuscular disorder ([place])     imbalance s/p craniectomy  . Prostate cancer [facility])    Past Surgical History  Procedure Laterality Date  . Cholecystectomy  90's  . Retroperitoneal mass excision  03/31/2013  . Laparotomy N/A 03/31/2013    Procedure: Open resection of abdominal mass;  Surgeon: Stark Klein, MD;  Location: [place];  Service: General;  Laterality: N/A;  . Cardiac catheterization  1990  . Inguinal hernia repair Bilateral 05/24/2013    Procedure: bilateral open HERNIA REPAIR INGUINAL ADULT;  Surgeon: Gayland Curry, MD;  Location: [facility];  Service: General;  Laterality: Bilateral;  . Insertion of mesh Bilateral 05/24/2013    Procedure: INSERTION OF MESH;  Surgeon: Gayland Curry, MD;  Location: [facility];  Service: General;  Laterality: Bilateral;  . Umbilical hernia  repair  [id number]  . Craniectomy for excision of acoustic neuroma Right 02-12-2001  . Hydrocele excision Right 11-26-2007  . Exploration face right side  11-29-2001  &  02-06-2003    post craniotomy resection acoustic neuroma--  facial paralysis  .  Transthoracic echocardiogram  05-11-2007  (per PCP note)    ef 55-65%,  mild TR and MR  . Eye surgery Bilateral     cataract OU w/ IOL  . Transrectal ultrasound N/A 06/11/2015    Procedure: TRANSRECTAL ULTRASOUND AND BIOPSY;  Surgeon: Kathie Rhodes, MD;  Location[facility];  Service: Urology;  Laterality: N/A;   History reviewed. No pertinent family history. Social History:  reports that he has never smoked. He has never used smokeless tobacco. He reports that he does not drink alcohol or use illicit drugs. Allergies: No Known Allergies Medications Prior to Admission  Medication Sig Dispense Refill  . acetaminophen (TYLENOL) 500 MG tablet Take 500 mg by mouth every 8 (eight) hours as needed for mild pain.    Marland Kitchen amLODipine (NORVASC) 5 MG tablet Take 5 mg by mouth every morning.     Marland Kitchen buPROPion (WELLBUTRIN) 100 MG tablet Take 100 mg by mouth 2 (two) times daily.     Marland Kitchen diltiazem (CARDIZEM CD) 240 MG 24 hr capsule Take 240 mg by mouth every morning.     . feeding supplement, ENSURE ENLIVE, (ENSURE ENLIVE) LIQD Take 237 mLs by mouth 2 (two) times daily between meals. (Patient taking differently: Take 237 mLs by mouth daily. ) 237 mL 12  . fenofibrate (TRICOR) 145 MG tablet Take 145 mg by mouth at bedtime.     . fish oil-omega-3 fatty acids 1000 MG capsule Take 1 g by mouth 2 (two) times daily.     Marland Kitchen LORazepam (ATIVAN) 0.5 MG tablet Take 0.5 mg by mouth 2 (two) times daily.    . metFORMIN (GLUCOPHAGE) 500 MG tablet Take 1,000 mg by mouth daily before supper.    . metoprolol succinate (TOPROL-XL) 100 MG 24 hr tablet Take 25 mg by mouth daily. Take with or immediately following a meal.    . tamsulosin (FLOMAX) 0.4 MG CAPS capsule Take 1 capsule (0.4 mg total) by mouth daily. (Patient taking differently: Take 0.8 mg by mouth at bedtime. ) 7 capsule 0  . warfarin (COUMADIN) 5 MG tablet Take 5-7.5 mg by mouth daily. Alternate daily between 4m to 7.567m    . rosuvastatin (CRESTOR) 5 MG  tablet Take 5 mg by mouth every Monday. Monday      Home: Home Living Family/patient expects to be discharged to:: Private residence Living Arrangements: Spouse/significant other (spouse has dementia; requires 24/7 due to cognition) Available Help at Discharge: Personal care attendant, Other (Comment) (paying friend to help ) Type of Home: House Home Access: Stairs to enter [employer] Steps: 3 Entrance Stairs-Rails: None Home Layout: Able to live on main level with bedroom/bathroom Bathroom Shower/Tub: WaHoliday representativeccessibility: Yes Home Equipment: WaEnvironmental consultant 2 wheels, [employer] single point, Shower seat, Bedside commode   Functional History: Prior Function Level of Independence: Independent with assistive device(s) Comments: recent falls began due to imbalance after Oct 16 acoustic neuroma surgery; began using RW and has not fallen when using RW  Functional Status:  Mobility: Bed Mobility Overal bed mobility: Modified Independent General bed mobility comments: pt requires incr time in sitting to allow imbalance sensation to subside Transfers Overall transfer level: Needs assistance Equipment used: Rolling walker (2 wheeled) Transfers:  Sit to/from Stand Sit to Stand: Min assist General transfer comment: x3; unsafe use of RW with tipping; uncontrolled descents Ambulation/Gait Ambulation/Gait assistance: Min assist Ambulation Distance (Feet): 70 Feet Assistive device: Rolling walker (2 wheeled) Gait Pattern/deviations: Step-through pattern, Decreased stride length, Ataxic General Gait Details: limited by fatigue of legs Gait velocity: encouraged to decr especially with turns or head turns to minimize dizziness/imbalance Gait velocity interpretation: Below normal speed for age/gender    ADL: ADL Overall ADL's : Needs assistance/impaired Eating/Feeding: Set up, Sitting Grooming: Minimal assistance, Wash/dry hands, Standing Grooming Details (indicate  cue type and reason): fatigues easily, min A for balance Upper Body Bathing: Sitting, Minimal assitance Lower Body Bathing: Moderate assistance, Sit to/from stand Upper Body Dressing : Minimal assistance, Sitting Lower Body Dressing: Moderate assistance, Sit to/from stand Toilet Transfer: Moderate assistance, Ambulation, RW, Grab bars (3n1 over toilet) Toileting- Clothing Manipulation and Hygiene: Moderate assistance, Sit to/from stand Tub/ Shower Transfer: Moderate assistance, Ambulation, 3 in 1, Rolling walker Functional mobility during ADLs: Moderate assistance, Rolling walker General ADL Comments: Pt completed in-room functional mobility, toilet transfer, and grooming task as detailed below. Pt fatigues easily. Pt initially completing practice of ambulating to bathroom for toilet transfer. Upon returning to EOB pt reporting urgent need to toilet. Pt with decreased safety awareness, increased speed and decreased balance to return to toilet. Pt with LOB in bathroom requiring mod A to prevent a fall. Pt cued to slow down and focus on his balance. Pt with some urinary incontinence.   Cognition: Cognition Overall Cognitive Status: Within Functional Limits for tasks assessed Orientation Level: Oriented X4 Cognition Arousal/Alertness: Awake/alert Behavior During Therapy: WFL for tasks assessed/performed Overall Cognitive Status: Within Functional Limits for tasks assessed Memory: Decreased recall of precautions  Physical Exam: Blood pressure 134/88, pulse 120, temperature 99.7 F (37.6 C), temperature source Oral, resp. rate 17, height 6' (1.829 m), weight 63.594 kg (140 lb 3.2 oz), SpO2 99 %. Physical Exam Constitutional: He is oriented to person, place, and time.  HENT:  Normocephalic.  Atraumatic Eyes: EOM and Conj are normal.  Neck: Normal range of motion. Neck supple. No thyromegaly present.  Cardiovascular: Irregular irregular  Respiratory: Effort normal and breath sounds normal. No  respiratory distress.  GI: Soft. Bowel sounds are normal. He exhibits no distension.  Neurological: He is alert and oriented to person, place, and time. He exhibits normal muscle tone.  Follows full commands.  Good insight and awareness.  Right facial weakness. No hearing on right.  B/l UE: 4/5 proximal to distal B/l LE: 3/5 Hip flexion, 4/5 knee extension, ankle dorsi/plantar flexion Decreased sensation to light touch distal LE  3+ DTR RLE Skin: Skin is warm and dry.  Psychiatric: He has a normal mood and affect. His behavior is normal  Results for orders placed or performed during the hospital encounter of 09/23/15 (from the past 48 hour(s))  Glucose, capillary     Status: Abnormal   Collection Time: 09/24/15  3:44 PM  Result Value Ref Range   Glucose-Capillary 135 (H) 65 - 99 mg/dL  Glucose, capillary     Status: Abnormal   Collection Time: 09/24/15  8:27 PM  Result Value Ref Range   Glucose-Capillary 133 (H) 65 - 99 mg/dL   Comment 1 Notify RN    Comment 2 Document in Chart   Protime-INR     Status: Abnormal   Collection Time: 09/25/15  5:27 AM  Result Value Ref Range   Prothrombin Time 16.1 (H) 11.6 - 15.2  seconds   INR 1.27 0.00 - 7.12  Basic metabolic panel     Status: Abnormal   Collection Time: 09/25/15  5:27 AM  Result Value Ref Range   Sodium 137 135 - 145 mmol/L   Potassium 3.0 (L) 3.5 - 5.1 mmol/L   Chloride 99 (L) 101 - 111 mmol/L   CO2 31 22 - 32 mmol/L   Glucose, Bld 105 (H) 65 - 99 mg/dL   BUN 7 6 - 20 mg/dL   Creatinine, Ser 0.75 0.61 - 1.24 mg/dL   Calcium 8.5 (L) 8.9 - 10.3 mg/dL   GFR calc non Af Amer >60 >60 mL/min   GFR calc Af Amer >60 >60 mL/min    Comment: (NOTE) The eGFR has been calculated using the CKD EPI equation. This calculation has not been validated in all clinical situations. eGFR's persistently <60 mL/min signify possible Chronic Kidney Disease.    Anion gap 7 5 - 15  CBC     Status: Abnormal   Collection Time: 09/25/15  5:27 AM    Result Value Ref Range   WBC 4.4 4.0 - 10.5 K/uL   RBC 4.06 (L) 4.22 - 5.81 MIL/uL   Hemoglobin 10.4 (L) 13.0 - 17.0 g/dL   HCT 33.6 (L) 39.0 - 52.0 %   MCV 82.8 78.0 - 100.0 fL   MCH 25.6 (L) 26.0 - 34.0 pg   MCHC 31.0 30.0 - 36.0 g/dL   RDW 15.5 11.5 - 15.5 %   Platelets 315 150 - 400 K/uL  Magnesium     Status: Abnormal   Collection Time: 09/25/15  5:27 AM  Result Value Ref Range   Magnesium 1.4 (L) 1.7 - 2.4 mg/dL  Glucose, capillary     Status: Abnormal   Collection Time: 09/25/15  6:31 AM  Result Value Ref Range   Glucose-Capillary 101 (H) 65 - 99 mg/dL   Comment 1 Notify RN    Comment 2 Document in Chart   Glucose, capillary     Status: Abnormal   Collection Time: 09/25/15 11:20 AM  Result Value Ref Range   Glucose-Capillary 102 (H) 65 - 99 mg/dL   Comment 1 Notify RN    Comment 2 Document in Chart   Glucose, capillary     Status: Abnormal   Collection Time: 09/25/15  4:22 PM  Result Value Ref Range   Glucose-Capillary 127 (H) 65 - 99 mg/dL   Comment 1 Notify RN    Comment 2 Document in Chart   Glucose, capillary     Status: None   Collection Time: 09/25/15  9:46 PM  Result Value Ref Range   Glucose-Capillary 88 65 - 99 mg/dL   Comment 1 Notify RN    Comment 2 Document in Chart   Basic metabolic panel     Status: Abnormal   Collection Time: 09/26/15  3:27 AM  Result Value Ref Range   Sodium 136 135 - 145 mmol/L   Potassium 3.4 (L) 3.5 - 5.1 mmol/L   Chloride 100 (L) 101 - 111 mmol/L   CO2 29 22 - 32 mmol/L   Glucose, Bld 103 (H) 65 - 99 mg/dL   BUN 8 6 - 20 mg/dL   Creatinine, Ser 0.77 0.61 - 1.24 mg/dL   Calcium 8.4 (L) 8.9 - 10.3 mg/dL   GFR calc non Af Amer >60 >60 mL/min   GFR calc Af Amer >60 >60 mL/min    Comment: (NOTE) The eGFR has been calculated using the CKD EPI  equation. This calculation has not been validated in all clinical situations. eGFR's persistently <60 mL/min signify possible Chronic Kidney Disease.    Anion gap 7 5 - 15   Magnesium     Status: Abnormal   Collection Time: 09/26/15  3:27 AM  Result Value Ref Range   Magnesium 1.6 (L) 1.7 - 2.4 mg/dL  Glucose, capillary     Status: None   Collection Time: 09/26/15  6:32 AM  Result Value Ref Range   Glucose-Capillary 86 65 - 99 mg/dL   Comment 1 Notify RN    Comment 2 Document in Chart   Glucose, capillary     Status: Abnormal   Collection Time: 09/26/15 11:46 AM  Result Value Ref Range   Glucose-Capillary 101 (H) 65 - 99 mg/dL   Comment 1 Notify RN    Comment 2 Document in Chart    Ct Head Wo Contrast  09/25/2015  CLINICAL DATA:  74 year old diabetic hypertensive male with atrial fibrillation on anticoagulation with subdural collection. Prior surgery for vestibular schwannoma. Recurrence. Subsequent encounter. EXAM: CT HEAD WITHOUT CONTRAST TECHNIQUE: Contiguous axial images were obtained from the base of the skull through the vertex without intravenous contrast. COMPARISON:  09/23/2015 and 06/28/2007 head CT. 08/07/2015 brain MR. FINDINGS: Right sided subdural broad-based collection with maximal thickness of 7.7 mm without significant change. Local mass effect without midline shift. Left-sided extra-axial collection may represent prominent subarachnoid spaces but appears slightly different than recent MR and remote CT and may represent isodense left subdural collection with maximal thickness of 4.4 mm. Remote right occipital craniotomy for resection of vestibular schwannoma with recurrence as noted on recent MR. Encephalomalacia along the operative tract unchanged. No CT evidence of large acute infarct. Baseline atrophy. Vascular calcifications. Remote right nasal bone surgery with screw in place. IMPRESSION: Overall similar appearance to the recent CT with broad-based right-sided and possibly smaller left-sided subdural hematoma with local mass effect without midline shift. Postoperative changes right posterior fossa with recurrent right-sided vestibular schwannoma  better delineated on recent MR. Electronically Signed   By: Genia Del M.D.   On: 09/25/2015 07:35       Medical Problem List and Plan: 1.  Weakness secondary to SDH after fall,Hx right acoustic neuroma 2.  DVT Prophylaxis/Anticoagulation: SCD 3. Pain Management: Tylenol as needed 4. Mood: Ativan 0.5 mg every 6 hours as needed, Wellbutrin 100 mg twice a day, 5. Neuropsych: This patient is capable of making decisions on his own behalf. 6. Skin/Wound Care: Routine skin checks 7. Fluids/Electrolytes/Nutrition: Routine I&O with follow-up chemistries 8. History of atrial fibrillation. Chronic Coumadin discontinued secondary to subdural hematoma. Continue Cardizem 240 mg daily. Cardiac rate control 9. Hypertension. Toprol 25 mg daily, Norvasc 5 mg daily. 10. Diabetes mellitus with peripheral neuropathy. Hemoglobin A1c 5.5. Continue sliding scale. Check blood sugars before meals and at bedtime 11. Hypokalemia. Follow-up chemistries 12. BPH. Flomax 0.4 mg daily. Check PVRs 3  Post Admission Physician Evaluation: 1. Functional deficits secondary  to SDH after fall,Hx right acoustic neuroma. 2. Patient is admitted to receive collaborative, interdisciplinary care between the physiatrist, rehab nursing staff, and therapy team. 3. Patient's level of medical complexity and substantial therapy needs in context of that medical necessity cannot be provided at a lesser intensity of care such as a SNF. 4. Patient has experienced substantial functional loss from his/her baseline which was documented above under the "Functional History" and "Functional Status" headings.  Judging by the patient's diagnosis, physical exam, and functional history, the patient  has potential for functional progress which will result in measurable gains while on inpatient rehab.  These gains will be of substantial and practical use upon discharge  in facilitating mobility and self-care at the household level. 5. Physiatrist will  provide 24 hour management of medical needs as well as oversight of the therapy plan/treatment and provide guidance as appropriate regarding the interaction of the two. 6. 24 hour rehab nursing will assist with bladder management, bowel management, safety, skin/wound care, disease management, medication administration, pain management and patient education and help integrate therapy concepts, techniques,education, etc. 7. PT will assess and treat for/with: Lower extremity strength, range of motion, stamina, balance, functional mobility, safety, adaptive techniques and equipment, woundcare, coping skills, pain control, stroke education.   Goals are: Mod I. 8. OT will assess and treat for/with: ADL's, functional mobility, safety, upper extremity strength, adaptive techniques and equipment, wound mgt, ego support, and community reintegration.   Goals are: Mod I. Therapy may proceed with showering this patient. 9. Case Management and Social Worker will assess and treat for psychological issues and discharge planning. 10. Team conference will be held weekly to assess progress toward goals and to determine barriers to discharge. 11. Patient will receive at least 3 hours of therapy per day at least 5 days per week. 12. ELOS: 7-10 days.        13. Prognosis:  good  Delice Lesch, MD  09/26/2015

## 2015-09-26 NOTE — Interval H&P Note (Signed)
Tommy Weeks was admitted today to Inpatient Rehabilitation with the diagnosis of SDH after fall,Hx right acoustic neuroma.  The patient's history has been reviewed, patient examined, and there is no change in status.  Patient continues to be appropriate for intensive inpatient rehabilitation.  I have reviewed the patient's chart and labs.  Questions were answered to the patient's satisfaction. The PAPE has been reviewed and assessment remains appropriate.  Tommy Weeks Tommy Weeks 09/26/2015, 5:50 PM

## 2015-09-26 NOTE — Discharge Summary (Signed)
Physician Discharge Summary  Patient ID: Tommy Weeks MRN: [id number] DOB/AGE: 76-21-1942 76 y.o.  Admit date: 09/23/2015 Discharge date: 09/26/2015  Admission Diagnoses:Subdural hematoma  Discharge Diagnoses:  Active Problems:   Warfarin-induced coagulopathy (HCC)   SDH (subdural hematoma) (HCC)   Weakness   Discharged Condition: good  Hospital Course: Mr. Jaskolski was admitted to the hospital after falling and striking his head approximately 3-4 days prior to admission. He has had a number of falls in the last 4-6 weeks. Head CT revealed a small right frontal-temporal-parietal subdural hematoma not causing mass effect nor necessitating surgery. His  Neurologic exam is unchanged. He has a right facial droop, and loss of hearing secondary to an acoustic neuroma. This was resected in 2002. He had a recent identified recurrence which we have treated with stereotactic radiation  Mr. Bechen underwent a repeat ct scan which showed no change. He is medically stable for transfer today.   Treatments: IV hydration  Discharge Exam: Blood pressure 117/71, pulse 109, temperature 98.2 F (36.8 C), temperature source Oral, resp. rate 19, height 6' (1.829 m), weight 63.594 kg (140 lb 3.2 oz), SpO2 97 %. Alert and oriented, moving all extremities. Right facial droop complete, perrl, full eom. Tongue midline. Stable exam.   Disposition: 01-Home or Self Care * No surgery found *    Medication List    STOP taking these medications        warfarin 5 MG tablet  Commonly known as:  COUMADIN      TAKE these medications        acetaminophen 500 MG tablet  Commonly known as:  TYLENOL  Take 500 mg by mouth every 8 (eight) hours as needed for mild pain.     amLODipine 5 MG tablet  Commonly known as:  NORVASC  Take 5 mg by mouth every morning.     buPROPion 100 MG tablet  Commonly known as:  WELLBUTRIN  Take 100 mg by mouth 2 (two) times daily.     diltiazem 240 MG 24 hr capsule   Commonly known as:  CARDIZEM CD  Take 240 mg by mouth every morning.     feeding supplement (ENSURE ENLIVE) Liqd  Take 237 mLs by mouth 2 (two) times daily between meals.     fenofibrate 145 MG tablet  Commonly known as:  TRICOR  Take 145 mg by mouth at bedtime.     fish oil-omega-3 fatty acids 1000 MG capsule  Take 1 g by mouth 2 (two) times daily.     LORazepam 0.5 MG tablet  Commonly known as:  ATIVAN  Take 0.5 mg by mouth 2 (two) times daily.     metFORMIN 500 MG tablet  Commonly known as:  GLUCOPHAGE  Take 1,000 mg by mouth daily before supper.     metoprolol succinate 100 MG 24 hr tablet  Commonly known as:  TOPROL-XL  Take 25 mg by mouth daily. Take with or immediately following a meal.     rosuvastatin 5 MG tablet  Commonly known as:  CRESTOR  Take 5 mg by mouth every Monday. Monday     tamsulosin 0.4 MG Caps capsule  Commonly known as:  FLOMAX  Take 1 capsule (0.4 mg total) by mouth daily.           Follow-up Information    Follow up with Hollee Fate L, MD In 2 weeks.   Specialty:  Neurosurgery   Why:  call office to make an appointment   Contact information:  [address] [place] [address] [phone]       Signed: Winfield Cunas 09/26/2015, 12:26 PM

## 2015-09-26 NOTE — Care Management Important Message (Signed)
Important Message  Patient Details  Name: Tommy Weeks MRN: [id number] Date of Birth: [birth year]   Medicare Important Message Given:  Yes    Barb Merino [place] 09/26/2015, 10:56 AM

## 2015-09-26 NOTE — PMR Pre-admission (Signed)
PMR Admission Coordinator Pre-Admission Assessment  Patient: Tommy Weeks is an 74 y.o., male MRN: [id number] DOB: [birth year] Height: 6' (182.9 cm) Weight: 63.594 kg (140 lb 3.2 oz)              Insurance Information HMO: No    PPO:       PCP:       IPA:       80/20:       OTHER:   PRIMARY:  Medicare A/B      Policy#: [id number]      Subscriber: [name] CM Name:        Phone#:       Fax#:   Pre-Cert#:        Employer: Retired Benefits:  Phone #:       Name: Checked in [place]. Date: 07/20/04     Deduct: $1288      Out of Pocket Max: none      Life Max: unlimited CIR: 100%      SNF: 100 days Outpatient: 80%     Co-Pay: 20% Home Health: 100%      Co-Pay: none DME: 80%     Co-Pay: 20% Providers: patient's choice  SECONDARY: BCBS federal emp PPO      Policy#: [id number]      Subscriber: [name] CM Name:        Phone#:       Fax#:   Pre-Cert#:        Employer: Retired Benefits:  Phone #: [phone]     Name:   Eff. Date:       Deduct:        Out of Pocket Max:        Life Max:   CIR:        SNF:   Outpatient:       Co-Pay:   Home Health:        Co-Pay:   DME:       Co-Pay:    Emergency Contact Information Contact Information    Name Relation Home Work Mobile   Stuart Spouse [phone]     [name] [phone]     Valeria, Benezra [phone]       Current Medical History  Patient Admitting Diagnosis: Traumatic R SDH after fall, hx of right acoustic neuroma   History of Present Illness: A 74 y.o. 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 Patient lives with spouse who has reported dementia require 24-hour care. He has a Personal care attendant to help his wife. Patient uses a single-point cane prior to admission. 2 level home 3 steps to entry with bedroom first  floor. Presented 09/23/2015 with weakness lower extremities and to a lesser extent upper extremities. He does note 3 separate falls over the past 2 weeks hitting his head against a door jam in the bathroom. INR on admission of 2.97. CT of the brain showed a right frontal isodense subacute subdural hematoma without significant mass effect. Patient's Coumadin was reversed. Follow-up neurosurgery Dr. Sherwood Gambler advise conservative care. Latest follow-up cranial CT scan 09/25/2015 with overall appearance smaller left-sided subdural hematoma without midline shift. Tolerating a regular consistency diet. Bouts of hypokalemia 3.0 with potassium supplement added. Physical therapy evaluation completed 09/25/2015 with recommendations of physical medicine rehabilitation consult. Patient to be admitted for a comprehensive inpatient rehab program.  Past Medical History  Past Medical History  Diagnosis Date  . Hypertension   . 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 Anxiety   . Depression   . 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 Prostate cancer ([place])   . History of urinary retention   . History of prostatitis   . Chronic atrial fibrillation ([place])   . Dysrhythmia   . Type 2 diabetes mellitus (HCC)     NIDDM  . Complication of anesthesia     urinary retention  . Hyperlipidemia   . Neuromuscular disorder ([place])     imbalance s/p craniectomy  . Prostate cancer [facility])     Family History  family history is not on file.  Prior Rehab/Hospitalizations: No previous rehab admissions.  Has the patient had major surgery during 100 days prior to admission? No  Current Medications   Current facility-administered medications:  .  0.9 %  sodium chloride infusion, 250 mL, Intravenous, PRN, Marijean Heath, NP .  acetaminophen  (TYLENOL) tablet 650 mg, 650 mg, Oral, Q4H PRN, Marijean Heath, NP, 650 mg at 09/25/15 2151 .  amLODipine (NORVASC) tablet 5 mg, 5 mg, Oral, q morning - 10a, Marijean Heath, NP, 5 mg at 09/26/15 1346 .  buPROPion [facility]) tablet 100 mg, 100 mg, Oral, BID, Marijean Heath, NP, 100 mg at 09/26/15 1346 .  diltiazem (CARDIZEM CD) 24 hr capsule 240 mg, 240 mg, Oral, q morning - 10a, Marijean Heath, NP, 240 mg at 09/26/15 1345 .  feeding supplement (BOOST / RESOURCE BREEZE) liquid 1 Container, 1 Container, Oral, TID BM, Rush Farmer, MD, 1 Container at 09/25/15 1119 .  insulin aspart (novoLOG) injection 0-15 Units, 0-15 Units, Subcutaneous, TID WC, Chesley Mires, MD, 2 Units at 09/25/15 1713 .  insulin aspart (novoLOG) injection 0-5 Units, 0-5 Units, Subcutaneous, QHS, Chesley Mires, MD, 0 Units at 09/24/15 2153 .  LORazepam (ATIVAN) tablet 0.5 mg, 0.5 mg, Oral, Q6H PRN, Marijean Heath, NP .  metoprolol succinate (TOPROL-XL) 24 hr tablet 25 mg, 25 mg, Oral, Q breakfast, Marijean Heath, NP, Stopped at 09/26/15 [phone] .  ondansetron (ZOFRAN) injection 4 mg, 4 mg, Intravenous, Q6H PRN, Marijean Heath, NP .  tamsulosin (FLOMAX) capsule 0.4 mg, 0.4 mg, Oral, Daily, Marijean Heath, NP, 0.4 mg at 09/26/15 1345  Patients Current Diet: Diet Carb Modified Fluid consistency:: Thin; Room service appropriate?: Yes  Precautions / Restrictions Precautions Precautions: Fall Restrictions Weight Bearing Restrictions: No   Has the patient had 2 or more falls or a fall with injury in the past year?Yes.  Has had 3 falls and the most recent fall resulted in injury.  Prior Activity Level Community (5-7x/wk): Went out 4 X a week.  Was driving.  He is the caregiver for his wife who has dementia.  Home Assistive Devices / Equipment Home Assistive Devices/Equipment: Environmental consultant (specify type), Eyeglasses Home Equipment: Walker - 2 wheels, Cane - single point, Shower seat, Bedside  commode  Prior Device Use: Indicate devices/aids used by the patient prior to current illness, exacerbation or injury? Straight Cane  Prior Functional Level Prior Function Level of Independence: Independent with assistive device(s) Comments: recent falls began due to imbalance after Oct 16 acoustic neuroma surgery; began using RW  and has not fallen when using RW  Self Care: Did the patient need help bathing, dressing, using the toilet or eating?  Independent  Indoor Mobility: Did the patient need assistance with walking from room to room (with or without device)? Independent  Stairs: Did the patient need assistance with internal or external stairs (with or without device)? Independent  Functional Cognition: Did the patient need help planning regular tasks such as shopping or remembering to take medications? Independent  Current Functional Level Cognition  Overall Cognitive Status: Within Functional Limits for tasks assessed Orientation Level: Oriented X4    Extremity Assessment (includes Sensation/Coordination)  Upper Extremity Assessment: Overall WFL for tasks assessed, Generalized weakness, LUE deficits/detail LUE Deficits / Details: tremulous with gross LUE MMT  Lower Extremity Assessment: Defer to PT evaluation RLE Deficits / Details: 5/5 except dorsiflexion 4+/5 RLE Sensation: decreased light touch (?peripheral neuropathy undiagnosed) RLE Coordination: decreased gross motor (poor heel to shin; ataxic) LLE Deficits / Details: hip flexion 3/5, knee extension 3+, ankle dorsiflexion 3+ LLE Sensation: decreased light touch (?peripheral neuropathy undiagnosed) LLE Coordination:  (heel to shin normal)    ADLs  Overall ADL's : Needs assistance/impaired Eating/Feeding: Set up, Sitting Grooming: Minimal assistance, Wash/dry hands, Standing Grooming Details (indicate cue type and reason): fatigues easily, min A for balance Upper Body Bathing: Sitting, Minimal assitance Lower Body  Bathing: Moderate assistance, Sit to/from stand Upper Body Dressing : Minimal assistance, Sitting Lower Body Dressing: Moderate assistance, Sit to/from stand Toilet Transfer: Moderate assistance, Ambulation, RW, Grab bars (3n1 over toilet) Toileting- Clothing Manipulation and Hygiene: Moderate assistance, Sit to/from stand Tub/ Shower Transfer: Moderate assistance, Ambulation, 3 in 1, Rolling walker Functional mobility during ADLs: Moderate assistance, Rolling walker General ADL Comments: Pt completed in-room functional mobility, toilet transfer, and grooming task as detailed below. Pt fatigues easily. Pt initially completing practice of ambulating to bathroom for toilet transfer. Upon returning to EOB pt reporting urgent need to toilet. Pt with decreased safety awareness, increased speed and decreased balance to return to toilet. Pt with LOB in bathroom requiring mod A to prevent a fall. Pt cued to slow down and focus on his balance. Pt with some urinary incontinence.     Mobility  Overal bed mobility: Modified Independent General bed mobility comments: pt requires incr time in sitting to allow imbalance sensation to subside    Transfers  Overall transfer level: Needs assistance Equipment used: Rolling walker (2 wheeled) Transfers: Sit to/from Stand Sit to Stand: Min assist General transfer comment: x3; unsafe use of RW with tipping; uncontrolled descents    Ambulation / Gait / Stairs / Wheelchair Mobility  Ambulation/Gait Ambulation/Gait assistance: Museum/gallery curator (Feet): 70 Feet Assistive device: Rolling walker (2 wheeled) Gait Pattern/deviations: Step-through pattern, Decreased stride length, Ataxic General Gait Details: limited by fatigue of legs Gait velocity: encouraged to decr especially with turns or head turns to minimize dizziness/imbalance Gait velocity interpretation: Below normal speed for age/gender    Posture / Balance Static Standing Balance [place] -  Eyes Opened: 30 (no UE support, minguard) [place] - Eyes Closed: 10 (no UE support, minguard; >normal sway) Balance Overall balance assessment: Needs assistance Sitting-balance support: No upper extremity supported, Feet supported Sitting balance-Leahy Scale: Fair Standing balance support: Bilateral upper extremity supported, During functional activity Standing balance-Leahy Scale: Poor Standing balance comment: min A for steading [place] - Eyes Opened: 30 (no UE support, minguard) [place] - Eyes Closed: 10 (no UE support, minguard; >normal sway) High level balance activites: Backward walking, Direction  changes, Turns, Head turns High Level Balance Comments: pt able to carryover instructions to turn slowly from 12/6, however continues to turn head abruptly in standing, triggering vestibular symptoms    Special needs/care consideration BiPAP/CPAP No CPM No Continuous Drip IV 0.9% NS at 50 ml/hr Dialysis No        Life Vest No Oxygen No Special Bed No Trach Size No Wound Vac (area) No      Skin No                          Bowel mgmt: Last documented BM 09/23/15 but patient reports BM 09/25/15 Bladder mgmt: Voiding in urinal and in bathroom with assistance Diabetic mgmt Yes, on oral medications at home.    Previous Home Environment Living Arrangements: Spouse/significant other (spouse has dementia; requires 24/7 due to cognition) Available Help at Discharge: Personal care attendant, Other (Comment) (paying friend to help ) Type of Home: House Home Layout: Able to live on main level with bedroom/bathroom Home Access: Stairs to enter Entrance Stairs-Rails: None Entrance Stairs-Number of Steps: 3 Bathroom Shower/Tub: Art gallery manager: Yes [place]: No  Discharge Living Setting Plans for Discharge Living Setting: Patient's home, House, Lives with (comment) (Lives with wife.) Type of Home at Discharge: House Discharge Home Layout: Two level, Able  to live on main level with bedroom/bathroom Alternate Level Stairs-Number of Steps: Flight Discharge Home Access: Stairs to enter [employer] of Steps: 2 steps at front and 3 steps at garage entry. Does the patient have any problems obtaining your medications?: No  Social/Family/Support Systems Patient Roles: Spouse, Parent (Wife with dementia.  Has 3 sons.) Contact Information: Taelin Scatena - son [phone] Anticipated Caregiver: Son, sister, and hired caregiver Ability/Limitations of Caregiver: Sister works, son works, has a hired caregiver for his wife and she will be there for patient as well. Caregiver Availability: Other (Comment) (Will have someone in the home most of the time.) Discharge Plan Discussed with Primary Caregiver: Yes Is Caregiver In Agreement with Plan?: Yes Does Caregiver/Family have Issues with Lodging/Transportation while Pt is in Rehab?: No  Goals/Additional Needs Patient/Family Goal for Rehab: PT/OT mod I goals Expected length of stay: 7 days Cultural Considerations: None Dietary Needs: Carb mod med cal, thin liquids Equipment Needs: TBD Pt/Family Agrees to Admission and willing to participate: Yes Program Orientation Provided & Reviewed with Pt/Caregiver Including Roles  & Responsibilities: Yes  Decrease burden of Care through IP rehab admission: N/A  Possible need for SNF placement upon discharge: Not planned  Patient Condition: This patient's condition remains as documented in the consult dated 09/25/15, in which the Rehabilitation Physician determined and documented that the patient's condition is appropriate for intensive rehabilitative care in an inpatient rehabilitation facility. Will admit to inpatient rehab today.  Preadmission Screen Completed By:  Retta Diones, 09/26/2015 3:29 PM ______________________________________________________________________   Discussed status with Dr. Posey Pronto on 09/26/15 at 1529 and received telephone  approval for admission today.  Admission Coordinator:  Retta Diones, time1529/Date12/07/16

## 2015-09-26 NOTE — Progress Notes (Signed)
Meredith Staggers, MD Physician Signed Physical Medicine and Rehabilitation Consult Note 09/25/2015 1:28 PM  Related encounter: ED to Hosp-Admission (Current) from 09/23/2015 in [place] Collapse All        Physical Medicine and Rehabilitation Consult Reason for Consult: Traumatic Right frontal subacute subdural hematoma Referring Physician: [facility]   HPI: UNIQUE DARTY is a 74 y.o. 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 Patient lives with spouse who has reported dementia require 24-hour care. He has a Personal care attendant to help his wife. Patient uses a single-point cane prior to admission. 2 level home 3 steps to entry with bedroom first floor. Presented 09/23/2015 with weakness lower extremities and to a lesser extent upper extremities. He does note 3 separate falls over the past 2 weeks hitting his head against a door jam in the bathroom. INR on admission of 2.97. CT of the brain showed a right frontal isodense subacute subdural hematoma without significant mass effect. Patient's Coumadin was reversed. Follow-up neurosurgery Dr. Sherwood Gambler advise conservative care. Latest follow-up cranial CT scan 09/25/2015 with overall appearance smaller left-sided subdural hematoma without midline shift. Tolerating a regular consistency diet. Physical therapy evaluation completed 09/25/2015 with recommendations of physical medicine rehabilitation consult.   Review of Systems  Constitutional: Negative for fever and chills.  HENT: Positive for hearing loss.   Occasional headache  Eyes: Negative for blurred vision and double vision.  Respiratory: Negative for cough and shortness of breath.  Cardiovascular: Positive for palpitations. Negative for chest pain and leg  swelling.  Gastrointestinal: Positive for blood in stool. Negative for nausea and vomiting.  Genitourinary: Negative for dysuria and hematuria.   Urinary retention  Musculoskeletal: Positive for myalgias and falls.  Skin: Negative for rash.  Neurological: Positive for weakness. Negative for seizures.   Right facial weakness  Psychiatric/Behavioral: Positive for depression.   Anxiety  All other systems reviewed and are negative.  Past Medical History  Diagnosis Date  . Hypertension   . 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 Anxiety   . Depression   . History of acoustic neurofibromatosis     S/P RESECTION 02-12-2001 RESIDUAL RIGHT SIDE FACE PARALYSIS AND DEAFNESS RIGHT EAR  . 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 Prostate cancer ([place])   . History of urinary retention   . History of prostatitis   . Chronic atrial fibrillation ([place])   . Dysrhythmia   . Type 2 diabetes mellitus (H[facility])     NIDDM  . Complication of anesthesia     urinary retention  . Hyperlipidemia   . Neuromuscular disorder ([place])     imbalance s/p craniectomy  . Prostate cancer [facility])    Past Surgical History  Procedure Laterality Date  . Cholecystectomy  90's  . Retroperitoneal mass excision  03/31/2013  . Laparotomy N/A 03/31/2013    Procedure: Open resection of abdominal mass; Surgeon: Stark Klein, MD; Location: [place]; Service: General; Laterality: N/A;  . Cardiac catheterization  1990  . Inguinal hernia repair Bilateral 05/24/2013    Procedure: bilateral open HERNIA REPAIR INGUINAL ADULT; Surgeon: Gayland Curry, MD; Location: [facility]; Service: General; Laterality: Bilateral;  . Insertion of mesh Bilateral 05/24/2013    Procedure: INSERTION OF MESH; Surgeon: Leighton Ruff  Redmond Pulling, MD; Location: [facility]; Service: General; Laterality: Bilateral;    . Umbilical hernia repair  1990's  . Craniectomy for excision of acoustic neuroma Right 02-12-2001  . Hydrocele excision Right 11-26-2007  . Exploration face right side  11-29-2001 & 02-06-2003    post craniotomy resection acoustic neuroma-- facial paralysis  . Transthoracic echocardiogram  05-11-2007 (per PCP note)    ef 55-65%, mild TR and MR  . Eye surgery Bilateral     cataract OU w/ IOL  . Transrectal ultrasound N/A 06/11/2015    Procedure: TRANSRECTAL ULTRASOUND AND BIOPSY; Surgeon: Kathie Rhodes, MD; Location: [facility]; Service: Urology; Laterality: N/A;   History reviewed. No pertinent family history. Social History:  reports that he has never smoked. He has never used smokeless tobacco. He reports that he does not drink alcohol or use illicit drugs. Allergies: No Known Allergies Medications Prior to Admission  Medication Sig Dispense Refill  . acetaminophen (TYLENOL) 500 MG tablet Take 500 mg by mouth every 8 (eight) hours as needed for mild pain.    Marland Kitchen amLODipine (NORVASC) 5 MG tablet Take 5 mg by mouth every morning.     Marland Kitchen buPROPion (WELLBUTRIN) 100 MG tablet Take 100 mg by mouth 2 (two) times daily.     Marland Kitchen diltiazem (CARDIZEM CD) 240 MG 24 hr capsule Take 240 mg by mouth every morning.     . feeding supplement, ENSURE ENLIVE, (ENSURE ENLIVE) LIQD Take 237 mLs by mouth 2 (two) times daily between meals. (Patient taking differently: Take 237 mLs by mouth daily. ) 237 mL 12  . fenofibrate (TRICOR) 145 MG tablet Take 145 mg by mouth at bedtime.     . fish oil-omega-3 fatty acids 1000 MG capsule Take 1 g by mouth 2 (two) times daily.     Marland Kitchen LORazepam (ATIVAN) 0.5 MG tablet Take 0.5 mg by mouth 2 (two) times daily.    . metFORMIN (GLUCOPHAGE) 500 MG tablet Take 1,000 mg by mouth daily before supper.    . metoprolol succinate (TOPROL-XL) 100 MG 24 hr tablet Take 25 mg by  mouth daily. Take with or immediately following a meal.    . tamsulosin (FLOMAX) 0.4 MG CAPS capsule Take 1 capsule (0.4 mg total) by mouth daily. (Patient taking differently: Take 0.8 mg by mouth at bedtime. ) 7 capsule 0  . warfarin (COUMADIN) 5 MG tablet Take 5-7.5 mg by mouth daily. Alternate daily between 5mg  to 7.5mg .    . rosuvastatin (CRESTOR) 5 MG tablet Take 5 mg by mouth every Monday. Monday      Home: Home Living Family/patient expects to be discharged to:: Private residence Living Arrangements: Spouse/significant other (has dementia; requires 24/7 due to cognition) Available Help at Discharge: Personal care attendant (paying friend to help) Type of Home: House Home Access: Stairs to enter [employer] of Steps: 3 Entrance Stairs-Rails: None Home Layout: Able to live on main level with bedroom/bathroom Bathroom Shower/Tub: Holiday representative Accessibility: Yes Home Equipment: Environmental consultant - 2 wheels, Cane - single point, Shower seat, Bedside commode (BSC over toilet)  Functional History: Prior Function Level of Independence: Independent with assistive device(s) Comments: recent falls began due to imbalance after Oct 16 acoustic neuroma surgery; began using RW and has not fallen when using RW Functional Status:  Mobility: Bed Mobility Overal bed mobility: Modified Independent General bed mobility comments: with rail; pt requires incr time in sitting to allow imbalance sensation to subside Transfers Overall transfer level: Needs assistance Equipment used: Rolling  walker (2 wheeled) Transfers: Sit to/from Stand Sit to Stand: Min assist General transfer comment: x3; unsafe use of RW with tipping; uncontrolled descents Ambulation/Gait Ambulation/Gait assistance: Min assist Ambulation Distance (Feet): 70 Feet Assistive device: Rolling walker (2 wheeled) Gait Pattern/deviations: Step-through pattern, Decreased stride length, Ataxic General Gait  Details: limited by fatigue of legs Gait velocity interpretation: Below normal speed for age/gender    ADL:    Cognition: Cognition Overall Cognitive Status: Within Functional Limits for tasks assessed Orientation Level: Oriented X4 Cognition Arousal/Alertness: Awake/alert Behavior During Therapy: WFL for tasks assessed/performed Overall Cognitive Status: Within Functional Limits for tasks assessed  Blood pressure 110/76, pulse 113, temperature 98.8 F (37.1 C), temperature source Oral, resp. rate 20, height 6' (1.829 m), weight 64.864 kg (143 lb), SpO2 98 %. Physical Exam  Constitutional: He is oriented to person, place, and time.  HENT:  Right facial droop  Eyes: EOM are normal.  Neck: Normal range of motion. Neck supple. No thyromegaly present.  Cardiovascular:  Irregular irregular  Respiratory: Effort normal and breath sounds normal. No respiratory distress.  GI: Soft. Bowel sounds are normal. He exhibits no distension.  Neurological: He is alert and oriented to person, place, and time. He displays normal reflexes. No cranial nerve deficit. He exhibits normal muscle tone. Coordination normal.  Follows full commands. Good insight and awareness. RIght peripheral 7 with facial droop and decreased right brow movement and lid closure. No hearing on right. UE: 4/5 bic,tric, wrist, HI. LE: 3/5 HF, 4/5 KE and 4/5 ADF/APF. Decreased LT/PP to bilateral LE's from feet to upper shin. FTN fairly intact bilaterally. No pronator drift.  Skin: Skin is warm and dry.  Psychiatric: He has a normal mood and affect. His behavior is normal.     Lab Results Last 24 Hours    Results for orders placed or performed during the hospital encounter of 09/23/15 (from the past 24 hour(s))  Glucose, capillary Status: Abnormal   Collection Time: 09/24/15 3:44 PM  Result Value Ref Range   Glucose-Capillary 135 (H) 65 - 99 mg/dL  Glucose, capillary Status: Abnormal   Collection Time:  09/24/15 8:27 PM  Result Value Ref Range   Glucose-Capillary 133 (H) 65 - 99 mg/dL   Comment 1 Notify RN    Comment 2 Document in Chart   Protime-INR Status: Abnormal   Collection Time: 09/25/15 5:27 AM  Result Value Ref Range   Prothrombin Time 16.1 (H) 11.6 - 15.2 seconds   INR 1.27 0.00 - 99991111  Basic metabolic panel Status: Abnormal   Collection Time: 09/25/15 5:27 AM  Result Value Ref Range   Sodium 137 135 - 145 mmol/L   Potassium 3.0 (L) 3.5 - 5.1 mmol/L   Chloride 99 (L) 101 - 111 mmol/L   CO2 31 22 - 32 mmol/L   Glucose, Bld 105 (H) 65 - 99 mg/dL   BUN 7 6 - 20 mg/dL   Creatinine, Ser 0.75 0.61 - 1.24 mg/dL   Calcium 8.5 (L) 8.9 - 10.3 mg/dL   GFR calc non Af Amer >60 >60 mL/min   GFR calc Af Amer >60 >60 mL/min   Anion gap 7 5 - 15  CBC Status: Abnormal   Collection Time: 09/25/15 5:27 AM  Result Value Ref Range   WBC 4.4 4.0 - 10.5 K/uL   RBC 4.06 (L) 4.22 - 5.81 MIL/uL   Hemoglobin 10.4 (L) 13.0 - 17.0 g/dL   HCT 33.6 (L) 39.0 - 52.0 %   MCV 82.8 78.0 -  100.0 fL   MCH 25.6 (L) 26.0 - 34.0 pg   MCHC 31.0 30.0 - 36.0 g/dL   RDW 15.5 11.5 - 15.5 %   Platelets 315 150 - 400 K/uL  Magnesium Status: Abnormal   Collection Time: 09/25/15 5:27 AM  Result Value Ref Range   Magnesium 1.4 (L) 1.7 - 2.4 mg/dL  Glucose, capillary Status: Abnormal   Collection Time: 09/25/15 6:31 AM  Result Value Ref Range   Glucose-Capillary 101 (H) 65 - 99 mg/dL   Comment 1 Notify RN    Comment 2 Document in Chart   Glucose, capillary Status: Abnormal   Collection Time: 09/25/15 11:20 AM  Result Value Ref Range   Glucose-Capillary 102 (H) 65 - 99 mg/dL   Comment 1 Notify RN    Comment 2 Document in Chart       Imaging Results (Last 48 hours)    Ct Head Wo Contrast  09/25/2015 CLINICAL  DATA: 74 year old diabetic hypertensive male with atrial fibrillation on anticoagulation with subdural collection. Prior surgery for vestibular schwannoma. Recurrence. Subsequent encounter. EXAM: CT HEAD WITHOUT CONTRAST TECHNIQUE: Contiguous axial images were obtained from the base of the skull through the vertex without intravenous contrast. COMPARISON: 09/23/2015 and 06/28/2007 head CT. 08/07/2015 brain MR. FINDINGS: Right sided subdural broad-based collection with maximal thickness of 7.7 mm without significant change. Local mass effect without midline shift. Left-sided extra-axial collection may represent prominent subarachnoid spaces but appears slightly different than recent MR and remote CT and may represent isodense left subdural collection with maximal thickness of 4.4 mm. Remote right occipital craniotomy for resection of vestibular schwannoma with recurrence as noted on recent MR. Encephalomalacia along the operative tract unchanged. No CT evidence of large acute infarct. Baseline atrophy. Vascular calcifications. Remote right nasal bone surgery with screw in place. IMPRESSION: Overall similar appearance to the recent CT with broad-based right-sided and possibly smaller left-sided subdural hematoma with local mass effect without midline shift. Postoperative changes right posterior fossa with recurrent right-sided vestibular schwannoma better delineated on recent MR. Electronically Signed By: Genia Del M.D. On: 09/25/2015 07:35   Ct Head Wo Contrast  09/23/2015 CLINICAL DATA: New onset of weakness. Patient is anticoagulated, for atrial fibrillation. Multiple recent falls. History of recurrent vestibular schwannoma. EXAM: CT HEAD WITHOUT CONTRAST TECHNIQUE: Contiguous axial images were obtained from the base of the skull through the vertex without intravenous contrast. COMPARISON: MR brain 08/07/2015. FINDINGS: Since the previous exam, the patient has developed an asymmetric extra-axial  collection over the RIGHT frontal convexity, 6-7 mm thickness, displaying low to intermediate attenuation (25-44 Hounsfield units.) This was not present on recent MR, and is consistent with a subacute RIGHT subdural hematoma. No significant midline shift, but there is mild mass effect on the RIGHT frontal cortex. There is no definite LEFT-sided collection. Prominence of the extra-axial spaces on the LEFT is felt to represent atrophy. Generalized atrophy. Hypoattenuation of white matter representing chronic microvascular ischemic change. RIGHT suboccipital craniectomy and surgical encephalomalacia of the RIGHT lateral cerebellum. Soft tissue within the flared RIGHT IAC represents recurrent vestibular schwannoma. Cavernous carotid vascular calcification. Cataract extraction. No sinus air-fluid level. Negative mastoids. IMPRESSION: Interval development since October 2016 MR of a 6-7 mm subacute subdural hematoma on the RIGHT. No midline shift, but slight mass effect on the frontal cortex. Neurosurgical consultation may be warranted. Findings discussed with ordering provider. Electronically Signed By: Staci Righter M.D. On: 09/23/2015 16:59   Dg Chest Port 1 View  09/23/2015 CLINICAL DATA: Weakness, possible CVA.  EXAM: PORTABLE CHEST 1 VIEW COMPARISON: 02/14/2013 FINDINGS: Cardiomediastinal silhouette is normal. Mediastinal contours appear intact. Atherosclerotic disease of the aortic arch is noted. There is no evidence of focal airspace consolidation, pleural effusion or pneumothorax. There is a eventration of the right hemidiaphragm. Osseous structures are without acute abnormality. The bilateral humeral heads are high riding, suggestive of chronic rotator cuff injuries. Soft tissues are grossly normal. IMPRESSION: No radiographic evidence of acute cardiopulmonary abnormality. Atherosclerotic disease of the aorta. Electronically Signed By: Fidela Salisbury M.D. On: 09/23/2015 17:00      Assessment/Plan: Diagnosis: SDH after fall, hx of right acoustic neuroma 1. Does the need for close, 24 hr/day medical supervision in concert with the patient's rehab needs make it unreasonable for this patient to be served in a less intensive setting? Yes 2. Co-Morbidities requiring supervision/potential complications: afib, dm with dpn 3. Due to bladder management, bowel management, safety, skin/wound care, disease management, medication administration, pain management and patient education, does the patient require 24 hr/day rehab nursing? Yes 4. Does the patient require coordinated care of a physician, rehab nurse, PT (1-2 hrs/day, 5 days/week) and OT (1-2 hrs/day, 5 days/week) to address physical and functional deficits in the context of the above medical diagnosis(es)? Yes Addressing deficits in the following areas: balance, endurance, locomotion, strength, transferring, bowel/bladder control, bathing, dressing, feeding, grooming, toileting and psychosocial support 5. Can the patient actively participate in an intensive therapy program of at least 3 hrs of therapy per day at least 5 days per week? Yes 6. The potential for patient to make measurable gains while on inpatient rehab is excellent 7. Anticipated functional outcomes upon discharge from inpatient rehab are modified independent with PT, modified independent with OT, n/a with SLP. 8. Estimated rehab length of stay to reach the above functional goals is: 7 days 9. Does the patient have adequate social supports and living environment to accommodate these discharge functional goals? Yes 10. Anticipated D/C setting: Home 11. Anticipated post D/C treatments: HH therapy and Outpatient therapy 12. Overall Rehab/Functional Prognosis: excellent  RECOMMENDATIONS: This patient's condition is appropriate for continued rehabilitative care in the following setting: CIR Patient has agreed to participate in recommended program. Yes Note that  insurance prior authorization may be required for reimbursement for recommended care.  Comment: Pt has capability of returning to mod I level using appropriate technique and equipment. He will NOT be able to care for his wife once home other than providing some supervision. Rehab Admissions Coordinator to follow up.  Thanks,  Meredith Staggers, MD, [facility]     09/25/2015       Revision History     Date/Time User Provider Type Action   09/25/2015 1:54 PM Meredith Staggers, MD Physician Sign   09/25/2015 1:38 PM Cathlyn Parsons, PA-C Physician Assistant Pend   View Details Report       Routing History     Date/Time From To Method   09/25/2015 1:54 PM Meredith Staggers, MD Meredith Staggers, MD In Basket   09/25/2015 1:54 PM Meredith Staggers, MD Jani Gravel, MD Fax

## 2015-09-26 NOTE — Progress Notes (Signed)
Physical Therapy Treatment Patient Details Name: Tommy Weeks MRN: [id number] DOB: February 07, 1941 Today's Date: 09/26/2015    History of Present Illness Adm s/p multiple falls with Rt SDH and possible small Lt SDH. 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 Pt reports no improvement of symptoms post-procedure PMHx- afib on coumadin, acoustic neuroma surgery '02 with Rt facial nerve palsy (facial droop), DM, prostate Ca    PT Comments    Patient requires repetition of cues and tasks to improve his carryover. Educated on vestibular system and using UEs sensation (via RW or surfaces) to incr sensory input. Pt with better carryover of compensatory strategies after further education. "I like to know the why of things"   Follow Up Recommendations  CIR     Equipment Recommendations  None recommended by PT    Recommendations for Other Services       Precautions / Restrictions Precautions Precautions: Fall    Mobility  Bed Mobility Overal bed mobility: Modified Independent             General bed mobility comments: pt requires incr time in sitting to allow imbalance sensation to subside  Transfers Overall transfer level: Needs assistance Equipment used: Rolling walker (2 wheeled) Transfers: Sit to/from Stand Sit to Stand: Min assist         General transfer comment: x3; unsafe use of RW with tipping; uncontrolled descents  Ambulation/Gait Ambulation/Gait assistance: Min assist Ambulation Distance (Feet): 70 Feet Assistive device: Rolling walker (2 wheeled) Gait Pattern/deviations: Step-through pattern;Decreased stride length;Ataxic Gait velocity: encouraged to decr especially with turns or head turns to minimize dizziness/imbalance Gait velocity interpretation: Below normal speed for age/gender General Gait Details: limited by fatigue of legs   Stairs            Wheelchair Mobility    Modified Rankin (Stroke Patients Only)       Balance Overall balance assessment: Needs assistance   Sitting balance-Leahy Scale: Fair       Standing balance-Leahy Scale: Poor           Rhomberg - Eyes Opened: 30 (no UE support, minguard) Rhomberg - Eyes Closed: 10 (no UE support, minguard; >normal sway) High level balance activites: Backward walking;Direction changes;Turns;Head turns High Level Balance Comments: pt able to carryover instructions to turn slowly from 12/6, however continues to turn head abruptly in standing, triggering vestibular symptoms    Cognition Arousal/Alertness: Awake/alert Behavior During Therapy: WFL for tasks assessed/performed Overall Cognitive Status: Within Functional Limits for tasks assessed                      Exercises General Exercises - Lower Extremity Toe Raises: AAROM;Both;5 reps;Standing (holding RW) Heel Raises: AAROM;Both;5 reps;Standing (holding RW) Other Exercises Other Exercises: wall bumps with heels 4 inches from wall; attempt increase to 6" and pt became fearful of leaning that far posterior    General Comments        Pertinent Vitals/Pain Pain Assessment: No/denies pain    Home Living                      Prior Function            PT Goals (current goals can now be found in the care plan section) Acute Rehab PT Goals Patient Stated Goal: to be able to care for his wife in their home PT Goal Formulation: With patient Time For Goal Achievement: 10/02/15 Potential to Achieve Goals: Good Progress  towards PT goals: Progressing toward goals    Frequency  Min 4X/week    PT Plan Current plan remains appropriate    Co-evaluation             End of Session Equipment Utilized During Treatment: Gait belt Activity Tolerance: Patient tolerated treatment well Patient left: in bed;with call bell/phone within reach;with bed alarm set     Time: [id number] PT Time Calculation (min) (ACUTE ONLY): 29 min  Charges:  $Gait Training: 8-22  mins $Therapeutic Activity: 8-22 mins                    G Codes:      Shemika Robbs Oct 03, 2015, 11:34 AM Pager [phone]

## 2015-09-26 NOTE — Progress Notes (Signed)
Rehab admissions - I met with patient.  He is agreeable to inpatient rehab admission today.  Waiting on attending MD to determine if patient is medically ready for inpatient rehab today.  Call me for questions.  #[phone]

## 2015-09-27 ENCOUNTER — Inpatient Hospital Stay (HOSPITAL_COMMUNITY): Payer: Medicare Other | Admitting: Physical Therapy

## 2015-09-27 ENCOUNTER — Inpatient Hospital Stay (HOSPITAL_COMMUNITY): Payer: Medicare Other

## 2015-09-27 ENCOUNTER — Inpatient Hospital Stay (HOSPITAL_COMMUNITY): Payer: Medicare Other | Admitting: Occupational Therapy

## 2015-09-27 LAB — GLUCOSE, CAPILLARY
GLUCOSE-CAPILLARY: 94 mg/dL (ref 65–99)
GLUCOSE-CAPILLARY: 123 mg/dL — AB (ref 65–99)
GLUCOSE-CAPILLARY: 100 mg/dL — AB (ref 65–99)
Glucose-Capillary: 114 mg/dL — ABNORMAL HIGH (ref 65–99)

## 2015-09-27 MED ORDER — ENSURE ENLIVE PO LIQD: 237.0000 mL | Freq: Two times a day (BID) | ORAL | Status: DC

## 2015-09-27 MED ORDER — BOOST / RESOURCE BREEZE PO LIQD
1.0000 | ORAL | Status: DC
  Administered 2015-09-29: 1 via ORAL

## 2015-09-27 NOTE — Progress Notes (Signed)
Occupational Therapy Note  Patient Details  Name: Tommy Weeks MRN: [id number] Date of Birth: 1941/07/28  Today's Date: 09/27/2015 OT Individual Time: 1100-1200 OT Individual Time Calculation (min): 60 min   Pt denied pain Individual therapy  Pt resting in bed upon arrival and agreeable to engaging in therapy.  Pt requested use of bathroom before leaving room.  Pt amb with HHA into bathroom and stood at toilet when attempting to void.  Pt amb to sink and washed hands before amb to therapy gym.  Pt required min A (HHA) for ambulation with frequent LOB noted.  Pt engaged in dynamic standing tasks involving reaching, turning, and tossing horseshoes at target.  Pt requires min A for standing balance during tasks.  Pt transitioned to performing semi-squats from mat (8 X 2) before requesting to use bathroom again.  Pt amb with HHA to ADL bathroom.  Pt returned to room at end of session and returned to bed.  Focus on activity tolerance, sit<>stand, standing balance, and safety awareness to increased independence with BADLs.   Leotis Shames [facility] 09/27/2015, 2:08 PM

## 2015-09-27 NOTE — Evaluation (Signed)
Occupational Therapy Assessment and Plan  Patient Details  Name: Tommy Weeks MRN: [id number] Date of Birth: 1941/06/12  OT Diagnosis: ataxia and muscle weakness (generalized) Rehab Potential: Rehab Potential (ACUTE ONLY): Good ELOS: 10-14 days   Today's Date: 09/27/2015 OT Individual Time: 0800-0905 OT Individual Time Calculation (min): 65 min     Problem List:  Patient Active Problem List   Diagnosis Date Noted  . Frequent falls 09/26/2015  . Atrial fibrillation with RVR ([place]) 09/26/2015  . Essential hypertension 09/26/2015  . Hypokalemia 09/26/2015  . Hypomagnesemia 09/26/2015  . BPH (benign prostatic hyperplasia) 09/26/2015  . Controlled diabetes mellitus ([place]) 09/26/2015  . Traumatic subdural hematoma ([place]) 09/26/2015  . Anxiety and depression   . Paroxysmal atrial fibrillation ([facility])   . Type 2 diabetes mellitus with diabetic neuropathy ([place])   . Weakness   . Warfarin-induced coagulopathy ([place]) 09/23/2015  . SDH (subdural hematoma) ([place]) 09/23/2015  . 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 Protein-calorie malnutrition, severe ([place]) 07/06/2015  . Atrial fibrillation with rapid ventricular response ([place]) 07/04/2015  . Urinary retention 04/04/2013  . Atrial fibrillation ([place]) 03/31/2013  . Diabetes mellitus ([place]) 03/31/2013  . Hypertension 03/31/2013  . Abdominal or pelvic swelling, mass, or lump, other specified site 03/07/2013    Past Medical History:  Past Medical History  Diagnosis Date  . Hypertension   . 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 Anxiety   . Depression   . 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 Prostate cancer ([place])   . History of urinary retention   . History of prostatitis   . Chronic atrial fibrillation ([place])   . Dysrhythmia   . Type 2 diabetes mellitus (H[facility])      NIDDM  . Complication of anesthesia     urinary retention  . Hyperlipidemia   . Neuromuscular disorder ([place])     imbalance s/p craniectomy  . Prostate cancer [facility])    Past Surgical History:  Past Surgical History  Procedure Laterality Date  . Cholecystectomy  90's  . Retroperitoneal mass excision  03/31/2013  . Laparotomy N/A 03/31/2013    Procedure: Open resection of abdominal mass;  Surgeon: Stark Klein, MD;  Location: [facility];  Service: General;  Laterality: N/A;  . Cardiac catheterization  1990  . Inguinal hernia repair Bilateral 05/24/2013    Procedure: bilateral open HERNIA REPAIR INGUINAL ADULT;  Surgeon: Gayland Curry, MD;  Location: [facility];  Service: General;  Laterality: Bilateral;  . Insertion of mesh Bilateral 05/24/2013    Procedure: INSERTION OF MESH;  Surgeon: Gayland Curry, MD;  Location: [facility];  Service: General;  Laterality: Bilateral;  . Umbilical hernia repair  1990's  . Craniectomy for excision of acoustic neuroma Right 02-12-2001  . Hydrocele excision Right 11-26-2007  . Exploration face right side  11-29-2001  &  02-06-2003    post craniotomy resection acoustic neuroma--  facial paralysis  . Transthoracic echocardiogram  05-11-2007  (per PCP note)    ef 55-65%,  mild TR and MR  . Eye surgery Bilateral     cataract OU w/ IOL  . Transrectal ultrasound N/A 06/11/2015    Procedure: TRANSRECTAL ULTRASOUND AND BIOPSY;  Surgeon: Kathie Rhodes, MD;  Location: [facility];  Service: Urology;  Laterality: N/A;    Assessment & Plan Clinical Impression: Patient is a 74 y.o. 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 Patient lives with spouse who has reported dementia require 24-hour care. He has a Personal care attendant to help his wife. Patient uses a single-point cane  prior to admission. 2 level home 3 steps to entry with bedroom first floor. Presented 09/23/2015 with weakness lower extremities and to a lesser extent upper extremities. He does note 3 separate falls over the past 2 weeks hitting his head against a door jam in the bathroom. INR on admission of 2.97. CT of the brain showed a right frontal isodense subacute subdural hematoma without significant mass effect. Patient's Coumadin was reversed. Follow-up neurosurgery Dr. Sherwood Gambler advise conservative care. Latest follow-up cranial CT scan 09/25/2015 with overall appearance smaller left-sided subdural hematoma without midline shift. Tolerating a regular consistency diet. Patient transferred to CIR on 09/26/2015 .    Patient currently requires min to mod A  with basic self-care skills and and basic mobility secondary to muscle weakness, decreased cardiorespiratoy endurance, ataxia and decreased coordination, decreased memory and decreased standing balance, decreased balance strategies and difficulty maintaining precautions.  Prior to hospitalization, patient could complete ADL with independent .  Patient will benefit from skilled intervention to decrease level of assist with basic self-care skills and increase independence with basic self-care skills prior to discharge home with care partner.  Anticipate patient will require intermittent supervision with IADLs and follow up outpatient.  OT - End of Session Activity Tolerance: Tolerates < 10 min activity, no significant change in vital signs Endurance Deficit: Yes OT Assessment Rehab Potential (ACUTE ONLY): Good OT Patient demonstrates impairments in the following area(s): Balance;Edema;Endurance;Motor;Nutrition;Pain;Safety;Sensory;Skin Integrity;Cognition OT Basic ADL's Functional Problem(s): Eating;Grooming;Bathing;Dressing;Toileting OT Advanced ADL's Functional Problem(s): Simple Meal Preparation OT Transfers Functional Problem(s): Toilet;Tub/Shower OT  Additional Impairment(s): None OT Plan OT Intensity: Minimum of 1-2 x/day, 45 to 90 minutes OT Frequency: 5 out of 7 days OT Duration/Estimated Length of Stay: 10-14 days OT Treatment/Interventions: Balance/vestibular training;Cognitive remediation/compensation;Community reintegration;Discharge planning;DME/adaptive equipment instruction;Disease mangement/prevention;Neuromuscular re-education;Patient/family education;Self Care/advanced ADL retraining;Splinting/orthotics;Therapeutic Exercise;UE/LE Coordination activities;UE/LE Strength taining/ROM;Therapeutic Activities;Skin care/wound managment;Psychosocial support;Pain management;Functional mobility training OT Self Feeding Anticipated Outcome(s): mod I  OT Basic Self-Care Anticipated Outcome(s): mod I  OT Toileting Anticipated Outcome(s): mod I OT Bathroom Transfers Anticipated Outcome(s): mod I OT Recommendation Recommendations for Other Services: Neuropsych consult Patient destination: Home Follow Up Recommendations: Home health OT   Skilled Therapeutic Intervention OT eval initiated with OT goals, purpose and role discussed. Self care retraining at shower level with focus on functional ambulation with HHA, toilet transfer, shower stall transfers, dynamic and static standing balance, activity tolerance, balance reactions, safety awareness, short term memory recall, d/c planning etc. Pt with LOBs throughout session requiring mod A to recover esp when ambulating longer distances. Pt also with decr activity tolerance- reporting fatigue at end of session. Pt reports he does get dizzy at times but none to report today. Pt left with next therapist sitting EOB.  OT Evaluation Precautions/Restrictions  Precautions Precautions: Fall Precaution Comments: hx of R facial paralysis Restrictions Weight Bearing Restrictions: No General Chart Reviewed: Yes Family/Caregiver Present: No   Pain Pain Assessment Pain Assessment: No/denies pain Home  Living/Prior Functioning Home Living Available Help at Discharge: Personal care attendant, Other (Comment) (for wife) Type of Home: House Home Access: Stairs to enter Entrance  Stairs-Number of Steps: 3 Entrance Stairs-Rails: None Home Layout: Able to live on main level with bedroom/bathroom Bathroom Shower/Tub: Holiday representative Accessibility: Yes Additional Comments: hiring friend to stay 24/7, 4 days a week and then son and sister plan to help out other days  Lives With: Spouse (who requires 24 hr supervision ) Prior Function Level of Independence: Independent with homemaking with ambulation, Independent with basic ADLs, Independent with gait, Independent with transfers, Requires assistive device for independence  Able to Take Stairs?: Yes Driving: Yes Comments: recent falls began due to imbalance after Oct 16 acoustic neuroma surgery; began using RW and has not fallen when using RW, enjoys watching History channel and the news ADL  see functional navigator  Vision/Perception  Vision- History Baseline Vision/History: No visual deficits Patient Visual Report: No change from baseline Vision- Assessment Vision Assessment?: No apparent visual deficits  Cognition Overall Cognitive Status: Within Functional Limits for tasks assessed Arousal/Alertness: Awake/alert Orientation Level: Person;Place;Situation Person: Oriented Place: Oriented Situation: Oriented Year: 2016 Month: December Day of Week: Incorrect (Saturday) Memory: Impaired Memory Impairment: Decreased recall of new information Immediate Memory Recall: Sock;Blue;Bed Memory Recall: Sock;Blue;Bed Memory Recall Sock: With Cue Memory Recall Blue: Without Cue Memory Recall Bed: Without Cue Attention: Selective Selective Attention: Appears intact Awareness: Appears intact Problem Solving: Appears intact Safety/Judgment: Appears intact Sensation Sensation Light Touch: Impaired Detail (Simultaneous filing. User  may not have seen previous data.) Light Touch Impaired Details: Impaired RLE;Impaired LLE Stereognosis: Appears Intact Hot/Cold: Appears Intact (Simultaneous filing. User may not have seen previous data.) Proprioception: Appears Intact Additional Comments: decreased sensation to LT left thigh/knee, impaired to absent in B ankles and feet Coordination Gross Motor Movements are Fluid and Coordinated: No (Simultaneous filing. User may not have seen previous data.) Fine Motor Movements are Fluid and Coordinated: Yes (Simultaneous filing. User may not have seen previous data.) Coordination and Movement Description: left limb ataxia noted with Fine motor tasks Heel Shin Test: WFL RLE, impaired coordination and weakness LLE Motor  Motor Motor: Abnormal postural alignment and control Motor - Skilled Clinical Observations: poor balance; generalized weakness Mobility  Bed Mobility Bed Mobility: Supine to Sit;Sitting - Scoot to Edge of Bed Supine to Sit: 5: Supervision Sitting - Scoot to [employer] of Bed: 5: Supervision Transfers Transfers: Sit to Stand;Stand to Sit Sit to Stand: 4: Min assist Stand to Sit: 4: Min assist  Trunk/Postural Assessment  Cervical Assessment Cervical Assessment: Within Functional Limits Thoracic Assessment Thoracic Assessment: Within Functional Limits Lumbar Assessment Lumbar Assessment: Within Functional Limits Postural Control Postural Control: Within Functional Limits  Balance Balance Balance Assessed: Yes Dynamic Sitting Balance Dynamic Sitting - Balance Support: During functional activity Dynamic Sitting - Level of Assistance: 3: Mod assist Dynamic Sitting - Balance Activities: Reaching across midline;Trunk control activities;Reaching for objects Static Standing Balance Static Standing - Balance Support: During functional activity Static Standing - Level of Assistance: 4: Min assist Dynamic Standing Balance Dynamic Standing - Level of Assistance: 3: Mod  assist Dynamic Standing - Balance Activities: Reaching for objects;Reaching across midline Extremity/Trunk Assessment RUE Assessment RUE Assessment: Within Functional Limits LUE Assessment LUE Assessment:  (4/5)   See Function Navigator for Current Functional Status.   Refer to Care Plan for Long Term Goals  Recommendations for other services: Neuropsych  Discharge Criteria: Patient will be discharged from OT if patient refuses treatment 3 consecutive times without medical reason, if treatment goals not met, if there is a change in medical status, if patient makes no progress towards goals or  if patient is discharged from hospital.  The above assessment, treatment plan, treatment alternatives and goals were discussed and mutually agreed upon: by patient  Nicoletta Ba 09/27/2015, 9:46 AM

## 2015-09-27 NOTE — Progress Notes (Signed)
Patient information reviewed and entered into eRehab system by Jaidin Ugarte, RN, CRRN, PPS Coordinator.  Information including medical coding and functional independence measure will be reviewed and updated through discharge.     Per nursing patient was given "Data Collection Information Summary for Patients in Inpatient Rehabilitation Facilities with attached "Privacy Act Statement-Health Care Records" upon admission.  

## 2015-09-27 NOTE — Progress Notes (Signed)
Initial Nutrition Assessment  DOCUMENTATION CODES:   Severe malnutrition in context of chronic illness, Underweight  INTERVENTION:  Provide Ensure Enlive po BID, each supplement provides 350 kcal and 20 grams of protein.  Provide Boost Breeze po once daily, each supplement provides 250 kcal and 9 grams of protein.  Encourage adequate PO intake.   NUTRITION DIAGNOSIS:   Malnutrition related to chronic illness as evidenced by percent weight loss, severe depletion of body fat, severe depletion of muscle mass.  GOAL:   Patient will meet greater than or equal to 90% of their needs  MONITOR:   PO intake, Supplement acceptance, Skin, Weight trends, Labs, I & O's  REASON FOR ASSESSMENT:   Malnutrition Screening Tool    ASSESSMENT:   74 y.o. 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 Presented 09/23/2015 with weakness lower extremities and to a lesser extent upper extremities. He does note 3 separate falls over the past 2 weeks hitting his head against a door jam in the bathroom.  CT of the brain showed a right frontal isodense subacute subdural hematoma without significant mass effect  Pt reports having a decreased appetite which has been ongoing over the past 2-3 months. He reports consuming mostly 3 meals a day, however portions at meals have been reported small. Pt says he has been forcing himself to eat at meals. Meal completion has been 50-90%. Pt with weight loss. Per Epic weight records, pt with a 11% weight loss in 4 months. Usual body weight reported to be ~150 lbs. Pt currently has Boost Breeze ordered and has been consuming them. Pt is agreeable to Ensure to aid in caloric and protein needs, however reports he would still like a Boost Breeze at least once daily. RD to modify orders.    Nutrition-Focused physical exam completed. Findings are severe fat depletion, severe muscle depletion, and no edema.   Labs and medications reviewed.   Diet Order:  Diet Carb Modified Fluid consistency:: Thin; Room service appropriate?: Yes  Skin:  Wound (see comment) (Stage I on L heel, unstg ulcer on coccyx)  Last BM:  12/5  Height:   Ht Readings from Last 1 Encounters:  09/26/15 6' (1.829 m)    Weight:   Wt Readings from Last 1 Encounters:  09/26/15 135 lb (61.236 kg)    Ideal Body Weight:  80.9 kg  BMI:  Body mass index is 18.31 kg/(m^2).  Estimated Nutritional Needs:   Kcal:  [id number]  Protein:  90-100 grams  Fluid:  1.9 - 2 L/day  EDUCATION NEEDS:   No education needs identified at this time  Corrin Parker, MS, RD, LDN Pager # [phone] After hours/ weekend pager # [phone]

## 2015-09-27 NOTE — Plan of Care (Signed)
Problem: RH SKIN INTEGRITY Goal: RH STG MAINTAIN SKIN INTEGRITY WITH ASSISTANCE STG Maintain Skin Integrity With Mod Assistance.  Outcome: Progressing Foam placed on sacrum; use of MGP bottom/scrotum

## 2015-09-27 NOTE — Progress Notes (Signed)
[facility] [facility] PHYSICAL MEDICINE & REHABILITATION     PROGRESS NOTE    Subjective/Complaints: Pt had a good night. Had some film/crusting around right eye. Denies pain. Wants to get up to the BR by himself so that he's not inconveniencing staff  ROS: Pt denies fever, rash/itching, headache, blurred or double vision, nausea, vomiting, abdominal pain, diarrhea, chest pain, shortness of breath, palpitations, dysuria, dizziness, neck or back pain, bleeding, anxiety, or depression  Objective: Vital Signs: Blood pressure 121/68, pulse 99, temperature 99 F (37.2 C), temperature source Oral, resp. rate 16, height 6' (1.829 m), weight 61.236 kg (135 lb), SpO2 98 %. No results found.  Recent Labs  09/25/15 0527 09/26/15 1908  WBC 4.4 6.1  HGB 10.4* 11.1*  HCT 33.6* 35.5*  PLT 315 345    Recent Labs  09/26/15 0327 09/26/15 1908  NA 136 134*  K 3.4* 4.2  CL 100* 96*  GLUCOSE 103* 131*  BUN 8 10  CREATININE 0.77 0.99  CALCIUM 8.4* 8.8*   CBG (last 3)   Recent Labs  09/26/15 1743 09/26/15 2136 09/27/15 0644  GLUCAP 92 113* 94    Wt Readings from Last 3 Encounters:  09/26/15 61.236 kg (135 lb)  09/26/15 63.594 kg (140 lb 3.2 oz)  08/06/15 67.268 kg (148 lb 4.8 oz)    Physical Exam:  Constitutional: He is oriented to person, place, and time.  HENT:  OD with redness in conjunctiva---no drainage Normocephalic. Atraumatic Eyes: EOM and Conj are normal.  Neck: Normal range of motion. Neck supple. No thyromegaly present.  Cardiovascular: Irregular irregular , rate controlled Respiratory: Effort normal and breath sounds normal. No respiratory distress.  GI: Soft. Bowel sounds are normal. He exhibits no distension.  Neurological: He is alert and oriented to person, place, and time. He exhibits normal muscle tone.  Follows full commands.  Good insight and awareness.  Right facial weakness. No hearing on right.  B/L UE: 4/5 proximal to distal B/L LE: 3 to 3+/5 Hip  flexion, 4/5 knee extension, ankle dorsi/plantar flexion Decreased sensation to light touch distal LE below the knees bilaterally 3+ DTR RLE and RUE. Skin: Skin is warm and dry. A few small abrasions noted Psychiatric: He has a normal mood and affect. His behavior is normal   Assessment/Plan: 1. 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 Physiatrist is providing close team supervision and 24 hour management of active medical problems listed below. Physiatrist and rehab team continue to assess barriers to discharge/monitor patient progress toward functional and medical goals.  Function:  Bathing Bathing position   Position: Shower  Bathing parts Body parts bathed by patient: Right arm, Left arm, Chest, Abdomen, Front perineal area, Buttocks, Right upper leg, Left upper leg, Right lower leg, Left lower leg Body parts bathed by helper: Back  Bathing assist Assist Level: Touching or steadying assistance(Pt > 75%)      Upper Body Dressing/Undressing Upper body dressing   What is the patient wearing?: Pull over shirt/dress     Pull over shirt/dress - Perfomed by patient: Thread/unthread right sleeve, Thread/unthread left sleeve, Put head through opening, Pull shirt over trunk          Upper body assist Assist Level: Set up   Set up : To obtain clothing/put away  Lower Body Dressing/Undressing Lower body dressing   What is the patient wearing?: Pants, Non-skid slipper socks, Shoes  Pants- Performed by patient: Thread/unthread right pants leg, Thread/unthread left pants leg, Pull pants up/down   Non-skid slipper socks- Performed by patient: Don/doff right sock, Don/doff left sock (doff)       Shoes - Performed by patient: Don/doff right shoe, Don/doff left shoe            Lower body assist Assist for lower body dressing: Touching or steadying  assistance (Pt > 75%)      Toileting Toileting   Toileting steps completed by patient: Adjust clothing prior to toileting, Performs perineal hygiene, Adjust clothing after toileting   Toileting Assistive Devices: Grab bar or rail  Toileting assist Assist level: Touching or steadying assistance (Pt.75%)   Transfers Chair/bed transfer   Chair/bed transfer method: Ambulatory Chair/bed transfer assist level: Touching or steadying assistance (Pt > 75%)       Locomotion Ambulation       Assist level: Touching or steadying assistance (Pt > 75%)   Wheelchair          Cognition Comprehension Comprehension assist level: Understands basic 90% of the time/cues < 10% of the time  Expression Expression assist level: Expresses basic 90% of the time/requires cueing < 10% of the time.  Social Interaction       Social Interaction assist level: Interacts appropriately 90% of the time - Needs monitoring or encouragement for participation or interaction.  Problem Solving Problem solving assist level: Solves basic 90% of the time/requires cueing < 10% of the time  Memory Memory assist level: Recognizes or recalls 75 - 89% of the time/requires cueing 10 - 24% of the time   Medical Problem List and Plan: 1. Weakness secondary to SDH after fall,Hx right acoustic neuroma  -begin CIR therapies. Mod I goals 2. DVT Prophylaxis/Anticoagulation: SCD 3. Pain Management: Tylenol as needed 4. Mood: Ativan 0.5 mg every 6 hours as needed, Wellbutrin 100 mg twice a day, 5. Neuropsych: This patient is capable of making decisions on his own behalf. 6. Skin/Wound Care: Routine skin checks 7. Fluids/Electrolytes/Nutrition: Routine I&O with follow-up chemistries 8. History of atrial fibrillation. Chronic Coumadin discontinued secondary to subdural hematoma. Continue Cardizem 240 mg daily. Cardiac rate controlled at present 9. Hypertension. Toprol 25 mg daily, Norvasc 5 mg daily. 10. Diabetes mellitus with  peripheral neuropathy. Hemoglobin A1c 5.5. Continue sliding scale. Check blood sugars before meals and at bedtime 11. Hypokalemia. Follow-up chemistries today all personally reviewed. Potassium level has recovered. 12. BPH. Flomax 0.4 mg daily. Check PVRs 3 LOS (Days) 1 A FACE TO FACE EVALUATION WAS PERFORMED  SWARTZ,ZACHARY T 09/27/2015 10:07 AM

## 2015-09-27 NOTE — Evaluation (Addendum)
Physical Therapy Assessment and Plan  Patient Details  Name: Tommy Weeks MRN: [id number] Date of Birth: 02/12/1941  PT Diagnosis: Abnormality of gait, Ataxia, Coordination disorder, Difficulty walking, Impaired sensation and Muscle weakness Rehab Potential: Good ELOS: 10-14 days   Today's Date: 09/27/2015 PT Individual Time: 0900-1000 and 1545-1700 PT Individual Time Calculation (min): 60 min and 75 min    Problem List:  Patient Active Problem List   Diagnosis Date Noted  . Frequent falls 09/26/2015  . Atrial fibrillation with RVR ([place]) 09/26/2015  . Essential hypertension 09/26/2015  . Hypokalemia 09/26/2015  . Hypomagnesemia 09/26/2015  . BPH (benign prostatic hyperplasia) 09/26/2015  . Controlled diabetes mellitus ([place]) 09/26/2015  . Traumatic subdural hematoma ([place]) 09/26/2015  . Anxiety and depression   . Paroxysmal atrial fibrillation (H[facility])   . Type 2 diabetes mellitus with diabetic neuropathy ([place])   . Weakness   . Warfarin-induced coagulopathy ([place]) 09/23/2015  . SDH (subdural hematoma) ([place]) 09/23/2015  . 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 Protein-calorie malnutrition, severe ([place]) 07/06/2015  . Atrial fibrillation with rapid ventricular response ([place]) 07/04/2015  . Urinary retention 04/04/2013  . Atrial fibrillation ([place]) 03/31/2013  . Diabetes mellitus ([place]) 03/31/2013  . Hypertension 03/31/2013  . Abdominal or pelvic swelling, mass, or lump, other specified site 03/07/2013    Past Medical History:  Past Medical History  Diagnosis Date  . Hypertension   . 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 Anxiety   . Depression   . 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 Prostate cancer ([place])   . History of urinary retention   . History of prostatitis   . Chronic atrial fibrillation  ([place])   . Dysrhythmia   . Type 2 diabetes mellitus (HCC)     NIDDM  . Complication of anesthesia     urinary retention  . Hyperlipidemia   . Neuromuscular disorder ([place])     imbalance s/p craniectomy  . Prostate cancer [facility])    Past Surgical History:  Past Surgical History  Procedure Laterality Date  . Cholecystectomy  90's  . Retroperitoneal mass excision  03/31/2013  . Laparotomy N/A 03/31/2013    Procedure: Open resection of abdominal mass;  Surgeon: Stark Klein, MD;  Location: [place];  Service: General;  Laterality: N/A;  . Cardiac catheterization  1990  . Inguinal hernia repair Bilateral 05/24/2013    Procedure: bilateral open HERNIA REPAIR INGUINAL ADULT;  Surgeon: Gayland Curry, MD;  Location: [facility];  Service: General;  Laterality: Bilateral;  . Insertion of mesh Bilateral 05/24/2013    Procedure: INSERTION OF MESH;  Surgeon: Gayland Curry, MD;  Location: [facility];  Service: General;  Laterality: Bilateral;  . Umbilical hernia repair  1990's  . Craniectomy for excision of acoustic neuroma Right 02-12-2001  . Hydrocele excision Right 11-26-2007  . Exploration face right side  11-29-2001  &  02-06-2003    post craniotomy resection acoustic neuroma--  facial paralysis  . Transthoracic echocardiogram  05-11-2007  (per PCP note)    ef 55-65%,  mild TR and MR  . Eye surgery Bilateral     cataract OU w/ IOL  . Transrectal ultrasound N/A 06/11/2015    Procedure: TRANSRECTAL ULTRASOUND AND BIOPSY;  Surgeon: Kathie Rhodes, MD;  Location:  [place];  Service: Urology;  Laterality: N/A;    Assessment & Plan Clinical Impression: Tommy Weeks is a 74 y.o. 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 Patient lives with spouse who has reported dementia require 24-hour care. He has a Personal care  attendant to help his wife. Patient uses a single-point cane prior to admission. 2 level home 3 steps to entry with bedroom first floor. Presented 09/23/2015 with weakness lower extremities and to a lesser extent upper extremities. He does note 3 separate falls over the past 2 weeks hitting his head against a door jam in the bathroom. INR on admission of 2.97. CT of the brain showed a right frontal isodense subacute subdural hematoma without significant mass effect. Patient's Coumadin was reversed. Follow-up neurosurgery Dr. Sherwood Gambler advise conservative care. Latest follow-up cranial CT scan 09/25/2015 with overall appearance smaller left-sided subdural hematoma without midline shift. Tolerating a regular consistency diet. Bouts of hypokalemia 3.0 with potassium supplement added as well as magnesium 1.6. Patient transferred to CIR on 09/26/2015.   Patient currently requires min-mod A with mobility secondary to muscle weakness, decreased cardiorespiratoy endurance, unbalanced muscle activation, ataxia and decreased coordination, dizziness and decreased sitting balance, decreased standing balance, decreased postural control and decreased balance strategies.  Prior to hospitalization, patient was modified independent  with mobility and lived with Spouse (requires 24/7 supervision) in a House home.  Home access is 3Stairs to enter.  Patient will benefit from skilled PT intervention to maximize safe functional mobility, minimize fall risk and decrease caregiver burden for planned discharge home with intermittent assist.  Anticipate patient will benefit from follow up [facility] at discharge.  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 requires 24/7 supervision, paying friend for 24/7, 4 days a week and sister and son assisting other days per report PT Patient demonstrates impairments in the following area(s): Balance;Endurance;Motor;Nutrition;Pain;Sensory;Safety;Skin Integrity PT Transfers Functional Problem(s): Bed Mobility;Bed to Chair;Car;Furniture;Floor PT Locomotion Functional Problem(s): Ambulation;Wheelchair Mobility;Stairs PT Plan PT Intensity: Minimum of 1-2 x/day ,45 to 90 minutes PT Frequency: 5 out of 7 days PT Duration Estimated Length of Stay: 10-14 days PT Treatment/Interventions: Ambulation/gait training;Balance/vestibular training;Community reintegration;Discharge planning;Disease management/prevention;DME/adaptive equipment instruction;Functional mobility training;Neuromuscular re-education;Pain management;Patient/family education;Psychosocial support;Stair training;Therapeutic Activities;Therapeutic Exercise;UE/LE Strength taining/ROM;UE/LE Coordination activities;Wheelchair propulsion/positioning PT Transfers Anticipated Outcome(s): mod I PT Locomotion Anticipated Outcome(s): mod I household, supervision community ambulator PT Recommendation Recommendations for Other Services: Neuropsych consult Follow Up Recommendations: Home health PT Patient destination: Home Equipment Recommended: None recommended by PT Equipment Details: Patient has [facility] and RW  Skilled Therapeutic Intervention Treatment 1: Skilled therapeutic intervention initiated after completion of evaluation. Discussed with patient falls risk, safety within room, and focus of therapy during stay. Discussed possible length of stay, goals, and follow-up therapy. Patient required min A overall without device for gait x 88 ft, functional transfers, and stair training up/down 12 stairs using 2 rails with seated rest break and fatigued quickly, requiring multiple seated rest  breaks. Patient with urinary frequency x 2 during session and stood to urinate with min A. Patient reported no dizziness during evaluation but reports dizziness  with turning and getting in and out of bed. Reviewed strategies to decrease dizziness with functional mobility and educated on fixing gaze on stationary object during transitional movements, patient verbalized understanding. Patient left sitting in wheelchair with RN present and NT notified of patient desire to return to bed after sheets put on bed.   Treatment 2: Patient received requesting to toilet, handoff from NT. Patient performed toilet transfer with close supervision and ambulated in room and to sink for hand hygiene with min A. Patient requesting to use wheelchair to get to therapy gym and instructed in propelling using BLE for strengthening and coordination with supervision and cues for technique. 10 MWT without device and min A = 0.53 m/s. Patient ambulated over uneven surface x 10 ft and picked up item from floor with min A. Patient demonstrates high fall risk as noted by score of 13/56 on Berg Balance Scale (<36= high risk for falls, close to 100%; 37-45 significant >80%; 46-51 moderate >50%; 52-55 lower >25%), patient educated on risk for falling and verbalized understanding. Patient c/o nausea with head turns and turning in a circle to the right. Instructed in 5TSS with BUE support = 33 seconds. Patient fatigued throughout session and required frequent seated rest breaks. Patient ambulated from gym back to room with one seated rest break near ADL apartment with min guard and demonstrated more fluid reciprocal gait pattern with decreased ataxia noted. Patient requested to return to bed, repositioned with verbal cues to push with BLE in hooklying and use rails to pull himself up in bed and left supine with all needs within reach and bed alarm on.   PT Evaluation Precautions/Restrictions Precautions Precautions: Fall Precaution Comments:  hx of R facial paralysis Restrictions Weight Bearing Restrictions: No General PT Amount of Missed Time (min): 15 Minutes PT Missed Treatment Reason: Patient fatigue  Pain Pain Assessment Pain Assessment: No/denies pain Home Living/Prior Functioning Home Living Available Help at Discharge: Personal care attendant;Other (Comment);Family Type of Home: House Home Access: Stairs to enter [employer] of Steps: 3 Entrance Stairs-Rails: Right Home Layout: Able to live on main level with bedroom/bathroom;Two level Bathroom Shower/Tub: Walk-in shower;Door Bathroom Accessibility: Yes Additional Comments: hiring friend to stay 24/7, 4 days a week and then son and sister plan to help out other days  Lives With: Spouse (requires 24/7 supervision) Prior Function Level of Independence: Independent with homemaking with ambulation;Independent with basic ADLs;Independent with gait;Independent with transfers;Requires assistive device for independence  Able to Take Stairs?: Yes Driving: Yes Leisure: Hobbies-yes (Comment) Comments: recent falls began due to imbalance after Oct 16 acoustic neuroma surgery; began using RW and has not fallen when using RW, enjoys watching History channel and the news Vision/Perception   No changes from baseline  Cognition Overall Cognitive Status: Within Functional Limits for tasks assessed Arousal/Alertness: Awake/alert Orientation Level: Oriented X4 Attention: Selective Selective Attention: Appears intact Memory: Impaired Memory Impairment: Decreased recall of new information Awareness: Appears intact Problem Solving: Appears intact Safety/Judgment: Appears intact Sensation Sensation Light Touch: Impaired Detail (Simultaneous filing. User may not have seen previous data.) Light Touch Impaired Details: Impaired RLE;Impaired LLE Stereognosis: Appears Intact Hot/Cold: Appears Intact (Simultaneous filing. User may not have seen previous  data.) Proprioception: Appears Intact Additional Comments: decreased sensation to LT left thigh/knee, impaired to absent in B ankles and feet Coordination Gross Motor Movements are Fluid and Coordinated: No (Simultaneous filing. User may not have seen previous data.) Fine Motor Movements are Fluid and Coordinated: Yes (Simultaneous filing. User may not have  seen previous data.) Coordination and Movement Description: left limb ataxia noted with Fine motor tasks Heel Shin Test: WFL RLE, impaired coordination and weakness LLE Motor  Motor Motor: Abnormal postural alignment and control Motor - Skilled Clinical Observations: poor balance; generalized weakness  Mobility Bed Mobility Bed Mobility: Supine to Sit;Sitting - Scoot to Edge of Bed Supine to Sit: 5: Supervision Sitting - Scoot to [employer] of Bed: 5: Supervision Transfers Sit to Stand: 4: Min assist Stand to Sit: 4: Min assist Locomotion  Ambulation Ambulation: Yes Ambulation/Gait Assistance: 4: Min assist Ambulation Distance (Feet): 88 Feet Assistive device: None Gait Gait: Yes Gait Pattern: Impaired Gait Pattern: Step-through pattern;Decreased stride length;Trunk flexed Stairs / Additional Locomotion Stairs: Yes Stairs Assistance: 4: Min assist Stair Management Technique: Two rails;Forwards;Step to pattern Number of Stairs: 12 Height of Stairs: 3 Ramp: Not tested (comment) Curb: Not tested (comment) Architect: Yes Wheelchair Assistance: 5: Supervision Wheelchair Assistance Details: Verbal cues for sequencing;Verbal cues for Marketing executive: Both upper extremities Wheelchair Parts Management: Needs assistance Distance: 50 ft  Trunk/Postural Assessment  Cervical Assessment Cervical Assessment: Within Functional Limits Thoracic Assessment Thoracic Assessment: Within Functional Limits Lumbar Assessment Lumbar Assessment: Within Functional Limits Postural Control Postural  Control: Deficits on evaluation Protective Responses: delayed/impaired  Balance Balance Balance Assessed: Yes Standardized Balance Assessment Standardized Balance Assessment: Berg Balance Test Berg Balance Test Sit to Stand: Able to stand  independently using hands Standing Unsupported: Unable to stand 30 seconds unassisted Sitting with Back Unsupported but Feet Supported on Floor or Stool: Able to sit safely and securely 2 minutes Stand to Sit: Uses backs of legs against chair to control descent Transfers: Needs one person to assist Standing Unsupported with Eyes Closed: Needs help to keep from falling Standing Ubsupported with Feet Together: Needs help to attain position but able to stand for 30 seconds with feet together From Standing, Reach Forward with Outstretched Arm: Reaches forward but needs supervision From Standing Position, Pick up Object from Floor: Unable to try/needs assist to keep balance From Standing Position, Turn to Look Behind Over each Shoulder: Needs assist to keep from losing balance and falling Turn 360 Degrees: Needs close supervision or verbal cueing Standing Unsupported, Alternately Place Feet on Step/Stool: Needs assistance to keep from falling or unable to try Standing Unsupported, One Foot in Front: Loses balance while stepping or standing Standing on One Leg: Unable to try or needs assist to prevent fall Total Score: 13 Dynamic Sitting Balance Dynamic Sitting - Balance Support: During functional activity Dynamic Sitting - Level of Assistance: 3: Mod assist Dynamic Sitting - Balance Activities: Reaching across midline;Trunk control activities;Reaching for objects Static Standing Balance Static Standing - Balance Support: During functional activity Static Standing - Level of Assistance: 4: Min assist Dynamic Standing Balance Dynamic Standing - Level of Assistance: 3: Mod assist Dynamic Standing - Balance Activities: Reaching for objects;Reaching across  midline Extremity Assessment  RUE Assessment RUE Assessment: Within Functional Limits LUE Assessment LUE Assessment: Within Functional Limits (4/5) RLE Assessment RLE Assessment: Within Functional Limits LLE Assessment LLE Assessment: Exceptions to [facility] LLE Strength LLE Overall Strength: Deficits LLE Overall Strength Comments: grossly 4 to 5/5 except hip flexion 3+/5   See Function Navigator for Current Functional Status.   Refer to Care Plan for Long Term Goals  Recommendations for other services: Neuropsych  Discharge Criteria: Patient will be discharged from PT if patient refuses treatment 3 consecutive times without medical reason, if treatment goals not met, if there is a  change in medical status, if patient makes no progress towards goals or if patient is discharged from hospital.  The above assessment, treatment plan, treatment alternatives and goals were discussed and mutually agreed upon: by patient  [name] 09/27/2015, 12:06 PM

## 2015-09-28 ENCOUNTER — Inpatient Hospital Stay (HOSPITAL_COMMUNITY): Payer: Medicare Other

## 2015-09-28 ENCOUNTER — Inpatient Hospital Stay (HOSPITAL_COMMUNITY): Payer: Medicare Other | Admitting: Physical Therapy

## 2015-09-28 LAB — GLUCOSE, CAPILLARY
GLUCOSE-CAPILLARY: 85 mg/dL (ref 65–99)
GLUCOSE-CAPILLARY: 88 mg/dL (ref 65–99)
GLUCOSE-CAPILLARY: 89 mg/dL (ref 65–99)
Glucose-Capillary: 107 mg/dL — ABNORMAL HIGH (ref 65–99)

## 2015-09-28 MED ORDER — GLUCERNA SHAKE PO LIQD: 237.0000 mL | Freq: Three times a day (TID) | ORAL | Status: DC

## 2015-09-28 MED ORDER — GLUCERNA SHAKE PO LIQD
237.0000 mL | Freq: Two times a day (BID) | ORAL | Status: DC
  Administered 2015-09-29 – 2015-10-08 (×4): 237 mL via ORAL

## 2015-09-28 NOTE — Progress Notes (Signed)
Occupational Therapy Session Note  Patient Details  Name: LENNART FERGEN MRN: [id number] Date of Birth: 08-13-1941  Today's Date: 09/28/2015 OT Individual Time: 1130-1200 OT Individual Time Calculation (min): 30 min    Short Term Goals: Week 1:  OT Short Term Goal 1 (Week 1): Pt will transfer into shower stall with min A  OT Short Term Goal 2 (Week 1): Pt will self correct LOB with min A 50% OT Short Term Goal 3 (Week 1): Pt will perform 3 grooming tasks in standing with supervision.  Skilled Therapeutic Interventions/Progress Updates: Therapeutic activity with focus on improved awareness, NMR of LUE, visual scanning, and dynamic standing balance.   Pt received supine in bed, alert and receptive for treatment although reporting fatigue from prior therapy.    Pt ambulated to central ward SLP clinic with continuous steadying assist and mod vc for gait d/t scissor gait and left lateral inattention (colliding with objects along wall).    Pt educated on use of Dynavision system as therapeutic activity to challenge balance, gross motor control, attention, and improve awareness of suspected deficit of left extremity ataxia.   Pt completed test "A", limited to 30 seconds using right hand followed by repeat of activity using left hand.   Both tests reveal mild delay, <1 second, with attentending to and reacting to right lower quadrant stimuli.   Right hand remains dominant with only mild delay at left compared to right (again less than 1 second).   No significant ataxia observed during this task using left UE.   Pt ambulated back to room and recovered to bed with standby assist.   Call light placed within reach and bed alarm activated.        Therapy Documentation Precautions:  Precautions Precautions: Fall Precaution Comments: hx of R facial paralysis Restrictions Weight Bearing Restrictions: No  Vital Signs: Therapy Vitals Pulse Rate: (!) 111 BP: 112/65 mmHg Patient Position (if appropriate):  Sitting Oxygen Therapy SpO2: 99 % O2 Device: Not Delivered  Pain: Pain Assessment Pain Assessment: No/denies pain  See Function Navigator for Current Functional Status.   Therapy/Group: Individual Therapy  [place] 09/28/2015, 12:37 PM

## 2015-09-28 NOTE — Care Management Note (Signed)
[facility] Individual Statement of Services  Patient Name:  Tommy Weeks  Date:  09/28/2015  Welcome to the [facility].  Our goal is to provide you with an individualized program based on your diagnosis and situation, designed to meet your specific needs.  With this comprehensive rehabilitation program, you will be expected to participate in at least 3 hours of rehabilitation therapies Monday-Friday, with modified therapy programming on the weekends.  Your rehabilitation program will include the following services:  Physical Therapy (PT), Occupational Therapy (OT), Speech Therapy (ST), 24 hour per day rehabilitation nursing, Therapeutic Recreaction (TR), Neuropsychology, Case Management (Social Worker), Rehabilitation Medicine, Nutrition Services and Pharmacy Services  Weekly team conferences will be held on Tuesdays to discuss your progress.  Your Social Worker will talk with you frequently to get your input and to update you on team discussions.  Team conferences with you and your family in attendance may also be held.  Expected length of stay: 10-14 days  Overall anticipated outcome: modified independent  Depending on your progress and recovery, your program may change. Your Social Worker will coordinate services and will keep you informed of any changes. Your Social Worker's name and contact numbers are listed  below.  The following services may also be recommended but are not provided by the [facility] will be made to provide these services after discharge if needed.  Arrangements include referral to agencies that provide these services.  Your insurance has been verified to be:  Medicare and [employer] Your primary doctor is:  Dr. Jani Gravel  Pertinent information will be shared with your doctor and your  insurance company.  Social Worker:  [place], [place] or ([phone]   Information discussed with and copy given to patient by: Lennart Pall, 09/28/2015, 4:07 PM

## 2015-09-28 NOTE — Progress Notes (Signed)
[facility] PHYSICAL MEDICINE & REHABILITATION     PROGRESS NOTE    Subjective/Complaints: No new complaints. Therapy went well. Denies pain. Balance still an issue.   ROS: Pt denies fever, rash/itching, headache, blurred or double vision, nausea, vomiting, abdominal pain, diarrhea, chest pain, shortness of breath, palpitations, dysuria, dizziness, neck or back pain, bleeding, anxiety, or depression  Objective: Vital Signs: Blood pressure 94/63, pulse 111, temperature 98.4 F (36.9 C), temperature source Oral, resp. rate 19, height 6' (1.829 m), weight 61.236 kg (135 lb), SpO2 99 %. No results found.  Recent Labs  09/26/15 1908  WBC 6.1  HGB 11.1*  HCT 35.5*  PLT 345    Recent Labs  09/26/15 0327 09/26/15 1908  NA 136 134*  K 3.4* 4.2  CL 100* 96*  GLUCOSE 103* 131*  BUN 8 10  CREATININE 0.77 0.99  CALCIUM 8.4* 8.8*   CBG (last 3)   Recent Labs  09/27/15 1711 09/27/15 2105 09/28/15 0633  GLUCAP 100* 114* 85    Wt Readings from Last 3 Encounters:  09/26/15 61.236 kg (135 lb)  09/26/15 63.594 kg (140 lb 3.2 oz)  08/06/15 67.268 kg (148 lb 4.8 oz)    Physical Exam:  Constitutional: He is oriented to person, place, and time.  HENT:  OD with decreased conjunctival irritation---no drainage Normocephalic. Atraumatic Eyes: EOM and Conj are normal.  Neck: Normal range of motion. Neck supple. No thyromegaly present.  Cardiovascular: Irregular irregular , rate controlled Respiratory: Effort normal and breath sounds normal. No respiratory distress.  GI: Soft. Bowel sounds are normal. He exhibits no distension.  Neurological: He is alert and oriented to person, place, and time. He exhibits normal muscle tone.  Follows full commands.  Good insight and awareness.  Right facial weakness. No hearing on right.  B/L UE: 4/5 proximal to distal B/L LE: 3 to 3+/5 Hip flexion, 4/5 knee extension, ankle dorsi/plantar flexion Decreased sensation to light touch  distal LE below the knees bilaterally 3+ DTR RLE and RUE. Skin: Skin is warm and dry. A few small abrasions noted Psychiatric: He has a normal mood and affect. His behavior is normal   Assessment/Plan: 1. 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 Physiatrist is providing close team supervision and 24 hour management of active medical problems listed below. Physiatrist and rehab team continue to assess barriers to discharge/monitor patient progress toward functional and medical goals.  Function:  Bathing Bathing position   Position: Shower  Bathing parts Body parts bathed by patient: Right arm, Left arm, Chest, Abdomen, Front perineal area, Buttocks, Right upper leg, Left upper leg, Right lower leg, Left lower leg Body parts bathed by helper: Back  Bathing assist Assist Level: Touching or steadying assistance(Pt > 75%)      Upper Body Dressing/Undressing Upper body dressing   What is the patient wearing?: Pull over shirt/dress     Pull over shirt/dress - Perfomed by patient: Thread/unthread right sleeve, Thread/unthread left sleeve, Put head through opening, Pull shirt over trunk          Upper body assist Assist Level: Set up   Set up : To obtain clothing/put away  Lower Body Dressing/Undressing Lower body dressing   What is the patient wearing?: Pants, Non-skid slipper socks, Shoes     Pants- Performed by patient: Thread/unthread right pants leg, Thread/unthread left pants leg, Pull pants up/down   Non-skid slipper  socks- Performed by patient: Don/doff right sock, Don/doff left sock (doff)       Shoes - Performed by patient: Don/doff right shoe, Don/doff left shoe            Lower body assist Assist for lower body dressing: Touching or steadying assistance (Pt > 75%)      Toileting Toileting   Toileting steps completed by patient:  Adjust clothing prior to toileting, Adjust clothing after toileting Toileting steps completed by helper: Performs perineal hygiene Toileting Assistive Devices: Grab bar or rail  Toileting assist Assist level: Touching or steadying assistance (Pt.75%)   Transfers Chair/bed transfer   Chair/bed transfer method: Ambulatory Chair/bed transfer assist level: Touching or steadying assistance (Pt > 75%)       Locomotion Ambulation     Max distance: 90 Assist level: Touching or steadying assistance (Pt > 75%)   Wheelchair   Type: Manual Max wheelchair distance: 100 Assist Level: Supervision or verbal cues  Cognition Comprehension Comprehension assist level: Understands basic 90% of the time/cues < 10% of the time  Expression Expression assist level: Expresses basic 90% of the time/requires cueing < 10% of the time.  Social Interaction       Social Interaction assist level: Interacts appropriately with others with medication or extra time (anti-anxiety, antidepressant).  Problem Solving Problem solving assist level: Solves basic 90% of the time/requires cueing < 10% of the time  Memory Memory assist level: Recognizes or recalls 90% of the time/requires cueing < 10% of the time   Medical Problem List and Plan: 1. Weakness secondary to SDH after fall,Hx right acoustic neuroma  -continue CIR therapies. Mod I goals 2. DVT Prophylaxis/Anticoagulation: SCD 3. Pain Management: Tylenol as needed 4. Mood: Ativan 0.5 mg every 6 hours as needed, Wellbutrin 100 mg twice a day, 5. Neuropsych: This patient is capable of making decisions on his own behalf. 6. Skin/Wound Care: Routine skin checks. Skin intact 7. Fluids/Electrolytes/Nutrition: encourage po. Good intake at present 8. History of atrial fibrillation. Chronic Coumadin discontinued secondary to subdural hematoma. Continue Cardizem 240 mg daily. Cardiac rate controlled at present 9. Hypertension. Toprol 25 mg daily, Norvasc 5 mg  daily. 10. Diabetes mellitus with peripheral neuropathy. Hemoglobin A1c 5.5. Sugars under control.  11. Hypokalemia. Potassium level normal  12. BPH. Flomax 0.4 mg daily. Check PVRs 3   LOS (Days) 2 A FACE TO FACE EVALUATION WAS PERFORMED  SWARTZ,ZACHARY T 09/28/2015 9:11 AM

## 2015-09-28 NOTE — IPOC Note (Addendum)
Overall Plan of Care [facility]) Patient Details Name: Tommy Weeks MRN: [id number] DOB: [birth year]  Admitting Diagnosis: Traumatic R SDH  Hospital Problems: Active Problems:   Traumatic subdural hematoma (HCC)   Anxiety and depression   Paroxysmal atrial fibrillation (HCC)   Type 2 diabetes mellitus with diabetic neuropathy (HCC)   Traumatic subdural hematoma without loss of consciousness ([place])     Functional Problem List: Nursing Bladder, Bowel, Endurance, Medication Management, Motor, Nutrition, Safety, Perception, Sensory, Skin Integrity  PT Balance, Endurance, Motor, Nutrition, Pain, Sensory, Safety, Skin Integrity  OT Balance, Edema, Endurance, Motor, Nutrition, Pain, Safety, Sensory, Skin Integrity, Cognition  SLP    TR         Basic ADL's: OT Eating, Grooming, Bathing, Dressing, Toileting     Advanced  ADL's: OT Simple Meal Preparation     Transfers: PT Bed Mobility, Bed to Chair, Car, [employer], Floor  OT Toilet, Metallurgist: PT Ambulation, Emergency planning/management officer, Stairs     Additional Impairments: OT None  SLP        TR      Anticipated Outcomes Item Anticipated Outcome  Self Feeding mod I   Swallowing      Basic self-care  mod I   Toileting  mod I   Bathroom Transfers mod I  Bowel/Bladder  manage bowel and bladder with mod I assist  Transfers  mod I  Locomotion  mod I household, supervision community ambulator  Communication     Cognition     Pain  n/a  Safety/Judgment  maintain safety with level 5 assist/supervision/cues/reminders   Therapy Plan: PT Intensity: Minimum of 1-2 x/day ,45 to 90 minutes PT Frequency: 5 out of 7 days PT Duration Estimated Length of Stay: 10-14 days OT Intensity: Minimum of 1-2 x/day, 45 to 90 minutes OT Frequency: 5 out of 7 days OT Duration/Estimated Length of Stay: 10-14 days         Team Interventions: Nursing Interventions Patient/Family Education, Bowel Management, Cognitive  Remediation/Compensation, Disease Management/Prevention, Discharge Planning, Psychosocial Support, Medication Management  PT interventions Ambulation/gait training, Training and development officer, Community reintegration, Discharge planning, Disease management/prevention, DME/adaptive equipment instruction, Functional mobility training, Neuromuscular re-education, Pain management, Patient/family education, Psychosocial support, Stair training, Therapeutic Activities, Therapeutic Exercise, UE/LE Strength taining/ROM, UE/LE Coordination activities, Wheelchair propulsion/positioning  OT Interventions Training and development officer, Cognitive remediation/compensation, Academic librarian, Discharge planning, Engineer, drilling, Disease mangement/prevention, Neuromuscular re-education, Patient/family education, Self Care/advanced ADL retraining, Splinting/orthotics, Therapeutic Exercise, UE/LE Coordination activities, UE/LE Strength taining/ROM, Therapeutic Activities, Skin care/wound managment, Psychosocial support, Pain management, Functional mobility training  SLP Interventions    TR Interventions    SW/CM Interventions Discharge Planning, Psychosocial Support, Patient/Family Education    Team Discharge Planning: Destination: PT-Home ,OT- Home , SLP-  Projected Follow-up: PT-Home health PT, OT-  Home health OT, SLP-  Projected Equipment Needs: PT-None recommended by PT, OT- To be determined, SLP-  Equipment Details: PT-Patient has SPC and RW, OT-  Patient/family involved in discharge planning: PT- Patient,  OT-Patient, SLP-   MD ELOS: 10-14 days Medical Rehab Prognosis:  Excellent Assessment: The patient has been admitted for CIR therapies with the diagnosis of TBI. The team will be addressing functional mobility, strength, stamina, balance, safety, adaptive techniques and equipment, self-care, bowel and bladder mgt, patient and caregiver education, NMR, visual perceptual rx, vestibular  rx, community reintegration, ego support. Goals have been set at mod I for mobility and self-care.    Meredith Staggers, MD, Mellody Drown  See Team Conference Notes for weekly updates to the plan of care

## 2015-09-28 NOTE — Progress Notes (Signed)
Physical Therapy Session Note  Patient Details  Name: Tommy Weeks MRN: [id number] Date of Birth: 1941-10-04  Today's Date: 09/28/2015 PT Individual Time: 0800-0900 and 1530-1630 PT Individual Time Calculation (min): 60 min and 60 min   Short Term Goals: Week 1:  PT Short Term Goal 1 (Week 1): Patient will ambulate 150 ft with supervision.  PT Short Term Goal 2 (Week 1): Patient will transfer bed <> wheelchair with supervision.  PT Short Term Goal 3 (Week 1): Patient will maintain standing balance during functional task x 3 min with supervision.  PT Short Term Goal 4 (Week 1): Patient will perform floor transfer with min A.   Skilled Therapeutic Interventions/Progress Updates:   Treatment 1: Patient in bed with son present initially then departing. Son confirmed that patient will have 24/7 supervision upon discharge if needed. Patient requesting to toilet, donned shoes sitting edge of bed and ambulated in room with min A and no device, steady assist for toilet transfer and clothing management and total A for hygiene. Patient stood at sink with UE support as needed for hand hygiene, to wash face, and brush teeth with close supervision-min guard. After seated rest break, patient ambulated x 90 ft + 50 ft without device and min A with cues for forward gaze. For active rest break, seated LAQ and hip flexion x 10 each LE. To challenge dynamic standing balance and coordination, performed standing alternating step taps to 4" step 2 x 10 with min-mod A for balance, verbal/visual cues for widening BOS and slower movement to increase control and maintain balance. Stair training up/down 8 (6") stairs using 2 rails with reciprocal pattern and min A progressed to mod A due to fatigue with cues for decreasing speed due to safety concerns. Vitals assessed, see below. Patient propelled wheelchair back to room using BUE with supervision and left semi reclined in bed with all needs within reach. Patient required  frequent seated rest breaks throughout session due to fatigue and feeling "wiped out" after yesterday. Discussed with OT and patient's POC changed to 3.5 hours from 4.5 hours to improve tolerance to therapy.   Treatment 2: Patient received in bed with wife and son present for session. Patient sat edge of bed and donned shoes with supervision. Gait without AD to/from gym 2 x 130 ft with min guard-assist and cues for wider BOS. Standing therex with BUE support: heel raises x 10, squats x 10 with max multimodal cues for technique and min A, standing marching 2 x 10 with seated rest between, standing knee flexion x 10 each LE with max multimodal cues to prevent hip flexion to isolate hamstring activation and cues for upright posture with all exercises. Sitting balance, BUE/core strengthening, and activity tolerance patient hitting beach ball in all directions using 2# dowel rod x 10, 15, and 20 consecutively with seated rest between trials. Stair training up/down 8 (6") stairs using 2 rails with reciprocal pattern ascending and step-to pattern descending with cues to bring hands forward on rails and ensure foot is fully on step with min A. Patient stood to complete 2 pipeline tree puzzles x 3 min + 3 min 45 seconds with supervision and 1 LOB requiring mod A to recover and mod-max multimodal cues for error recognition and correction with seated rest between trials. Patient fatigued and requested to return to room, left sitting edge of bed with family present.    Therapy Documentation Precautions:  Precautions Precautions: Fall Precaution Comments: hx of R facial paralysis Restrictions  Weight Bearing Restrictions: No Vital Signs: Therapy Vitals Temp: 98.4 F (36.9 C) Temp Source: Oral Pulse Rate: (!) 111 Resp: 19 BP: 94/63 mmHg Patient Position (if appropriate): Sitting Oxygen Therapy SpO2: 99 % O2 Device: Not Delivered Pain: Pain Assessment Pain Assessment: No/denies pain   See Function  Navigator for Current Functional Status.   Therapy/Group: Individual Therapy  [name] 09/28/2015, 9:01 AM

## 2015-09-28 NOTE — Progress Notes (Signed)
Occupational Therapy Note  Patient Details  Name: Tommy Weeks MRN: [id number] Date of Birth: [birth year]  Today's Date: 09/28/2015 OT Individual Time: 1400-1430 OT Individual Time Calculation (min): 30 min   Pt denied pain Individual therapy  Pt resting in bed upon arrival and requested to use bathroom prior to amb with HHA to therapy gym.  Pt required min A/steady A for all amb during session.  Pt engaged in dynamic standing tasks on compliant and noncompliant surfaces.  Focus on increased standing balance to increase independence with BADLs and IADLs.   Leotis Shames [facility] 09/28/2015, 2:40 PM

## 2015-09-28 NOTE — Plan of Care (Signed)
Pt's plan of care adjusted to 3.5 hours from 4.5 hours after discussion with medical staff and treatment team as pt currently unable to tolerate current therapy schedule with OT and PT.

## 2015-09-28 NOTE — Progress Notes (Signed)
Social Work  Social Work Assessment and Plan  Patient Details  Name: Tommy Weeks MRN: [id number] Date of Birth: June 17, 1941  Today's Date: 09/28/2015  Problem List:  Patient Active Problem List   Diagnosis Date Noted  . Frequent falls 09/26/2015  . Atrial fibrillation with RVR ([place]) 09/26/2015  . Essential hypertension 09/26/2015  . Hypokalemia 09/26/2015  . Hypomagnesemia 09/26/2015  . BPH (benign prostatic hyperplasia) 09/26/2015  . Controlled diabetes mellitus ([place]) 09/26/2015  . Traumatic subdural hematoma ([place]) 09/26/2015  . Anxiety and depression   . Paroxysmal atrial fibrillation (HCC)   . Type 2 diabetes mellitus with diabetic neuropathy ([place])   . Weakness   . Warfarin-induced coagulopathy ([place]) 09/23/2015  . SDH (subdural hematoma) ([place]) 09/23/2015  . Right acoustic neuroma ([place]) 08/06/2015  . Protein-calorie malnutrition, severe ([place]) 07/06/2015  . Atrial fibrillation with rapid ventricular response ([place]) 07/04/2015  . Urinary retention 04/04/2013  . Atrial fibrillation ([place]) 03/31/2013  . Diabetes mellitus ([place]) 03/31/2013  . Hypertension 03/31/2013  . Abdominal or pelvic swelling, mass, or lump, other specified site 03/07/2013   Past Medical History:  Past Medical History  Diagnosis Date  . Hypertension   . 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 Anxiety   . Depression   . 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 Prostate cancer ([place])   . History of urinary retention   . History of prostatitis   . Chronic atrial fibrillation ([place])   . Dysrhythmia   . Type 2 diabetes mellitus (HCC)     NIDDM  . Complication of anesthesia     urinary retention  . Hyperlipidemia   . Neuromuscular disorder ([place])     imbalance s/p craniectomy  . Prostate cancer [facility])    Past Surgical History:   Past Surgical History  Procedure Laterality Date  . Cholecystectomy  90's  . Retroperitoneal mass excision  03/31/2013  . Laparotomy N/A 03/31/2013    Procedure: Open resection of abdominal mass;  Surgeon: Stark Klein, MD;  Location: [place];  Service: General;  Laterality: N/A;  . Cardiac catheterization  1990  . Inguinal hernia repair Bilateral 05/24/2013    Procedure: bilateral open HERNIA REPAIR INGUINAL ADULT;  Surgeon: Gayland Curry, MD;  Location: [facility];  Service: General;  Laterality: Bilateral;  . Insertion of mesh Bilateral 05/24/2013    Procedure: INSERTION OF MESH;  Surgeon: Gayland Curry, MD;  Location: [facility];  Service: General;  Laterality: Bilateral;  . Umbilical hernia repair  1990's  . Craniectomy for excision of acoustic neuroma Right 02-12-2001  . Hydrocele excision Right 11-26-2007  . Exploration face right side  11-29-2001  &  02-06-2003    post craniotomy resection acoustic neuroma--  facial paralysis  . Transthoracic echocardiogram  05-11-2007  (per PCP note)    ef 55-65%,  mild TR and MR  . Eye surgery Bilateral     cataract OU w/ IOL  . Transrectal ultrasound N/A 06/11/2015    Procedure: TRANSRECTAL ULTRASOUND AND BIOPSY;  Surgeon: Kathie Rhodes, MD;  [facility];  Service: Urology;  Laterality: N/A;   Social History:  reports that he has never smoked. He has never used smokeless tobacco. He reports that he does not drink alcohol or use illicit drugs.  Family / Support Systems Marital Status: Married Patient Roles: Spouse, Parent, Caregiver Spouse/Significant Other: wife, Baker Janus, @ [facility]) [phone] - with dementia and has 24/7 supervision Children: 3 adult sons, Gaspar Bidding @ (C) [phone];  Randall Hiss ([place]) @ (C) [phone] and Lennette Bihari [facility]) Other Supports: pt's sister, Dixon Boos @ [phone] Anticipated Caregiver: Son, sister, and hired caregiver Ability/Limitations of Caregiver: Sister works, son works, has a hired caregiver for his wife  and she will be there for patient as well. Caregiver Availability: 24/7 Family Dynamics: Pt describes family as very supportive  Social History Preferred language: English Religion: Quaker Cultural Background: NA Read: Yes Write: Yes Employment Status: Retired Freight forwarder Issues: None Guardian/Conservator: None - per MD, pt capable of making decisions on his own behalf   Abuse/Neglect Physical Abuse: Denies Verbal Abuse: Denies Sexual Abuse: Denies Exploitation of patient/patient's resources: Denies Self-Neglect: Denies  Emotional Status Pt's affect, behavior adn adjustment status: Pt very pleasant and completes assessment interview without any difficulty.  Denies any significant s/s of emotional distress, but does admit that he worries about his wife while he is away.  He is confident she is being well cared for but he is eager to get back home.  No formal depression screen indicated at this time, but have referred for neuropsychology to eval. Recent Psychosocial Issues: Pt has been the primary caregiver for his wife for several years Pyschiatric History: None Substance Abuse History: None  Patient / Family Perceptions, Expectations & Goals Pt/Family understanding of illness & functional limitations: Pt and family with good understanding of his SDH and current functional limitations/ need for CIR. Premorbid pt/family roles/activities: As ntoed, pt was primary caregiver for his wife. Anticipated changes in roles/activities/participation: Pt hopeful he can resume some level of care for his wife but is realistic that family and private duty caregivers are needed for assist as well. Pt/family expectations/goals: "I feel like I'm making progress.Marland KitchenMarland KitchenHope there will be more."  [employer]: None Premorbid Home Care/DME Agencies: None Transportation available at discharge: yes Resource referrals recommended: Neuropsychology  Discharge  Planning Living Arrangements: Spouse/significant other Support Systems: Children, Other relatives, Water engineer, Home care staff Type of Residence: Private residence Insurance Resources: [employer], Multimedia programmer (specify) Human resources officer) Museum/gallery curator Resources: Radio broadcast assistant Screen Referred: No Living Expenses: Own Money Management: Patient Does the patient have any problems obtaining your medications?: No Home Management: Pt PTA Patient/Family Preliminary Plans: Pt to return home with his wife and has private help and family to assist as well Social Work Anticipated Follow Up Needs: HH/OP Expected length of stay: 10-14 days  Clinical Impression Very pleasant gentleman here following a traumatic SDH and making excellent gains.  Of note, he is primary caregiver to his wife who suffers with dementia (physically independent).  Pt and family are realistic that they and will need to provide support to both parents at d/c and are able to do this.  Pt denies any significant s/s of emotional distress, however, will monitor and have already referred for neuropsychology support.  Will follow for d/c planning and support needs.  Casandra Dallaire 09/28/2015, 4:40 PM

## 2015-09-28 NOTE — Progress Notes (Signed)
Occupational Therapy Session Note  Patient Details  Name: Tommy Weeks MRN: [id number] Date of Birth: Jun 15, 1941  Today's Date: 09/28/2015 OT Individual Time: 1000-1100 OT Individual Time Calculation (min): 60 min    Short Term Goals: Week 1:  OT Short Term Goal 1 (Week 1): Pt will transfer into shower stall with min A  OT Short Term Goal 2 (Week 1): Pt will self correct LOB with min A 50% OT Short Term Goal 3 (Week 1): Pt will perform 3 grooming tasks in standing with supervision.  Skilled Therapeutic Interventions/Progress Updates:    Pt resting in bed upon arrival.  Pt engaged in BADL retraining including bathing and dressing while standing at sink.  Pt sat in w/c to remove pants over feet and thread pants before standing to pull up.  Pt completed remainder of BADLs while standing at sink with close supervision and occasional steady A. Pt requested to use bathroom and amb with steady A to bathroom.  Pt transitioned to therapy gym and engaged in BUE therex on [employer] (level 2 for 10 mins).  Pt required rest breaks X 3 during 10 workout.  Pt amb with HHA back to room and returned to bed awaiting next therapy. Pt fatigues quickly requiring multiple rest breaks during activities.    Therapy Documentation Precautions:  Precautions Precautions: Fall Precaution Comments: hx of R facial paralysis Restrictions Weight Bearing Restrictions: No Pain: Pain Assessment Pain Assessment: No/denies pain  See Function Navigator for Current Functional Status.   Therapy/Group: Individual Therapy  Leroy Libman 09/28/2015, 11:04 AM

## 2015-09-29 ENCOUNTER — Inpatient Hospital Stay (HOSPITAL_COMMUNITY): Payer: Medicare Other | Admitting: Occupational Therapy

## 2015-09-29 ENCOUNTER — Inpatient Hospital Stay (HOSPITAL_COMMUNITY): Payer: Medicare Other | Admitting: Physical Therapy

## 2015-09-29 LAB — GLUCOSE, CAPILLARY
GLUCOSE-CAPILLARY: 87 mg/dL (ref 65–99)
Glucose-Capillary: 100 mg/dL — ABNORMAL HIGH (ref 65–99)
Glucose-Capillary: 145 mg/dL — ABNORMAL HIGH (ref 65–99)
Glucose-Capillary: 86 mg/dL (ref 65–99)

## 2015-09-29 NOTE — Progress Notes (Signed)
[place] PHYSICAL MEDICINE & REHABILITATION     PROGRESS NOTE    Subjective/Complaints: Has had tingling and numbness both feet for several mo  ROS: Pt denies fever, rash/itching, headache, blurred or double vision, nausea, vomiting, abdominal pain, diarrhea, chest pain, shortness of breath, palpitations, dysuria, dizziness, neck or back pain, bleeding, anxiety, or depression  Objective: Vital Signs: Blood pressure 101/63, pulse 112, temperature 98.4 F (36.9 C), temperature source Oral, resp. rate 18, height 6' (1.829 m), weight 61.236 kg (135 lb), SpO2 96 %. No results found.  Recent Labs  09/26/15 1908  WBC 6.1  HGB 11.1*  HCT 35.5*  PLT 345    Recent Labs  09/26/15 1908  NA 134*  K 4.2  CL 96*  GLUCOSE 131*  BUN 10  CREATININE 0.99  CALCIUM 8.8*   CBG (last 3)   Recent Labs  09/28/15 1637 09/28/15 2110 09/29/15 0654  GLUCAP 107* 89 87    Wt Readings from Last 3 Encounters:  09/26/15 61.236 kg (135 lb)  09/26/15 63.594 kg (140 lb 3.2 oz)  08/06/15 67.268 kg (148 lb 4.8 oz)    Physical Exam:  Constitutional: He is oriented to person, place, and time.  HENT:  OD with decreased conjunctival irritation---no drainage Normocephalic. Atraumatic Eyes: EOM and Conj are normal.  Neck: Normal range of motion. Neck supple. No thyromegaly present.  Cardiovascular: Irregular irregular , rate controlled Respiratory: Effort normal and breath sounds normal. No respiratory distress.  GI: Soft. Bowel sounds are normal. He exhibits no distension.  Neurological: He is alert and oriented to person, place, and time. He exhibits normal muscle tone.  Follows full commands.  Good insight and awareness.  Right facial weakness. No hearing on right.  B/L UE: 4/5 proximal to distal B/L LE: 3 to 3+/5 Hip flexion, 4/5 knee extension, ankle dorsi/plantar flexion Decreased sensation to light touch distal LE below the knees bilaterally 3+ DTR RLE and RUE. Skin: Skin  is warm and dry. A few small abrasions noted Psychiatric: He has a normal mood and affect. His behavior is normal   Assessment/Plan: 1. 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 Physiatrist is providing close team supervision and 24 hour management of active medical problems listed below. Physiatrist and rehab team continue to assess barriers to discharge/monitor patient progress toward functional and medical goals.  Function:  Bathing Bathing position   Position: Standing at sink  Bathing parts Body parts bathed by patient: Right arm, Left arm, Chest, Abdomen, Front perineal area, Buttocks, Right upper leg, Left upper leg, Right lower leg, Left lower leg Body parts bathed by helper: Back  Bathing assist Assist Level: Touching or steadying assistance(Pt > 75%)      Upper Body Dressing/Undressing Upper body dressing   What is the patient wearing?: Pull over shirt/dress     Pull over shirt/dress - Perfomed by patient: Thread/unthread right sleeve, Thread/unthread left sleeve, Put head through opening, Pull shirt over trunk          Upper body assist Assist Level: Set up   Set up : To obtain clothing/put away  Lower Body Dressing/Undressing Lower body dressing   What is the patient wearing?: Pants, Shoes, Socks     Pants- Performed by patient: Thread/unthread right pants leg, Thread/unthread left pants leg, Pull pants up/down   Non-skid slipper socks- Performed by patient: Don/doff right sock, Don/doff left sock  Shoes - Performed by patient: Don/doff right shoe, Don/doff left shoe            Lower body assist Assist for lower body dressing: Touching or steadying assistance (Pt > 75%)      Toileting Toileting   Toileting steps completed by patient: Adjust clothing prior to toileting, Adjust clothing after toileting, Performs perineal  hygiene Toileting steps completed by helper: Performs perineal hygiene Toileting Assistive Devices: Grab bar or rail  Toileting assist Assist level: Touching or steadying assistance (Pt.75%)   Transfers Chair/bed transfer   Chair/bed transfer method: Ambulatory Chair/bed transfer assist level: Touching or steadying assistance (Pt > 75%)       Locomotion Ambulation     Max distance: 90 Assist level: Touching or steadying assistance (Pt > 75%)   Wheelchair   Type: Manual Max wheelchair distance: 100 Assist Level: Supervision or verbal cues  Cognition Comprehension Comprehension assist level: Understands basic 90% of the time/cues < 10% of the time  Expression Expression assist level: Expresses basic 90% of the time/requires cueing < 10% of the time.  Social Interaction       Social Interaction assist level: Interacts appropriately with others with medication or extra time (anti-anxiety, antidepressant).  Problem Solving Problem solving assist level: Solves basic 90% of the time/requires cueing < 10% of the time  Memory Memory assist level: Recognizes or recalls 90% of the time/requires cueing < 10% of the time   Medical Problem List and Plan: 1. Weakness secondary to SDH after fall,was anticogulated at time of fall  Hx right acoustic neuroma  -continue CIR therapies. Mod I goals 2. DVT Prophylaxis/Anticoagulation: SCD 3. Pain Management: Tylenol as needed 4. Mood: Ativan 0.5 mg every 6 hours as needed, Wellbutrin 100 mg twice a day, 5. Neuropsych: This patient is capable of making decisions on his own behalf. 6. Skin/Wound Care: Routine skin checks. Skin intact 7. Fluids/Electrolytes/Nutrition: encourage po. Good intake at present 8. History of atrial fibrillation. Chronic Coumadin discontinued secondary to subdural hematoma. Continue Cardizem 240 mg daily. Cardiac rate controlled at present 9. Hypertension. Toprol 25 mg daily, Norvasc 5 mg daily. 10. Diabetes mellitus with  peripheral neuropathy. Hemoglobin A1c 5.5. Sugars under control. Pt states numbness due to neuroma but that doesn't appear accurate   11. Hypokalemia. Potassium level normal  12. BPH. Flomax 0.4 mg daily. Check PVRs 3   LOS (Days) 3 A FACE TO FACE EVALUATION WAS PERFORMED  KIRSTEINS,ANDREW E 09/29/2015 8:50 AM

## 2015-09-29 NOTE — Progress Notes (Signed)
Physical Therapy Session Note  Patient Details  Name: Tommy Weeks MRN: [id number] Date of Birth: [birth year]  Today's Date: 09/29/2015 PT Individual Time: 1110-1210 PT Individual Time Calculation (min): 60 min   Short Term Goals: Week 1:  PT Short Term Goal 1 (Week 1): Patient will ambulate 150 ft with supervision.  PT Short Term Goal 2 (Week 1): Patient will transfer bed <> wheelchair with supervision.  PT Short Term Goal 3 (Week 1): Patient will maintain standing balance during functional task x 3 min with supervision.  PT Short Term Goal 4 (Week 1): Patient will perform floor transfer with min A.   Skilled Therapeutic Interventions/Progress Updates:  Pt was seen bedside in the am. Pt transferred supine to edge of bed with no assist. Pt transferred sit to stand min A and verbal cues. Pt transferred to w/c stand pivot with min A and verbal cues. Pt propelled w/c about 125 feet with B UEs and S. In gym focused on NMR, utilizing cone taps, alternating cone taps ans criss cross cone taps, 3 sets x 10 reps each. Pt had posterior LOB x1 with mod A to correct. Pt ambulated 182, 25 and 125 feet with min A and verbal cues. Pt ascended/descended 12 stairs with 2 rails and min A with verbal cues. Pt returned to room and left sitting up in w/c with call bell within reach.   Therapy Documentation Precautions:  Precautions Precautions: Fall Precaution Comments: hx of R facial paralysis Restrictions Weight Bearing Restrictions: No General:   Pain: No c/o pain.   See Function Navigator for Current Functional Status.   Therapy/Group: Individual Therapy  [name], [name] 09/29/2015, 12:55 PM

## 2015-09-29 NOTE — Progress Notes (Signed)
Physical Therapy Session Note  Patient Details  Name: Tommy Weeks MRN: [id number] Date of Birth: 16-Apr-1941  Today's Date: 09/29/2015 PT Individual Time: 1415-1530 PT Individual Time Calculation (min): 75 min   Short Term Goals: Week 1:  PT Short Term Goal 1 (Week 1): Patient will ambulate 150 ft with supervision.  PT Short Term Goal 2 (Week 1): Patient will transfer bed <> wheelchair with supervision.  PT Short Term Goal 3 (Week 1): Patient will maintain standing balance during functional task x 3 min with supervision.  PT Short Term Goal 4 (Week 1): Patient will perform floor transfer with min A.   Skilled Therapeutic Interventions/Progress Updates:  Pt was seen bedside in the pm. Pt transferred supine to edge of bed with no assist. Pt performed all sit to stand and stand pivot transfers with min guard and verbal cues. Pt ambulated 120 feet x 4 without assistive device, min guard to min A with verbal cues. Pt performed toilet transfers with min guard and grab bars. Pt ambulated 180 and 215 feet with st cane and min guard with verbal cues. Pt performed slalom course 50 feet x 3 with st cane and min guard to min A with verbal cues. Pt side stepped for 10 and 25 feet, pt c/o dizziness with side stepping which subsided with rest. Pt did better with sidestepped when focusing on an object and not looking down at his feet. Pt utilized Kinetron 5 1 minute intervals, speed decreased from 30 to 50 cm/[place] secondary to increased fatigue. Pt returned to room following treatment. Pt transferred edge of bed to supine with side rail and no assist.   Therapy Documentation Precautions:  Precautions Precautions: Fall Precaution Comments: hx of R facial paralysis Restrictions Weight Bearing Restrictions: No General:   Pain: No c/o pain.   See Function Navigator for Current Functional Status.   Therapy/Group: Individual Therapy  Edris, [place] 09/29/2015, 3:31 PM

## 2015-09-29 NOTE — Progress Notes (Signed)
Occupational Therapy Session Note  Patient Details  Name: Tommy Weeks MRN: [id number] Date of Birth: 03-21-1941  Today's Date: 09/29/2015 OT Individual Time:  - 0900-1000  (60 min)      Short Term Goals: Week 1:  OT Short Term Goal 1 (Week 1): Pt will transfer into shower stall with min A  OT Short Term Goal 2 (Week 1): Pt will self correct LOB with min A 50% OT Short Term Goal 3 (Week 1): Pt will perform 3 grooming tasks in standing with supervision. Week 2:     Skilled Therapeutic Interventions/Progress Updates:    Focus of treatment was bed mobility, transfers,  Neuro-muscular reeducation, sitting balance, standing balance, attention, therapeutic activities, sustained attention, postural control . Pt went from lying to sitting with SBA.  Engaged in dynamic standing balance and reaching while gathering with one miild LOB when doing a 15 inch reach.  Explained treatment plan.  Pt decided what he wanted to to first.  Ambulates to sink and stands to do teeth and face with SBA.  Ambulates to toilet and performs BM and urine.  Ambulates to shower with min assist for LE stabilization.  Performs shower and ambulates to bed to dr ess EOB.   Pt fatigues by end of session and left lying in bed with all needs in reach.     Therapy Documentation Precautions:  Precautions Precautions: Fall Precaution Comments: hx of R facial paralysis Restrictions Weight Bearing Restrictions: No General:   Vital Signs: Therapy Vitals Pulse Rate: (!) 112 Resp: 18 BP: 101/63 mmHg Patient Position (if appropriate): Lying Oxygen Therapy SpO2: 96 % O2 Device: Not Delivered Pain:numbness and tingling in both feet             See Function Navigator for Current Functional Status.   Therapy/Group: Individual Therapy  Lisa Roca 09/29/2015, 9:12 AM

## 2015-09-30 ENCOUNTER — Inpatient Hospital Stay (HOSPITAL_COMMUNITY): Payer: Medicare Other | Admitting: Physical Therapy

## 2015-09-30 DIAGNOSIS — I4891 Unspecified atrial fibrillation: Secondary | ICD-10-CM

## 2015-09-30 LAB — GLUCOSE, CAPILLARY
GLUCOSE-CAPILLARY: 99 mg/dL (ref 65–99)
GLUCOSE-CAPILLARY: 102 mg/dL — AB (ref 65–99)
GLUCOSE-CAPILLARY: 112 mg/dL — AB (ref 65–99)
GLUCOSE-CAPILLARY: 108 mg/dL — AB (ref 65–99)

## 2015-09-30 MED ORDER — METOPROLOL SUCCINATE ER 50 MG PO TB24
50.0000 mg | ORAL_TABLET | Freq: Every day | ORAL | Status: DC
  Administered 2015-10-01 – 2015-10-08 (×8): 50 mg via ORAL
  Filled 2015-09-30 (×8): qty 1

## 2015-09-30 NOTE — Progress Notes (Signed)
[place] PHYSICAL MEDICINE & REHABILITATION     PROGRESS NOTE    Subjective/Complaints: D/w RN, tachycardia, no CP  ROS: Pt denies fever, rash/itching, headache, blurred or double vision, nausea, vomiting, abdominal pain, diarrhea, chest pain, shortness of breath, palpitations, dysuria, dizziness, neck or back pain, bleeding, anxiety, or depression  Objective: Vital Signs: Blood pressure 132/62, pulse 118, temperature 99.2 F (37.3 C), temperature source Oral, resp. rate 18, height 6' (1.829 m), weight 61.236 kg (135 lb), SpO2 98 %. No results found. No results for input(s): WBC, HGB, HCT, PLT in the last 72 hours. No results for input(s): NA, K, CL, GLUCOSE, BUN, CREATININE, CALCIUM in the last 72 hours.  Invalid input(s): CO CBG (last 3)   Recent Labs  09/29/15 1628 09/29/15 2101 09/30/15 0654  GLUCAP 145* 86 99    Wt Readings from Last 3 Encounters:  09/26/15 61.236 kg (135 lb)  09/26/15 63.594 kg (140 lb 3.2 oz)  08/06/15 67.268 kg (148 lb 4.8 oz)    Physical Exam:  Constitutional: He is oriented to person, place, and time.  HENT:  OD with decreased conjunctival irritation---no drainage Normocephalic. Atraumatic Eyes: EOM and Conj are normal.  Neck: Normal range of motion. Neck supple. No thyromegaly present.  Cardiovascular: Irregular irregular , rate controlled Respiratory: Effort normal and breath sounds normal. No respiratory distress.  GI: Soft. Bowel sounds are normal. He exhibits no distension.  Neurological: He is alert and oriented to person, place, and time. He exhibits normal muscle tone.  Follows full commands.  Good insight and awareness.  Right facial weakness. No hearing on right.  B/L UE: 4/5 proximal to distal B/L LE: 3 to 3+/5 Hip flexion, 4/5 knee extension, ankle dorsi/plantar flexion Decreased sensation to light touch distal LE below the knees bilaterally 3+ DTR RLE and RUE. Skin: Skin is warm and dry. A few small abrasions  noted Psychiatric: He has a normal mood and affect. His behavior is normal   Assessment/Plan: 1. 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 Physiatrist is providing close team supervision and 24 hour management of active medical problems listed below. Physiatrist and rehab team continue to assess barriers to discharge/monitor patient progress toward functional and medical goals.  Function:  Bathing Bathing position   Position: Shower  Bathing parts Body parts bathed by patient: Right arm, Left arm, Chest, Abdomen, Front perineal area, Buttocks, Right upper leg, Left upper leg, Right lower leg, Left lower leg Body parts bathed by helper: Back  Bathing assist Assist Level: Touching or steadying assistance(Pt > 75%)      Upper Body Dressing/Undressing Upper body dressing   What is the patient wearing?: Pull over shirt/dress     Pull over shirt/dress - Perfomed by patient: Thread/unthread right sleeve, Thread/unthread left sleeve, Put head through opening, Pull shirt over trunk          Upper body assist Assist Level: More than reasonable time   Set up : To obtain clothing/put away  Lower Body Dressing/Undressing Lower body dressing   What is the patient wearing?: Pants, Shoes, Socks     Pants- Performed by patient: Thread/unthread right pants leg, Thread/unthread left pants leg, Pull pants up/down Pants- Performed by helper: Thread/unthread left pants leg, Thread/unthread right pants leg, Pull pants up/down Non-skid slipper socks- Performed by patient: Don/doff right sock, Don/doff left sock   Socks - Performed by patient: Don/doff right sock,  Don/doff left sock   Shoes - Performed by patient: Don/doff right shoe, Don/doff left shoe            Lower body assist Assist for lower body dressing: Touching or steadying assistance (Pt > 75%) (assist  for dynamoic sitting balance)      Toileting Toileting   Toileting steps completed by patient: Adjust clothing prior to toileting, Adjust clothing after toileting, Performs perineal hygiene Toileting steps completed by helper: Performs perineal hygiene Toileting Assistive Devices: Grab bar or rail  Toileting assist Assist level: No help/no cues   Transfers Chair/bed transfer Chair/bed transfer activity did not occur: N/A Chair/bed transfer method: Ambulatory Chair/bed transfer assist level: Touching or steadying assistance (Pt > 75%)       Locomotion Ambulation     Max distance: 220 Assist level: Touching or steadying assistance (Pt > 75%)   Wheelchair Wheelchair activity did not occur: N/A Type: Manual Max wheelchair distance: 125 Assist Level: Supervision or verbal cues  Cognition Comprehension Comprehension assist level: Understands basic 90% of the time/cues < 10% of the time  Expression Expression assist level: Expresses basic 90% of the time/requires cueing < 10% of the time.  Social Interaction       Social Interaction assist level: Interacts appropriately with others with medication or extra time (anti-anxiety, antidepressant).  Problem Solving Problem solving assist level: Solves basic 90% of the time/requires cueing < 10% of the time  Memory Memory assist level: Recognizes or recalls 90% of the time/requires cueing < 10% of the time   Medical Problem List and Plan: 1. Weakness secondary to SDH after fall,was anticoagulated at time of fall  Hx right acoustic neuroma  -continue CIR therapies. Mod I goals 2. DVT Prophylaxis/Anticoagulation: SCD 3. Pain Management: Tylenol as needed 4. Mood: Ativan 0.5 mg every 6 hours as needed, Wellbutrin 100 mg twice a day, 5. Neuropsych: This patient is capable of making decisions on his own behalf. 6. Skin/Wound Care: Routine skin checks. Skin intact 7. Fluids/Electrolytes/Nutrition: encourage po. Good intake at present 8.  History of atrial fibrillation. Chronic Coumadin discontinued secondary to subdural hematoma. Continue Cardizem 240 mg daily. Cardiac rate elevated will increase toprol xl 9. Hypertension. Toprol XL25 mg daily will increase to 50mg , Norvasc 5 mg daily, may need to reduce to 2.5mg  if BP drops too low. 10. Diabetes mellitus with peripheral neuropathy. Hemoglobin A1c 5.5. Sugars under control. Pt states numbness due to neuroma but that doesn't appear accurate  12. BPH. Flomax 0.4 mg daily. Check PVRs 3   LOS (Days) 4 A FACE TO FACE EVALUATION WAS PERFORMED  KIRSTEINS,ANDREW E 09/30/2015 9:04 AM

## 2015-09-30 NOTE — Progress Notes (Signed)
Physical Therapy Session Note  Patient Details  Name: ATHARVA KOHEN MRN: [id number] Date of Birth: 11-06-1940  Today's Date: 09/30/2015 PT Individual Time: [id number] PT Individual Time Calculation (min): 45 min   Short Term Goals: Week 1:  PT Short Term Goal 1 (Week 1): Patient will ambulate 150 ft with supervision.  PT Short Term Goal 2 (Week 1): Patient will transfer bed <> wheelchair with supervision.  PT Short Term Goal 3 (Week 1): Patient will maintain standing balance during functional task x 3 min with supervision.  PT Short Term Goal 4 (Week 1): Patient will perform floor transfer with min A.   Skilled Therapeutic Interventions/Progress Updates:  Pt was seen bedside in the pm. Pt transferred supine to edge of bed with side rail and no assist. Pt transferred sit to stand with min A and verbal cues. Pt ambulated 150 feet to rehab gym with min A and verbal cues. In gym treatment focused on NMR, utilizing cone taps, criss cross cone taps and alternating cone taps, 3 sets x 10 reps each. Pt ambulated 125 feet x 2 without assistive device min A and verbal cues. Pt performed toilet transfers with min guard and grab bars. Pt ambulated about 350 feet with min A and verbal cues without assistive device. Pt transferred edge of bed to supine with side rails and no assist. Pt left sitting up in bed with call bell within reach and bed alarm on.   Therapy Documentation Precautions:  Precautions Precautions: Fall Precaution Comments: hx of R facial paralysis Restrictions Weight Bearing Restrictions: No General:   Pain: No c/o pain.   See Function Navigator for Current Functional Status.   Therapy/Group: Individual Therapy  Paxtin, Yax 09/30/2015, 3:33 PM

## 2015-10-01 ENCOUNTER — Inpatient Hospital Stay (HOSPITAL_COMMUNITY): Payer: Medicare Other | Admitting: Occupational Therapy

## 2015-10-01 ENCOUNTER — Encounter (HOSPITAL_COMMUNITY): Payer: Medicare Other

## 2015-10-01 ENCOUNTER — Inpatient Hospital Stay (HOSPITAL_COMMUNITY): Payer: Medicare Other | Admitting: Physical Therapy

## 2015-10-01 ENCOUNTER — Ambulatory Visit: Admission: RE | Admit: 2015-10-01 | Payer: Medicare Other | Source: Ambulatory Visit | Admitting: Radiation Oncology

## 2015-10-01 LAB — GLUCOSE, CAPILLARY
GLUCOSE-CAPILLARY: 105 mg/dL — AB (ref 65–99)
GLUCOSE-CAPILLARY: 106 mg/dL — AB (ref 65–99)
Glucose-Capillary: 94 mg/dL (ref 65–99)
Glucose-Capillary: 112 mg/dL — ABNORMAL HIGH (ref 65–99)

## 2015-10-01 NOTE — Progress Notes (Addendum)
Physical Therapy Session Note and Vestibular Assessment  Patient Details  Name: Tommy Weeks MRN: [id number] Date of Birth: 1941-02-28  Today's Date: 10/01/2015 PT Individual Time: 1307-1403 PT Individual Time Calculation (min): 56 min   Short Term Goals: Week 1:  PT Short Term Goal 1 (Week 1): Patient will ambulate 150 ft with supervision.  PT Short Term Goal 2 (Week 1): Patient will transfer bed <> wheelchair with supervision.  PT Short Term Goal 3 (Week 1): Patient will maintain standing balance during functional task x 3 min with supervision.  PT Short Term Goal 4 (Week 1): Patient will perform floor transfer with min A.   Pt received in bed resting; pt transferred to EOB mod I.  Pt requesting to use toilet.  Performed sit >stand from bed with min A and ambulated to toilet with min A; performed toileting tasks with min A for balance.  Performed gait to gym with min A x 100' with pt presenting with L lateral bias during gait.  Patient reporting new onset of dizziness with reoccurence of R acoustic neuroma with multiple falls at home.  Pt describes dizziness as "objects in front of me moving"; pt describing oscillopsia and reports that it occurs only when ambulating and is not present during bed mobility or when sitting.  Pt denies double vision, nausea or vomiting.    Glasses: Y for reading    H/O falls: 3 at home  Antivertiginous Medications:  None  1. Vestibular Assessment                           Gross neck ROM Pt moves head en bloc with rest of body  Eye Alignment WFL with inability to close R eye fully   Oculomotor ROM WFL  Spontaneous  Nystagmus (room light and vision occluded) None seen in room light  Gaze holding nystagmus(room light and vision occluded) None noted in room light  Smooth pursuit Mild saccadic intrusions  Saccades Extra head movements and extra eye movements noted  Vergence WFL  VOR Cancellation WFL  Pressure Tests (vision occluded) N/T  VOR slow Pt  very guarded but appears [facility]  Head Thrust Test + bilaterally  Head Shaking Test (vision occluded) N/T  Dynamic Visual Acuity         Impaired; 3 line difference  Rt. Hallpike Dix N/T  Lt. Hallpike Dix N/T  Rt. Roll Test N/T  Lt. Roll Test  N/T  MSQ N/T  Cover-Cross Cover (if indicated) N/T  Head-Neck Differentiation Test (if indicated) N/T   2. Assessment of Gait and Balance(static and dynamic): mCTSIB: Condition 1: 30 seconds, Condition 2: unable, Condition 3: 30 seconds, Condition 4: unable indicating pt heavily reliant on visual input for balance and postural control; pt reports neuropathy and decreased sensation in bilateral feet affecting proprioceptive input.    3. 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    4. Recommendations for Treatment: Pt educated on and initiated Adaptation exercises of x 1 viewing with target 46ft away with pt in unsupported sitting with goal to perform vertical and horizontal head movements x 30 seconds each.  Pt experienced significant retinal slip with horizontal and vertical head movements and required manual facilitation of head pitches and rotation to avoid en bloc movement of head/shoulders/trunk.  When pt able to perform in sitting x 1 minute, progress to performing in standing x 30 seconds.  Recommending use of substitution exercises (spotting-eye/head movements between two targets) and incorporation of adaptation and substitution exercises into functional activities.  Pt to continue postural control and balance training on a variety of surfaces (stable and compliant) and in lower light conditions with adaptation and substitution exercises.          Therapy Documentation Precautions:  Precautions Precautions: Fall Precaution Comments: hx of R facial paralysis Restrictions Weight Bearing Restrictions: No Pain: Pain Assessment Pain Assessment:  No/denies pain  See Function Navigator for Current Functional Status.   Therapy/Group: Individual Therapy  Raylene Everts [facility] 10/01/2015, 8:59 PM

## 2015-10-01 NOTE — Progress Notes (Signed)
Occupational Therapy Session Note  Patient Details  Name: Tommy Weeks MRN: [id number] Date of Birth: October 29, 1940  Today's Date: 10/01/2015 OT Individual Time: 0830-1000 OT Individual Time Calculation (min): 90 min    Short Term Goals: Week 1:  OT Short Term Goal 1 (Week 1): Pt will transfer into shower stall with min A  OT Short Term Goal 2 (Week 1): Pt will self correct LOB with min A 50% OT Short Term Goal 3 (Week 1): Pt will perform 3 grooming tasks in standing with supervision.  Skilled Therapeutic Interventions/Progress Updates:    Pt seen for skilled OT this session to facilitate static and dynamic standing balance, postural control, and anticipatory safety with mobility in the room.  Pt received in bed and ready to start ADL routine.  He gathered his clothing by walking around his room with touching A and verbal cues. Cues needed for reaching into drawers safely using slightly bent knees with one hand on dresser for support as pt was leaning back through his hips with knees hyperextended. Pt also was trying to carry his clothing and took a step with r foot over bedtable leg and had LOB to L with mod A to recover. Reviewed safety to check environment, only do one thing at a time, etc.  Pt then showered with close S, donned clothing and completed grooming at the sink.  Pt then became fatigued and asked for a 4 min rest break while he layed down. Pt ambulated with min A to gym.  From mat table, pt worked on 4 sets of: sit>< stand with hands on thighs with min A to guide wt shift, static standing holds, standing with reaching arms overhead with min A as pt tends to lean forward through toes, standing wt shifts from toes to heels, wt shifts laterally.  After each set, pt needed a rest break.  Pt then ambulated back to room, but after 50 ft had LOB to R with mod A to recover and then continued to have more difficulty the last 50 feet needing mod A support to continue walking.  Pt slumped down on  his bed falling to his L side toward end of bed with mod-max A to prevent him from falling. Pt stated that he felt that his knees were just giving out. Vitals checked and all WNL. Pt stated he was fine now that he was resting.  Pt adjusted in bed with bed alarm set and all needs met.  Therapy Documentation Precautions:  Precautions Precautions: Fall Precaution Comments: hx of R facial paralysis Restrictions Weight Bearing Restrictions: No    Vital Signs: Therapy Vitals Temp: 98 F (36.7 C) Temp Source: Oral Pulse Rate: (!) 109 (just after walking over 100 feet) Resp: 18 BP: 129/81 mmHg Patient Position (if appropriate): Lying Oxygen Therapy SpO2: 95 % O2 Device: Not Delivered Pain: Pain Assessment Pain Assessment: No/denies pain ADL:  See Function Navigator for Current Functional Status.   Therapy/Group: Individual Therapy  [name] 10/01/2015, 10:35 AM

## 2015-10-01 NOTE — Progress Notes (Signed)
Physical Therapy Session Note  Patient Details  Name: Tommy Weeks MRN: [id number] Date of Birth: 28-Mar-1941  Today's Date: 10/01/2015 PT Individual Time: 6486-1612 PT Individual Time Calculation (min): 54 min   Short Term Goals: Week 1:  PT Short Term Goal 1 (Week 1): Patient will ambulate 150 ft with supervision.  PT Short Term Goal 2 (Week 1): Patient will transfer bed <> wheelchair with supervision.  PT Short Term Goal 3 (Week 1): Patient will maintain standing balance during functional task x 3 min with supervision.  PT Short Term Goal 4 (Week 1): Patient will perform floor transfer with min A.   Skilled Therapeutic Interventions/Progress Updates:    Pt received sitting EOB, resting, and agreeable to therapy.  Pt reporting significant fatigue in BLEs.  Stand/pivot from EOB>w/c with min assist for balance during pivot.  Long distance w/c propulsion >500' for UE strengthening and overall endurance.  PT instructed patient in BUE therex 2x10 reps with 4# weight bicep curls, pronated bicep curls, chest presses, and shoulder presses.  Pt requesting to toilet, so returned to room in w/c total assist for urgency and pt amb w/c<>bathroom with min assist for balance.  Pt returned to gym and PT instructed patient in spelling holiday words with 3# weight ball: christmas, noel, carol, tree, and ornament, with rest breaks in between each word.  Pt performed chair push ups 2x10 with focus on eccentric control.  Pt propelled w/c back to room at end of session and transferred w/c>bed with stand pivot and steady assist.  Pt positioned self in bed independently and left supine with call bell in reach and needs met.   Therapy Documentation Precautions:  Precautions Precautions: Fall Precaution Comments: hx of R facial paralysis Restrictions Weight Bearing Restrictions: No Pain: Pain Assessment Pain Assessment: No/denies pain   See Function Navigator for Current Functional  Status.   Therapy/Group: Individual Therapy  Earnest Conroy Penven-Crew 10/01/2015, 11:34 AM

## 2015-10-01 NOTE — Progress Notes (Signed)
[facility] MEDICINE & REHABILITATION     PROGRESS NOTE    Subjective/Complaints: Pt alert. Sitting at EOB. No new complaints except bored this morning!  ROS: Pt denies fever, rash/itching, headache, blurred or double vision, nausea, vomiting, abdominal pain, diarrhea, chest pain, shortness of breath, palpitations, dysuria, dizziness, neck or back pain, bleeding, anxiety, or depression  Objective: Vital Signs: Blood pressure 133/80, pulse 88, temperature 98 F (36.7 C), temperature source Oral, resp. rate 18, height 6' (1.829 m), weight 61.236 kg (135 lb), SpO2 97 %. No results found. No results for input(s): WBC, HGB, HCT, PLT in the last 72 hours. No results for input(s): NA, K, CL, GLUCOSE, BUN, CREATININE, CALCIUM in the last 72 hours.  Invalid input(s): CO CBG (last 3)   Recent Labs  09/30/15 1638 09/30/15 2129 10/01/15 0712  GLUCAP 112* 108* 94    Wt Readings from Last 3 Encounters:  09/26/15 61.236 kg (135 lb)  09/26/15 63.594 kg (140 lb 3.2 oz)  08/06/15 67.268 kg (148 lb 4.8 oz)    Physical Exam:  Constitutional: He is oriented to person, place, and time.  HENT:  OD with decreased conjunctival irritation---no drainage Normocephalic. Atraumatic Eyes: EOM and Conj are normal.  redness at outer edge OD Neck: Normal range of motion. Neck supple. No thyromegaly present.  Cardiovascular: Irregular irregular , rate controlled Respiratory: Effort normal and breath sounds normal. No respiratory distress.  GI: Soft. Bowel sounds are normal. He exhibits no distension.  Neurological: He is alert and oriented to person, place, and time. He exhibits normal muscle tone.  Follows full commands.  Good insight and awareness.  Right facial weakness. No hearing on right.  B/L UE: 4/5 proximal to distal B/L LE: 3 to 3+/5 Hip flexion, 4/5 knee extension, ankle dorsi/plantar flexion Decreased sensation to light touch distal LE below the knees bilaterally 3+ DTR  RLE and RUE. Skin: Skin is warm and dry. A few small abrasions noted Psychiatric: He has a normal mood and affect. His behavior is normal   Assessment/Plan: 1. 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 Physiatrist is providing close team supervision and 24 hour management of active medical problems listed below. Physiatrist and rehab team continue to assess barriers to discharge/monitor patient progress toward functional and medical goals.  Function:  Bathing Bathing position   Position: Shower  Bathing parts Body parts bathed by patient: Right arm, Left arm, Chest, Abdomen, Front perineal area, Buttocks, Right upper leg, Left upper leg, Right lower leg, Left lower leg Body parts bathed by helper: Back  Bathing assist Assist Level: Touching or steadying assistance(Pt > 75%)      Upper Body Dressing/Undressing Upper body dressing   What is the patient wearing?: Pull over shirt/dress     Pull over shirt/dress - Perfomed by patient: Thread/unthread right sleeve, Thread/unthread left sleeve, Put head through opening, Pull shirt over trunk          Upper body assist Assist Level: More than reasonable time   Set up : To obtain clothing/put away  Lower Body Dressing/Undressing Lower body dressing   What is the patient wearing?: Pants, Shoes, Socks     Pants- Performed by patient: Thread/unthread right pants leg, Thread/unthread left pants leg, Pull pants up/down Pants- Performed by helper: Thread/unthread left pants leg, Thread/unthread right pants leg, Pull pants up/down Non-skid slipper socks- Performed by patient: Don/doff right sock, Don/doff  left sock   Socks - Performed by patient: Don/doff right sock, Don/doff left sock   Shoes - Performed by patient: Don/doff right shoe, Don/doff left shoe            Lower body assist Assist for lower body  dressing: Touching or steadying assistance (Pt > 75%) (assist for dynamoic sitting balance)      Toileting Toileting   Toileting steps completed by patient: Adjust clothing prior to toileting, Adjust clothing after toileting, Performs perineal hygiene Toileting steps completed by helper: Performs perineal hygiene Toileting Assistive Devices: Grab bar or rail  Toileting assist Assist level: No help/no cues   Transfers Chair/bed transfer Chair/bed transfer activity did not occur: N/A Chair/bed transfer method: Ambulatory Chair/bed transfer assist level: Touching or steadying assistance (Pt > 75%)       Locomotion Ambulation     Max distance: 350 Assist level: Touching or steadying assistance (Pt > 75%)   Wheelchair Wheelchair activity did not occur: N/A Type: Manual Max wheelchair distance: 125 Assist Level: Supervision or verbal cues  Cognition Comprehension Comprehension assist level: Understands basic 90% of the time/cues < 10% of the time  Expression Expression assist level: Expresses basic 90% of the time/requires cueing < 10% of the time.  Social Interaction       Social Interaction assist level: Interacts appropriately with others with medication or extra time (anti-anxiety, antidepressant).  Problem Solving Problem solving assist level: Solves basic 90% of the time/requires cueing < 10% of the time  Memory Memory assist level: Recognizes or recalls 90% of the time/requires cueing < 10% of the time   Medical Problem List and Plan: 1. Weakness secondary to SDH after fall,was anticoagulated at time of fall  Hx right acoustic neuroma  -continue CIR therapies. Mod I goals 2. DVT Prophylaxis/Anticoagulation: SCD, ambulation 3. Pain Management: Tylenol as needed 4. Mood: Ativan 0.5 mg every 6 hours as needed, Wellbutrin 100 mg twice a day, 5. Neuropsych: This patient is capable of making decisions on his own behalf. 6. Skin/Wound Care: Routine skin checks. Skin intact 7.  Fluids/Electrolytes/Nutrition: encourage po. Good intake at present 8. History of atrial fibrillation. Chronic Coumadin discontinued secondary to subdural hematoma. Continue Cardizem 240 mg daily. Cardiac rate elevated will increase toprol xl 9. Hypertension. Toprol XL25 mg daily will increase to 50mg , Norvasc 5 mg daily, may need to reduce to 2.5mg  if BP drops too low. 10. Diabetes mellitus with peripheral neuropathy. Hemoglobin A1c 5.5. Sugars under control at present 12. BPH. Flomax 0.4 mg daily. Check PVRs 3   LOS (Days) 5 A FACE TO FACE EVALUATION WAS PERFORMED  Yarnell Kozloski T 10/01/2015 9:03 AM

## 2015-10-02 ENCOUNTER — Inpatient Hospital Stay (HOSPITAL_COMMUNITY): Payer: Medicare Other | Admitting: Occupational Therapy

## 2015-10-02 ENCOUNTER — Inpatient Hospital Stay (HOSPITAL_COMMUNITY): Payer: Medicare Other | Admitting: Physical Therapy

## 2015-10-02 DIAGNOSIS — S065X0A Traumatic subdural hemorrhage without loss of consciousness, initial encounter: Secondary | ICD-10-CM | POA: Insufficient documentation

## 2015-10-02 LAB — GLUCOSE, CAPILLARY
GLUCOSE-CAPILLARY: 120 mg/dL — AB (ref 65–99)
Glucose-Capillary: 86 mg/dL (ref 65–99)
Glucose-Capillary: 120 mg/dL — ABNORMAL HIGH (ref 65–99)
Glucose-Capillary: 121 mg/dL — ABNORMAL HIGH (ref 65–99)

## 2015-10-02 NOTE — Progress Notes (Signed)
[facility] PHYSICAL MEDICINE & REHABILITATION     PROGRESS NOTE    Subjective/Complaints: Had an excellent night sleep. Denies pain. Still with balance/vestibular issues  ROS: Pt denies fever, rash/itching, headache, blurred or double vision, nausea, vomiting, abdominal pain, diarrhea, chest pain, shortness of breath, palpitations, dysuria, dizziness, neck or back pain, bleeding, anxiety, or depression  Objective: Vital Signs: Blood pressure 111/59, pulse 99, temperature 98.4 F (36.9 C), temperature source Oral, resp. rate 18, height 6' (1.829 m), weight 61.236 kg (135 lb), SpO2 97 %. No results found. No results for input(s): WBC, HGB, HCT, PLT in the last 72 hours. No results for input(s): NA, K, CL, GLUCOSE, BUN, CREATININE, CALCIUM in the last 72 hours.  Invalid input(s): CO CBG (last 3)   Recent Labs  10/01/15 1736 10/01/15 2135 10/02/15 0639  GLUCAP 112* 106* 86    Wt Readings from Last 3 Encounters:  09/26/15 61.236 kg (135 lb)  09/26/15 63.594 kg (140 lb 3.2 oz)  08/06/15 67.268 kg (148 lb 4.8 oz)    Physical Exam:  Constitutional: He is oriented to person, place, and time.  HENT:  OD with decreased conjunctival irritation---no drainage Normocephalic. Atraumatic Eyes: EOM and Conj are normal.  eye clear Neck: Normal range of motion. Neck supple. No thyromegaly present.  Cardiovascular: Irregular irregular , rate controlled Respiratory: Effort normal and breath sounds normal. No respiratory distress.  GI: Soft. Bowel sounds are normal. He exhibits no distension.  Neurological: He is alert and oriented to person, place, and time. He exhibits normal muscle tone.  Follows full commands.  Good insight and awareness.  Right facial weakness. No hearing on right.  B/L UE: 4/5 proximal to distal B/L LE: 3 to 3+/5 Hip flexion, 4/5 knee extension, ankle dorsi/plantar flexion Decreased sensation to light touch distal LE below the knees bilaterally 3+ DTR RLE  and RUE. Skin: Skin is warm and dry. A few small abrasions noted Psychiatric: He has a normal mood and affect. His behavior is normal   Assessment/Plan: 1. 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 Physiatrist is providing close team supervision and 24 hour management of active medical problems listed below. Physiatrist and rehab team continue to assess barriers to discharge/monitor patient progress toward functional and medical goals.  Function:  Bathing Bathing position   Position: Shower  Bathing parts Body parts bathed by patient: Right arm, Left arm, Chest, Abdomen, Front perineal area, Buttocks, Right upper leg, Left upper leg, Right lower leg, Left lower leg Body parts bathed by helper: Back  Bathing assist Assist Level: Supervision or verbal cues (used grab bars for support in standing)      Upper Body Dressing/Undressing Upper body dressing   What is the patient wearing?: Pull over shirt/dress, Button up shirt     Pull over shirt/dress - Perfomed by patient: Thread/unthread right sleeve, Thread/unthread left sleeve, Put head through opening, Pull shirt over trunk   Button up shirt - Perfomed by patient: Thread/unthread right sleeve, Thread/unthread left sleeve, Pull shirt around back, Button/unbutton shirt      Upper body assist Assist Level: Supervision or verbal cues (S to gather clothing himself)   Set up : To obtain clothing/put away  Lower Body Dressing/Undressing Lower body dressing   What is the patient wearing?: Pants, Shoes, Socks     Pants- Performed by patient: Thread/unthread right pants leg, Thread/unthread left pants leg, Pull pants  up/down Pants- Performed by helper: Thread/unthread left pants leg, Thread/unthread right pants leg, Pull pants up/down Non-skid slipper socks- Performed by patient: Don/doff right sock, Don/doff  left sock   Socks - Performed by patient: Don/doff right sock, Don/doff left sock   Shoes - Performed by patient: Don/doff right shoe, Don/doff left shoe, Fasten right, Fasten left            Lower body assist Assist for lower body dressing: Touching or steadying assistance (Pt > 75%)      Toileting Toileting   Toileting steps completed by patient: Adjust clothing prior to toileting, Performs perineal hygiene, Adjust clothing after toileting Toileting steps completed by helper: Performs perineal hygiene Toileting Assistive Devices: Grab bar or rail  Toileting assist Assist level: Touching or steadying assistance (Pt.75%)   Transfers Chair/bed transfer Chair/bed transfer activity did not occur: N/A Chair/bed transfer method: Stand pivot Chair/bed transfer assist level: Touching or steadying assistance (Pt > 75%) Chair/bed transfer assistive device: Armrests     Locomotion Ambulation     Max distance: 100 Assist level: Touching or steadying assistance (Pt > 75%)   Wheelchair Wheelchair activity did not occur: N/A Type: Manual Max wheelchair distance: >500 Assist Level: Supervision or verbal cues  Cognition Comprehension Comprehension assist level: Follows complex conversation/direction with extra time/assistive device  Expression Expression assist level: Expresses complex ideas: With extra time/assistive device  Social Interaction       Social Interaction assist level: Interacts appropriately with others with medication or extra time (anti-anxiety, antidepressant).  Problem Solving Problem solving assist level: Solves basic 90% of the time/requires cueing < 10% of the time  Memory Memory assist level: Recognizes or recalls 90% of the time/requires cueing < 10% of the time   Medical Problem List and Plan: 1. Weakness secondary to SDH after fall,was anticoagulated at time of fall  Hx right acoustic neuroma  -team conference today 2. DVT Prophylaxis/Anticoagulation: SCD,  ambulation 3. Pain Management: Tylenol as needed 4. Mood: Ativan 0.5 mg every 6 hours as needed, Wellbutrin 100 mg twice a day, 5. Neuropsych: This patient is capable of making decisions on his own behalf. 6. Skin/Wound Care: Routine skin checks. Skin intact 7. Fluids/Electrolytes/Nutrition: encourage po. Good intake at present 8. History of atrial fibrillation. Chronic Coumadin discontinued secondary to subdural hematoma. Continue Cardizem 240 mg daily. Cardiac rate elevated will increase toprol xl 9. Hypertension. Toprol XL 50mg , Norvasc 5 mg daily, may need to reduce to 2.5mg  if BP drops too low. 10. Diabetes mellitus with peripheral neuropathy. Hemoglobin A1c 5.5. Sugars under control at present 12. BPH. Flomax 0.4 mg daily. Check PVRs 3   LOS (Days) 6 A FACE TO FACE EVALUATION WAS PERFORMED  Novalee Horsfall T 10/02/2015 9:08 AM

## 2015-10-02 NOTE — Progress Notes (Signed)
Physical Therapy Session Note  Patient Details  Name: Tommy Weeks MRN: [id number] Date of Birth: [birth year]  Today's Date: 10/02/2015 PT Individual Time: [id number] PT Individual Time Calculation (min): 60 min   Short Term Goals: Week 1:  PT Short Term Goal 1 (Week 1): Patient will ambulate 150 ft with supervision.  PT Short Term Goal 2 (Week 1): Patient will transfer bed <> wheelchair with supervision.  PT Short Term Goal 3 (Week 1): Patient will maintain standing balance during functional task x 3 min with supervision.  PT Short Term Goal 4 (Week 1): Patient will perform floor transfer with min A.   Skilled Therapeutic Interventions/Progress Updates:   Patient sitting edge of bed, reporting fatigue. Discussed recommendation for 24/7 supervision upon discharge due to continued balance impairments and history of falls PTA, patient verbalized understanding. Patient educated on reason for not using RW up to this point to challenge balance and discussed need to reintroduce AD to improve safety and decrease risk of falls for discharge home. Patient ambulated using RW throughout rehab unit, on/off elevators, over thresholds and throughout gift shop with supervision-min A and one seated rest break. Patient required cues to turn slowly and not pick RW up from ground for safety. Throughout session patient performed sit <> stand using RW with supervision and max cues for safe hand placement. Stair training up/down 4 (6") stairs using 2 rails with supervision ascending and min A with episode of "weakness" when descending, anticipate due to fatigue. After seated rest, patient negotiated up/down 4 (6") stairs laterally with BUE support on R rail ascending to simulate home environment with supervision and cues for sequencing. Standing > seated x1 viewing exercise with horizontal head turns for gaze stabilization with emphasis on keeping eyes on target and therapist holding shoulders to prevent moving head and  neck in unison and promote cervical rotation to R and L. Patient with no c/o dizziness but with significant difficulty performing exercises as instructed. NuStep at level 4>2 using BLE only x 5 min for strengthening and activity tolerance. After seated rest, patient ambulated back to room using RW with supervision and left sitting edge of bed with all needs within reach.   Therapy Documentation Precautions:  Precautions Precautions: Fall Precaution Comments: hx of R facial paralysis Restrictions Weight Bearing Restrictions: No Pain: Pain Assessment Pain Assessment: No/denies pain   See Function Navigator for Current Functional Status.   Therapy/Group: Individual Therapy  [name] 10/02/2015, 4:07 PM

## 2015-10-02 NOTE — Consult Note (Signed)
  NEUROBEHAVIORAL STATUS EXAM - [facility]   MEDICAL NECESSITY:  Tommy Weeks was seen on the [facility] for a neurobehavioral status exam owing to the patient's diagnosis of traumatic SDH, and to assist in treatment planning during admission.   Records indicate that Tommy Weeks is a "74 y.o. 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 Patient lives with spouse who has reported dementia require 24-hour care. He has a personal care attendant to help his wife. Patient uses a single-point cane prior to admission.Presented 09/23/2015 with weakness lower extremities and to a lesser extent upper extremities. He does note 3 separate falls over the past 2 weeks hitting his head against a door jam in the bathroom. INR on admission of 2.97. CT of the brain showed a right frontal isodense subacute subdural hematoma without significant mass effect. Patient's Coumadin was reversed. Follow-up neurosurgery Dr. Sherwood Gambler advise conservative care. Latest follow-up cranial CT scan 09/25/2015 with overall appearance smaller left-sided subdural hematoma without midline shift."   During today's visit, Tommy Weeks reported that he fell and hit his head on a door jam. Approximately 3 days later he started suffering from unsteadiness and poor balance. He was later found to have suffered a subdural hematoma. 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   Tommy Weeks denied experiencing any cognitive or emotional difficulties for any reason. He is the primary  caregiver for his wife who has been diagnosed with dementia. He did not express too much worry about this situation as he has ample social support from his family as well as a personal aide that assists with her instrumental daily needs.   Tommy Weeks described his current mood as "pretty good." He is adjusting well to this hospitalization. He has no history of depression or anxiety. Suicidal/homicidal ideation, plan or intent was denied. No manic or hypomanic episodes were reported. The patient denied ever experiencing any auditory/visual hallucinations. No major behavioral or personality changes were endorsed.   Patient feels that progress is being made in therapy. No barriers to therapy identified. His main goal is to ameliorate unsteadiness and poor balance. He described the rehab staff as "fantastic." He is discharging soon without too much trepidation.   PROCEDURES: [2 units 96116] Diagnostic clinical interview  Review of available records Mental Status Exam-2 (brief version)  MENTAL STATUS: Tommy Weeks mental status exam score of 15/16 was above the cutoff used to indicate cognitive impairment or dementia. He was fully oriented and able to immediately register 3 words. He recalled 2 of 3 of them after a delay.    IMPRESSION: Tommy Weeks denied experiencing any cognitive or emotional difficulties post-SDH. No overt symptoms were observed and very brief mental status exam was intact. He is adjusting well to this admission and he appears to have ample support via his family. He looks to be discharging at the end of the week with no major concerns. As such, neuropsychology does not need to follow-up with the patient unless requested by the patient or rehab staff.     Rutha Bouchard, Psy.D.  Clinical Neuropsychologist

## 2015-10-02 NOTE — Progress Notes (Signed)
Occupational Therapy Session Note  Patient Details  Name: Tommy Weeks MRN: [id number] Date of Birth: [birth year]  Today's Date: 10/02/2015 OT Individual Time: 0830-0900 and 1102-1200 OT Individual Time Calculation (min): 30 min and 58 min   Short Term Goals: Week 1:  OT Short Term Goal 1 (Week 1): Pt will transfer into shower stall with min A  OT Short Term Goal 2 (Week 1): Pt will self correct LOB with min A 50% OT Short Term Goal 3 (Week 1): Pt will perform 3 grooming tasks in standing with supervision.  Skilled Therapeutic Interventions/Progress Updates:    Visit 1: No c/o pain.   Pt seen this am for skilled OT to facilitate functional mobility with ADL skills. Pt opted to skip a shower today to allow for more time to shave. Advised pt to sit while shaving today to allow him to fully concentrate on shaving versus focusing on standing balance at the same time. Completed grooming seated. Pt demonstrated improvement with sit><stand with only slight guiding A and increased standing postural control when donning button up shirt as he had to reach around. Cues needed for controlled eccentric sit.  Pt's PT arrived at end of session.   Visit 2: no c/o pain. Pt seen for skilled OT to facilitate activity tolerance and postural control in standing.  Pt ambulated to gym with mod A as he had several LOB to both L and R and needed cues to step with feet wider and slower steps.  In gym pt worked on activity tolerance with seated UE AROM exercises focusing on postural extension with pelvic tilt forward as pt tends to sit with posterior pelvic tilt and curved lower spine. He completed 1-2 min of exercise followed by 1-2 min of rest for numerous sets.  Pt worked on standing balance exercises with lateral wt shifts, forward/back wt shifts with BUE on grocery cart handle, lateral stepping out and standing with overhead arm reaches. During middle of session pt needed to toilet, returned to room briefly and  then came back to gym. At end of session, pt completed squat pivot with S to w/c and then used BLE to propel back to his room.  Pt in room with all needs met.   Therapy Documentation Precautions:  Precautions Precautions: Fall Precaution Comments: hx of R facial paralysis Restrictions Weight Bearing Restrictions: No    Vital Signs: Therapy Vitals BP: 120/61 mmHg    ADL:  See Function Navigator for Current Functional Status.   Therapy/Group: Individual Therapy  SAGUIER,JULIA 10/02/2015, 11:49 AM

## 2015-10-02 NOTE — Progress Notes (Signed)
Physical Therapy Session Note  Patient Details  Name: Tommy Weeks MRN: [id number] Date of Birth: [birth year]  Today's Date: 10/02/2015 PT Individual Time: 0900-1000 PT Individual Time Calculation (min): 60 min   Short Term Goals: Week 1:  PT Short Term Goal 1 (Week 1): Patient will ambulate 150 ft with supervision.  PT Short Term Goal 2 (Week 1): Patient will transfer bed <> wheelchair with supervision.  PT Short Term Goal 3 (Week 1): Patient will maintain standing balance during functional task x 3 min with supervision.  PT Short Term Goal 4 (Week 1): Patient will perform floor transfer with min A.   Skilled Therapeutic Interventions/Progress Updates:   Pt received after OT session.  Pt requesting to use toilet to have BM.  Pt ambulated to/from toilet with min A for balance and required min A for sit <> stand and standing balance during toileting tasks and hand hygiene due to lateral LOB.  Pt agreeable to continuing vestibular training in gym.  Performed ambulation to gym 100' with min A.  In gym pt re-educated on adaptation exercise of x1 viewing in sitting.   Performed 2 sets x 30 seconds each vertical and horizontal head movement with target 69ft away on blank background with therapist providing assistance at shoulders to prevent en bloc movement.  Continued education on use of substitution exercises: educated on use of eye-head movements to train saccades for gaze stabilization looking between two targets side <> side and high <> low; pt required max verbal cues for sequencing.  Performed training of eye-head movements when looking high <> low during sit <> stand with target 78ft away and then 110ft away with pt progressing from requiring min A to stabilize during standing to supervision.  Transitioned to sitting on therapy ball and performing bouncing on ball to activate otolithic organs for gaze stabilization while reading eye chart to 20/100; performed with reading task in midline, high, to R  and to L.  Pt tolerated all exercises well but required frequent rest breaks due to fatigue.  Pt requesting to return to room to use toilet again.  Ambulated with use of eye-head movements for gaze stability x100' with min A and performed all toileting tasks again with min A.  Pt left in w/c with all items within reach.    Therapy Documentation Precautions:  Precautions Precautions: Fall Precaution Comments: hx of R facial paralysis Restrictions Weight Bearing Restrictions: No Vital Signs: Therapy Vitals BP: 120/61 mmHg Pain: Pain Assessment Pain Assessment: No/denies pain   See Function Navigator for Current Functional Status.   Therapy/Group: Individual Therapy  Raylene Everts Faucette 10/02/2015, 1:39 PM

## 2015-10-03 ENCOUNTER — Inpatient Hospital Stay (HOSPITAL_COMMUNITY): Payer: Medicare Other | Admitting: Physical Therapy

## 2015-10-03 ENCOUNTER — Inpatient Hospital Stay (HOSPITAL_COMMUNITY): Payer: Medicare Other | Admitting: Occupational Therapy

## 2015-10-03 LAB — GLUCOSE, CAPILLARY
GLUCOSE-CAPILLARY: 72 mg/dL (ref 65–99)
GLUCOSE-CAPILLARY: 96 mg/dL (ref 65–99)
GLUCOSE-CAPILLARY: 86 mg/dL (ref 65–99)

## 2015-10-03 MED ORDER — DILTIAZEM HCL ER COATED BEADS 240 MG PO CP24
240.0000 mg | ORAL_CAPSULE | Freq: Every morning | ORAL | Status: DC
  Administered 2015-10-05: 240 mg via ORAL
  Filled 2015-10-03 (×2): qty 1

## 2015-10-03 NOTE — Progress Notes (Signed)
Nutrition Follow-up  DOCUMENTATION CODES:   Severe malnutrition in context of chronic illness, Underweight  INTERVENTION:  Continue Boost Breeze po once daily, each supplement provides 250 kcal and 9 grams of protein.  Continue Glucerna Shake po BID, each supplement provides 220 kcal and 10 grams of protein.  Encourage adequate PO intake.   NUTRITION DIAGNOSIS:   Malnutrition related to chronic illness as evidenced by percent weight loss, severe depletion of body fat, severe depletion of muscle mass; ongoing  GOAL:   Patient will meet greater than or equal to 90% of their needs; met  MONITOR:   PO intake, Supplement acceptance, Skin, Weight trends, Labs, I & O's  REASON FOR ASSESSMENT:   Malnutrition Screening Tool    ASSESSMENT:   74 y.o. 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 Presented 09/23/2015 with weakness lower extremities and to a lesser extent upper extremities. He does note 3 separate falls over the past 2 weeks hitting his head against a door jam in the bathroom.  CT of the brain showed a right frontal isodense subacute subdural hematoma without significant mass effect  Meal completion has been 40-100%, with most intake at 95-100%. Intake is adequate. Pt currently has Glucerna and Boost Breeze ordered with varied consumption. RD to continue with current orders to aid in wound healing. Continue to encourage adequate intake.  Diet Order:  Diet Carb Modified Fluid consistency:: Thin; Room service appropriate?: Yes  Skin:  Wound (see comment) (Stage I on L heel, unstg ulcer on coccyx)  Last BM:  12/13  Height:   Ht Readings from Last 1 Encounters:  09/26/15 6' (1.829 m)    Weight:   Wt Readings from Last 1 Encounters:  09/26/15 135 lb (61.236 kg)    Ideal Body Weight:   80.9 kg  BMI:  Body mass index is 18.31 kg/(m^2).  Estimated Nutritional Needs:   Kcal:  7096-2836  Protein:  90-100 grams  Fluid:  1.9 - 2 L/day  EDUCATION NEEDS:   No education needs identified at this time  Corrin Parker, MS, RD, LDN Pager # [phone] After hours/ weekend pager # [phone]

## 2015-10-03 NOTE — Progress Notes (Signed)
Occupational Therapy Session Note  Patient Details  Name: Tommy Weeks MRN: [id number] Date of Birth: Jul 31, 1941  Today's Date: 10/03/2015 OT Individual Time: [id number] OT Individual Time Calculation (min): 43 min    Short Term Goals: Week 1:  OT Short Term Goal 1 (Week 1): Pt will transfer into shower stall with min A  OT Short Term Goal 2 (Week 1): Pt will self correct LOB with min A 50% OT Short Term Goal 3 (Week 1): Pt will perform 3 grooming tasks in standing with supervision.  Skilled Therapeutic Interventions/Progress Updates:    Pt seen for skilled OT to facilitate functional mobility, dynamic balance and activity tolerance with use of RW and ADLs.  Pt used RW to ambulate around his room with close S and verbal cues to manage walker safely with retrieving items.  It is too difficult at this time for pt to pick up items off the floor from a standing position, so he will use a reacher. Pt states he has several reachers at home.  Pt ambulated into BR and then was able to complete ADL transfers with close S and bathed with S in shower. Advised pt to only shower at home when the paid caregiver is present in case he needs additional assist.  Pt demonstrated improved standing balance without UE support as he donned his button up shirt.  Pt stood at sink to complete grooming. After short rest, pt ambulated with RW in hallway for over 300 ft with one short break with no LOB and close S.  Pt returned to room and rested in bed.  Therapy Documentation Precautions:  Precautions Precautions: Fall Precaution Comments: hx of R facial paralysis Restrictions Weight Bearing Restrictions: No Vital Signs: Therapy Vitals Pulse Rate: 92 BP: 108/66 mmHg Pain: Pain Assessment Pain Assessment: No/denies pain ADL:  See Function Navigator for Current Functional Status.   Therapy/Group: Individual Therapy  SAGUIER,JULIA 10/03/2015, 9:46 AM

## 2015-10-03 NOTE — Progress Notes (Signed)
Social Work Patient ID: Tommy Weeks, male   DOB: [birth year], 74 y.o.   MRN: [id number]   Have reviewed team conference with pt and son, Randall Hiss.  Both aware and agreeable with targeted d/c date of 12/17 and supervision goals.  Family already has private duty care in the home for pt and wife.  Son reports that they will be moving pt and his wife to [place] by mid January to be closer to him and they are building accessible home.  Will refer for [facility] with agency that covers both this area and that area.  Continue to follow.  Nahshon Reich, LCSW

## 2015-10-03 NOTE — Progress Notes (Signed)
[place] [facility]IC[facility]ABILITATION     PROGRESS NOTE    Subjective/Complaints: Slept well again. No new complaints.   ROS: Pt denies fever, rash/itching, headache, blurred or double vision, nausea, vomiting, abdominal pain, diarrhea, chest pain, shortness of breath,   dysuria, dizziness, neck or back pain, bleeding, anxiety, or depression  Objective: Vital Signs: Blood pressure 106/64, pulse 93, temperature 99.3 F (37.4 C), temperature source Oral, resp. rate 18, height 6' (1.829 m), weight 61.236 kg (135 lb), SpO2 97 %. No results found. No results for input(s): WBC, HGB, HCT, PLT in the last 72 hours. No results for input(s): NA, K, CL, GLUCOSE, BUN, CREATININE, CALCIUM in the last 72 hours.  Invalid input(s): CO CBG (last 3)   Recent Labs  10/02/15 1202 10/02/15 1649 10/02/15 2100  GLUCAP 120* 120* 121*    Wt Readings from Last 3 Encounters:  09/26/15 61.236 kg (135 lb)  09/26/15 63.594 kg (140 lb 3.2 oz)  08/06/15 67.268 kg (148 lb 4.8 oz)    Physical Exam:  Constitutional: He is oriented to person, place, and time.  HENT:  OD with decreased conjunctival irritation---no drainage Normocephalic. Atraumatic Eyes: EOM and Conj are normal.  eye clear Neck: Normal range of motion. Neck supple. No thyromegaly present.  Cardiovascular: Irregular irregular , rate controlled in 70's to 80's Respiratory: Effort normal and breath sounds normal. No respiratory distress.  GI: Soft. Bowel sounds are normal. He exhibits no distension.  Neurological: He is alert and oriented to person, place, and time. He exhibits normal muscle tone.  Follows full commands.  Good insight and awareness.  Right facial weakness. No hearing on right.  B/L UE: 4/5 proximal to distal B/L LE: 3 to 3+/5 Hip flexion, 4/5 knee extension, ankle dorsi/plantar flexion Decreased sensation to light touch distal LE below the knees bilaterally is persistent 3+ DTR RLE and RUE. Skin: Skin  is warm and dry. A few small abrasions noted Psychiatric: He has a normal mood and affect. His behavior is normal   Assessment/Plan: 1. 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 Physiatrist is providing close team supervision and 24 hour management of active medical problems listed below. Physiatrist and rehab team continue to assess barriers to discharge/monitor patient progress toward functional and medical goals.  Function:  Bathing Bathing position Bathing activity did not occur:  (declined) Position: Shower  Bathing parts Body parts bathed by patient: Right arm, Left arm, Chest, Abdomen, Front perineal area, Buttocks, Right upper leg, Left upper leg, Right lower leg, Left lower leg Body parts bathed by helper: Back  Bathing assist Assist Level: Supervision or verbal cues (used grab bars for support in standing)      Upper Body Dressing/Undressing Upper body dressing   What is the patient wearing?: Pull over shirt/dress, Button up shirt     Pull over shirt/dress - Perfomed by patient: Thread/unthread right sleeve, Thread/unthread left sleeve, Put head through opening, Pull shirt over trunk   Button up shirt - Perfomed by patient: Thread/unthread right sleeve, Thread/unthread left sleeve, Pull shirt around back, Button/unbutton shirt      Upper body assist Assist Level: Supervision or verbal cues (S to gather clothing himself)   Set up : To obtain clothing/put away  Lower Body Dressing/Undressing Lower body dressing   What is the patient wearing?: Pants, Shoes, Socks     Pants- Performed by patient: Thread/unthread right  pants leg, Thread/unthread left pants leg, Pull pants up/down Pants- Performed by helper: Thread/unthread left pants leg, Thread/unthread right pants leg, Pull pants up/down Non-skid slipper socks- Performed by patient: Don/doff  right sock, Don/doff left sock   Socks - Performed by patient: Don/doff right sock, Don/doff left sock   Shoes - Performed by patient: Don/doff right shoe, Don/doff left shoe, Fasten right, Fasten left            Lower body assist Assist for lower body dressing: Touching or steadying assistance (Pt > 75%)      Toileting Toileting   Toileting steps completed by patient: Adjust clothing prior to toileting, Performs perineal hygiene, Adjust clothing after toileting Toileting steps completed by helper: Adjust clothing after toileting Toileting Assistive Devices: Grab bar or rail  Toileting assist Assist level: Touching or steadying assistance (Pt.75%)   Transfers Chair/bed transfer Chair/bed transfer activity did not occur: N/A Chair/bed transfer method: Ambulatory Chair/bed transfer assist level: Touching or steadying assistance (Pt > 75%) Chair/bed transfer assistive device: Armrests, Medical sales representative     Max distance: 150 Assist level: Touching or steadying assistance (Pt > 75%)   Wheelchair Wheelchair activity did not occur: N/A Type: Manual Max wheelchair distance: >500 Assist Level: Supervision or verbal cues  Cognition Comprehension Comprehension assist level: Understands basic 90% of the time/cues < 10% of the time  Expression Expression assist level: Expresses basic 90% of the time/requires cueing < 10% of the time.  Social Interaction       Social Interaction assist level: Interacts appropriately with others with medication or extra time (anti-anxiety, antidepressant).  Problem Solving Problem solving assist level: Solves basic 90% of the time/requires cueing < 10% of the time  Memory Memory assist level: Recognizes or recalls 90% of the time/requires cueing < 10% of the time   Medical Problem List and Plan: 1. Weakness secondary to SDH after fall,was anticoagulated at time of fall  Hx right acoustic neuroma  -still needing supervision due to  safety/balance. Fortunately he has some assistance at home. 2. DVT Prophylaxis/Anticoagulation: SCD, ambulating daily 3. Pain Management: Tylenol as needed 4. Mood: Ativan 0.5 mg every 6 hours as needed, Wellbutrin 100 mg twice a day, 5. Neuropsych: This patient is capable of making decisions on his own behalf. 6. Skin/Wound Care: Routine skin checks. Skin remains intact 7. Fluids/Electrolytes/Nutrition: encourage po. Good intake at present 8. History of atrial fibrillation. Chronic Coumadin discontinued secondary to subdural hematoma. Continue Cardizem 240 mg daily. Cardiac rate elevated will increase toprol xl 9. Hypertension. Toprol XL 50mg , Norvasc 5 mg daily, may need to reduce to 2.5mg  if BP drops too low. 10. Diabetes mellitus with peripheral neuropathy. Hemoglobin A1c 5.5. Sugars under control at present 12. BPH. Flomax 0.4 mg daily. Voiding without issue   LOS (Days) 7 A FACE TO FACE EVALUATION WAS PERFORMED  SWARTZ,ZACHARY T 10/03/2015 9:08 AM

## 2015-10-03 NOTE — Progress Notes (Signed)
Social Work Patient ID: [name], male   DOB: June 15, 1941, 74 y.o.   MRN: [id number]  Lowella Curb, LCSW Social Worker Signed  Patient Care Conference 10/03/2015  4:15 PM    Expand All Collapse All   Inpatient RehabilitationTeam Conference and Plan of Care Update Date: 10/02/2015   Time: 2:30 PM     Patient Name: Tommy Weeks       Medical Record Number: [id number]  Date of Birth: 09/25/1941 Sex: Male         Room/Bed: 4M13C/4M13C-01 Payor Info: Payor: MEDICARE / Plan: MEDICARE PART A AND B / Product Type: *No Product type* /    Admitting Diagnosis: Traumatic R SDH   Admit Date/Time:  09/26/2015  4:55 PM Admission Comments: No comment available   Primary Diagnosis:  <principal problem not specified> Principal Problem: <principal problem not specified>    Patient Active Problem List     Diagnosis  Date Noted   .  Traumatic subdural hematoma without loss of consciousness ([place])     .  Frequent falls  09/26/2015   .  Atrial fibrillation with RVR ([place])  09/26/2015   .  Essential hypertension  09/26/2015   .  Hypokalemia  09/26/2015   .  Hypomagnesemia  09/26/2015   .  BPH (benign prostatic hyperplasia)  09/26/2015   .  Controlled diabetes mellitus ([place])  09/26/2015   .  Traumatic subdural hematoma ([place])  09/26/2015   .  Anxiety and depression     .  Paroxysmal atrial fibrillation ([facility])     .  Type 2 diabetes mellitus with diabetic neuropathy ([place])     .  Weakness     .  Warfarin-induced coagulopathy ([place])  09/23/2015   .  SDH (subdural hematoma) ([place])  09/23/2015   .  Right acoustic neuroma ([place])  08/06/2015   .  Protein-calorie malnutrition, severe ([place])  07/06/2015   .  Atrial fibrillation with rapid ventricular response ([place])  07/04/2015   .  Urinary retention  04/04/2013   .  Atrial fibrillation ([place])  03/31/2013   .  Diabetes mellitus ([place])  03/31/2013   .  Hypertension  03/31/2013   .  Abdominal or pelvic swelling, mass, or lump, other specified site  03/07/2013      Expected Discharge Date: Expected Discharge Date: 10/06/15  Team Members Present: Physician leading conference: Dr. Alger Simons Social Worker Present: Lennart Pall, LCSW Nurse Present: Heather Roberts, RN PT Present: Dwyane Dee, PT;Rebecca Jari Favre, PT OT Present: Willeen Cass, OT SLP Present: Weston Anna, SLP PPS Coordinator present : Daiva Nakayama, RN, CRRN        Current Status/Progress  Goal  Weekly Team Focus   Medical     ongoing balance issues. strength improving, remains motivated. HR generallly controlled  improve safety awareness, balance  HR control, nutrition, vestibular rx    Bowel/Bladder     continent of bowel and bladder  remain continent of bowel and bladder   educate about the s/s of constipation    Swallow/Nutrition/ Hydration               ADL's     steadying A with self care, min A with transfers and ambulation in room during self care with several LOB during session   mod I for BADLs and supervision with simple meal prep  NMR for LE/trunk, standing balance, activity tolerance, ADL retraining, pt education   Mobility     min A overall without device,  decreased activity tolerance   mod I, will most likely need downgraded to supervision  NMR, standing balance, activity tolerance, vestibular rehab, functional strengthening, pt/family education   Communication               Safety/Cognition/ Behavioral Observations              Pain     patient makes no report of pain  pain equal to or less than 4 on a scale of 0-10  assess pain q4h   Skin     stage II to coccyx, boggy right heel   no new skin injury/breakdown  assess skin q shift, foam to buttocks and right heel, change as needed    Rehab Goals Patient on target to meet rehab goals: Yes Rehab Goals Revised: But some goals may need to be downgraded per tx. *See Care Plan and progress notes for long and short-term goals.    Barriers to Discharge:  ongoing vestibular deficits     Possible  Resolutions to Barriers:   activity modifcation, vestibular rx, adaptive equipment      Discharge Planning/Teaching Needs:   Home with family having arranged 24/7 assist for pt's wife and also to be available to pt as needed.  To be scheduled prior to d/c.    Team Discussion:    Making excellent progress and on target to meet supervision goals.  Currently some min assist needed when up.  No concerns.   Revisions to Treatment Plan:    None    Continued Need for Acute Rehabilitation Level of Care: The patient requires daily medical management by a physician with specialized training in physical medicine and rehabilitation for the following conditions: Daily direction of a multidisciplinary physical rehabilitation program to ensure safe treatment while eliciting the highest outcome that is of practical value to the patient.: Yes Daily medical management of patient stability for increased activity during participation in an intensive rehabilitation regime.: Yes Daily analysis of laboratory values and/or radiology reports with any subsequent need for medication adjustment of medical intervention for : Neurological problems;Other  Ashana Tullo 10/03/2015, 4:16 PM

## 2015-10-03 NOTE — Progress Notes (Signed)
Physical Therapy Session Note  Patient Details  Name: Tommy Weeks MRN: [id number] Date of Birth: January 17, 1941  Today's Date: 10/03/2015 PT Individual Time: 1000-1100, 1350-1445, and 1620-1700 PT Individual Time Calculation (min): 60 min, 50 min, and 40 min   Short Term Goals: Week 1:  PT Short Term Goal 1 (Week 1): Patient will ambulate 150 ft with supervision.  PT Short Term Goal 2 (Week 1): Patient will transfer bed <> wheelchair with supervision.  PT Short Term Goal 3 (Week 1): Patient will maintain standing balance during functional task x 3 min with supervision.  PT Short Term Goal 4 (Week 1): Patient will perform floor transfer with min A.   Skilled Therapeutic Interventions/Progress Updates:   Treatment 1: Focus on standing balance, gait, and activity tolerance. Patient sitting edge of bed donning shoes in preparation for therapy. Gait using RW from room to [facility] gym x 500 ft with close supervision and no LOB noted. Performed Dynavision mode A in standing to challenge dynamic balance and standing tolerance for 3 trials: 33 hits and 1.82 sec avg reaction time for first 2 trials and last trial 36 hits and 1.67 sec avg reaction time with no LOB during trials and 1 seated rest break due to fatigue. In mode B with 1.5 sec red and green lights, patient made no errors and scored 9/29 with avg reaction time 1.27 sec with increase in dizziness. Following Dynavision, patient reported feeling "wiped." Patient stood to set up checkerboard and start game with supervision x 3.5 min + 4 min before requiring seated rest break. Patient required max cues to set up board correctly and min cues to adhere to game rules. Patient ambulated back to room using RW with one seated rest break at chairs by elevator. Patient required multiple prolonged rest breaks throughout session due to fatigue. Patient left sitting edge of bed with all needs within reach.   Treatment 2: Focus on vestibular rehab, standing tolerance,  standing balance, and endurance. Patient sitting edge of bed with NT after toileting. Patient ambulated to and from gym using RW supervision and cues for safe hand placement with sit <> stand. Performed adaptation exercise of x1 viewing in sitting, 2 sets of 30-60 seconds each vertical and horizontal head movement with cues to prevent from moving shoulders to isolate cervical rotation. Patient appeared to have improved gaze stability with horizontal turns and increased difficulty with vertical nods. Performed substitution exercises looking between two targets high <> low for eye > head turns to target with sit to and from stand from edge of mat with supervision, 2 x 10. Patient required multiple seated rest breaks and one supine rest break due to fatigue. Performed Wii Bowling to challenge standing balance and endurance with close supervision-max A for unsupported dynamic standing with seated rest breaks approx every 1-3 minutes. Patient ambulated back to room and left sitting edge of bed with all needs within reach.   Treatment 3: Focus on strengthening and activity tolerance. Patient resting in bed, sat edge of bed and donned shoes with mod I. Patient ambulated using RW to and from gym with supervision and 1 LOB when patient attempted to rub eye while walking requiring mod A to prevent fall. Educated patient on stopping before removing UE support from RW. Performed NuStep using BUE and BLE at level 3 > 2 x 10 min total with multiple rest breaks as needed. Seated in armchair, performed BUE therex using 3# dowel bar x 15 each exercise: bicep curls, chest  presses, and diagonals to R and L. Patient fatigued easily and required frequent rest breaks. Patient returned to room and left supine in bed with all needs within reach.   Therapy Documentation Precautions:  Precautions Precautions: Fall Precaution Comments: hx of R facial paralysis Restrictions Weight Bearing Restrictions: No Pain: Pain  Assessment Pain Assessment: No/denies pain  See Function Navigator for Current Functional Status.   Therapy/Group: Individual Therapy  [name] 10/03/2015, 10:46 AM

## 2015-10-03 NOTE — Patient Care Conference (Signed)
Inpatient RehabilitationTeam Conference and Plan of Care Update Date: 10/02/2015   Time: 2:30 PM    Patient Name: Tommy Weeks      Medical Record Number: [id number]  Date of Birth: 1941/05/02 Sex: Male         Room/Bed: 4M13C/4M13C-01 Payor Info: Payor: MEDICARE / Plan: MEDICARE PART A AND B / Product Type: *No Product type* /    Admitting Diagnosis: Traumatic R SDH  Admit Date/Time:  09/26/2015  4:55 PM Admission Comments: No comment available   Primary Diagnosis:  <principal problem not specified> Principal Problem: <principal problem not specified>  Patient Active Problem List   Diagnosis Date Noted  . Traumatic subdural hematoma without loss of consciousness ([place])   . Frequent falls 09/26/2015  . Atrial fibrillation with RVR ([place]) 09/26/2015  . Essential hypertension 09/26/2015  . Hypokalemia 09/26/2015  . Hypomagnesemia 09/26/2015  . BPH (benign prostatic hyperplasia) 09/26/2015  . Controlled diabetes mellitus ([place]) 09/26/2015  . Traumatic subdural hematoma ([place]) 09/26/2015  . Anxiety and depression   . Paroxysmal atrial fibrillation ([facility])   . Type 2 diabetes mellitus with diabetic neuropathy ([place])   . Weakness   . Warfarin-induced coagulopathy ([place]) 09/23/2015  . SDH (subdural hematoma) ([place]) 09/23/2015  . 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 Protein-calorie malnutrition, severe ([place]) 07/06/2015  . Atrial fibrillation with rapid ventricular response ([place]) 07/04/2015  . Urinary retention 04/04/2013  . Atrial fibrillation ([place]) 03/31/2013  . Diabetes mellitus ([place]) 03/31/2013  . Hypertension 03/31/2013  . Abdominal or pelvic swelling, mass, or lump, other specified site 03/07/2013    Expected Discharge Date: Expected Discharge Date: 10/06/15  Team Members Present: Physician leading conference: Dr. Alger Simons Social Worker Present: Lennart Pall, LCSW Nurse Present: Heather Roberts, RN PT Present: Dwyane Dee, PT;Rebecca Jari Favre, PT OT Present:  Willeen Cass, OT SLP Present: Weston Anna, SLP PPS Coordinator present : Daiva Nakayama, RN, CRRN     Current Status/Progress Goal Weekly Team Focus  Medical   ongoing balance issues. strength improving, remains motivated. HR generallly controlled  improve safety awareness, balance  HR control, nutrition, vestibular rx   Bowel/Bladder   continent of bowel and bladder  remain continent of bowel and bladder  educate about the s/s of constipation   Swallow/Nutrition/ Hydration             ADL's   steadying A with self care, min A with transfers and ambulation in room during self care with several LOB during session  mod I for BADLs and supervision with simple meal prep  NMR for LE/trunk, standing balance, activity tolerance, ADL retraining, pt education   Mobility   min A overall without device, decreased activity tolerance  mod I, will most likely need downgraded to supervision  NMR, standing balance, activity tolerance, vestibular rehab, functional strengthening, pt/family education   Communication             Safety/Cognition/ Behavioral Observations            Pain   patient makes no report of pain  pain equal to or less than 4 on a scale of 0-10  assess pain q4h   Skin   stage II to coccyx, boggy right heel  no new skin injury/breakdown  assess skin q shift, foam to buttocks and right heel, change as needed    Rehab Goals Patient on target to meet rehab goals: Yes Rehab Goals Revised: But some goals may need to be downgraded per tx. *See Care Plan and  progress notes for long and short-term goals.  Barriers to Discharge: ongoing vestibular deficits    Possible Resolutions to Barriers:  activity modifcation, vestibular rx, adaptive equipment    Discharge Planning/Teaching Needs:  Home with family having arranged 24/7 assist for pt's wife and also to be available to pt as needed.  To be scheduled prior to d/c.   Team Discussion:  Making excellent progress and on target  to meet supervision goals.  Currently some min assist needed when up.  No concerns.  Revisions to Treatment Plan:  None   Continued Need for Acute Rehabilitation Level of Care: The patient requires daily medical management by a physician with specialized training in physical medicine and rehabilitation for the following conditions: Daily direction of a multidisciplinary physical rehabilitation program to ensure safe treatment while eliciting the highest outcome that is of practical value to the patient.: Yes Daily medical management of patient stability for increased activity during participation in an intensive rehabilitation regime.: Yes Daily analysis of laboratory values and/or radiology reports with any subsequent need for medication adjustment of medical intervention for : Neurological problems;Other  Tommy Weeks 10/03/2015, 4:16 PM

## 2015-10-04 ENCOUNTER — Inpatient Hospital Stay (HOSPITAL_COMMUNITY): Payer: Medicare Other | Admitting: Physical Therapy

## 2015-10-04 ENCOUNTER — Inpatient Hospital Stay (HOSPITAL_COMMUNITY): Payer: Medicare Other | Admitting: Occupational Therapy

## 2015-10-04 ENCOUNTER — Encounter: Payer: Self-pay | Admitting: Physical Medicine & Rehabilitation

## 2015-10-04 LAB — GLUCOSE, CAPILLARY
GLUCOSE-CAPILLARY: 88 mg/dL (ref 65–99)
GLUCOSE-CAPILLARY: 98 mg/dL (ref 65–99)
GLUCOSE-CAPILLARY: 91 mg/dL (ref 65–99)
Glucose-Capillary: 100 mg/dL — ABNORMAL HIGH (ref 65–99)

## 2015-10-04 NOTE — Progress Notes (Signed)
Physical Therapy Session Note  Patient Details  Name: Tommy Weeks MRN: [id number] Date of Birth: Jun 02, 1941  Today's Date: 10/04/2015 PT Individual Time: 1005-1100, 1325-1430, and 1535-1600 PT Individual Time Calculation (min): 55 min, 65 min, and 25 min   Short Term Goals: Week 1:  PT Short Term Goal 1 (Week 1): Patient will ambulate 150 ft with supervision.  PT Short Term Goal 2 (Week 1): Patient will transfer bed <> wheelchair with supervision.  PT Short Term Goal 3 (Week 1): Patient will maintain standing balance during functional task x 3 min with supervision.  PT Short Term Goal 4 (Week 1): Patient will perform floor transfer with min A.   Skilled Therapeutic Interventions/Progress Updates:   Treatment 1: Patient resting in bed, transferred to edge of bed and donned shoes with mod I. Patient ambulated to gym using RW x 150 ft with supervision. Patient ambulated throughout gym using RW to work on obstacle negotiation and safety with functional reaching for horseshoes at and above eye level with supervision-min A. Patient required max cues to avoid obstacles as he would frequently run into things with RW and cues to widen BOS and face objects while reaching as he would keep a narrow BOS when reaching to side requiring steady A. Patient with episode of "wooziness" in standing, seated BP 88/57, HR 103. Patient given supine rest break and requested snack. Returned to sitting after 10 min rest and BP 69/55, RN notified of low BP. Supine BLE therex for strengthening 2 x 10 each exercise: SLR, bridging while squeezing ball between knees, hip adduction with ball squeeze, gentle lumbar rotation R/L for core strengthening. Rechecked seated BP 79/57, HR 48. RN and PA made aware. Patient returned to room in wheelchair total A and left supine in bed with needs within reach and RN present.   Treatment 2: Patient resting in bed, reporting feeling much better, donned thigh high TED hose and shoes with  total A seated edge of bed. Patient ambulated to gym using RW with supervision. Seated BP 104/65, HR 100. Stair training up/down 8 (6") stairs using BUE on R rail ascending with supervision-steady A and cues not to cross lower extremities when descending but lead with outside lower extremity and step-to pattern. BLE therex for strengthening and muscular endurance using RW for UE support in standing: seated LAQ 2 x 10, standing marching x 20, standing heel raises 2 x 10, standing knee flexion x 10 each LE, and standing hip abduction x 10 each LE. Patient required seated rest breaks between exercises. Patient reported having laundry in dryer. Patient ambulated to laundry room using RW and stood to retrieve clothing items from dryer with supervision and cues for safety. Patient required seated rest before ambulating back to room approx 200 ft using RW with supervision. Patient sat edge of bed to sort and fold laundry and ambulated around the room with min A to put away clothing. Patient left in bed with all needs within reach.   Treatment 3: Focus on functional strengthening, vestibular rehab, and activity tolerance. Patient resting in bed upon arrival. Gait training using RW to and from gym 2 x 150 ft with supervision. NuStep using BUE/BLE at level 2 x 5 min without rest breaks. Seated edge of mat, performed adaptation exercise of x1 viewing in sitting holding target in front of him, 3 sets of 45-60 seconds each vertical and horizontal head movement. Patient continued to demonstrate improved gaze stability with horizontal head turns and increased difficulty keeping  eyes on target with vertical head nods. Patient ambulated back to room and requested to toilet with supervision using RW and left supine in bed with all needs within reach.   Therapy Documentation Precautions:  Precautions Precautions: Fall Precaution Comments: hx of R facial paralysis Restrictions Weight Bearing Restrictions: No Vital  Signs: Therapy Vitals Pulse Rate: (!) 48 BP: (!) 79/57 mmHg Patient Position (if appropriate): Sitting Oxygen Therapy SpO2: 95 % O2 Device: Not Delivered Pain: Pain Assessment Pain Assessment: No/denies pain   See Function Navigator for Current Functional Status.   Therapy/Group: Individual Therapy  [name] 10/04/2015, 11:13 AM

## 2015-10-04 NOTE — Plan of Care (Signed)
Problem: RH Balance Goal: LTG Patient will maintain dynamic standing with ADLs (OT) LTG: Patient will maintain dynamic standing balance with assist during activities of daily living (OT)  LTG modified to supervision for pt safety.  Problem: RH Grooming Goal: LTG Patient will perform grooming w/assist,cues/equip (OT) LTG: Patient will perform grooming with assist, with/without cues using equipment (OT)  LTG modified to supervision for pt safety.  Problem: RH Bathing Goal: LTG Patient will bathe with assist, cues/equipment (OT) LTG: Patient will bathe specified number of body parts with assist with/without cues using equipment (position) (OT)  LTG modified to supervision for pt safety.  Problem: RH Dressing Goal: LTG Patient will perform upper body dressing (OT) LTG Patient will perform upper body dressing with assist, with/without cues (OT).  LTG modified to supervision for pt safety. Goal: LTG Patient will perform lower body dressing w/assist (OT) LTG: Patient will perform lower body dressing with assist, with/without cues in positioning using equipment (OT)  LTG modified to supervision for pt safety.  Problem: RH Toileting Goal: LTG Patient will perform toileting w/assist, cues/equip (OT) LTG: Patient will perform toiletiing (clothes management/hygiene) with assist, with/without cues using equipment (OT)  LTG modified to supervision for pt safety.  Problem: RH Toilet Transfers Goal: LTG Patient will perform toilet transfers w/assist (OT) LTG: Patient will perform toilet transfers with assist, with/without cues using equipment (OT)  LTG modified to supervision for pt safety.  Problem: RH Tub/Shower Transfers Goal: LTG Patient will perform tub/shower transfers w/assist (OT) LTG: Patient will perform tub/shower transfers with assist, with/without cues using equipment (OT)  LTG modified to supervision for pt safety.

## 2015-10-04 NOTE — Progress Notes (Signed)
Physical Therapy Make-up Session Note  Patient Details  Name: Tommy Weeks MRN: [id number] Date of Birth: November 16, 1940  Today's Date: 10/04/2015 PT Individual Time: 0815-0830 PT Individual Time Calculation (min): 15 min   Short Term Goals: Week 1:  PT Short Term Goal 1 (Week 1): Patient will ambulate 150 ft with supervision.  PT Short Term Goal 2 (Week 1): Patient will transfer bed <> wheelchair with supervision.  PT Short Term Goal 3 (Week 1): Patient will maintain standing balance during functional task x 3 min with supervision.  PT Short Term Goal 4 (Week 1): Patient will perform floor transfer with min A.   Skilled Therapeutic Interventions/Progress Updates:    Session focused on functional household gait training with RW, transfers, bed mobility, balance activities, and overall endurance while pt performing mobility in the room to gather clothing, towels, and items to prepare for OT session. Pt able to bend down into drawers with close S and 1 UE support on RW to get items out of drawer and perform oral hygiene at the sink with supervision in standing position. Gait on unit for overall mobility training at supervision level with RW.   Therapy Documentation Precautions:  Precautions Precautions: Fall Precaution Comments: hx of R facial paralysis Restrictions Weight Bearing Restrictions: No  Pain:  Denies pain.   See Function Navigator for Current Functional Status.   Therapy/Group: Individual Therapy  Canary Brim Ivory Broad, PT, DPT  10/04/2015, 8:38 AM

## 2015-10-04 NOTE — Progress Notes (Signed)
Occupational Therapy Session Note  Patient Details  Name: Tommy Weeks MRN: [id number] Date of Birth: 02-05-1941  Today's Date: 10/04/2015 OT Individual Time: 3762-8315 OT Individual Time Calculation (min): 60 min    Short Term Goals: Week 1:  OT Short Term Goal 1 (Week 1): Pt will transfer into shower stall with min A  OT Short Term Goal 2 (Week 1): Pt will self correct LOB with min A 50% OT Short Term Goal 3 (Week 1): Pt will perform 3 grooming tasks in standing with supervision.  Skilled Therapeutic Interventions/Progress Updates:    Pt seen for skilled OT to facilitate activity tolerance and dynamic balance with self care to include standing to shave and groom, toileting, shower, dressing, laundry, and retrieving items in kitchen for his microwave meals and sandwiches he makes at home.  Pt states he does not use the oven or stovetop, only quick and easy meal prep.  Overall, pt requires supervision with all tasks and mod verbal safety cues at times as he continues to reach out of his BOS with his feet too close together putting him at high fall risk. He also needs cuing to navigate the RW as he will tend to run into things or push RW too close to objects. No visual deficits noted on eval but pt may have some peripheral field impairments.  Pt needed 2 short rest breaks during session but was otherwise able to participate well. Pt taken back to room to rest prior to his PT session.  All needs met.  LTGs downgraded from mod I to S as pt continues to be a high fall risk.  Therapy Documentation Precautions:  Precautions Precautions: Fall Precaution Comments: hx of R facial paralysis Restrictions Weight Bearing Restrictions: No    Vital Signs: Therapy Vitals Pulse Rate: (!) 107 BP: 124/70 mmHg Pain: Pain Assessment Pain Assessment: No/denies pain ADL:    See Function Navigator for Current Functional Status.  Therapy/Group: Individual Therapy  SAGUIER,JULIA 10/04/2015, 9:42  AM

## 2015-10-04 NOTE — Progress Notes (Signed)
[facility]ICINE & REHABILITATION     PROGRESS NOTE    Subjective/Complaints: Slept well again. No new complaints.   ROS: Pt denies fever, rash/itching, headache, blurred or double vision, nausea, vomiting, abdominal pain, diarrhea, chest pain, shortness of breath,   dysuria, dizziness, neck or back pain, bleeding, anxiety, or depression  Objective: Vital Signs: Blood pressure 124/70, pulse 107, temperature 99.2 F (37.3 C), temperature source Oral, resp. rate 18, height 6' (1.829 m), weight 61.236 kg (135 lb), SpO2 97 %. No results found. No results for input(s): WBC, HGB, HCT, PLT in the last 72 hours. No results for input(s): NA, K, CL, GLUCOSE, BUN, CREATININE, CALCIUM in the last 72 hours.  Invalid input(s): CO CBG (last 3)   Recent Labs  10/03/15 1710 10/03/15 2138 10/04/15 0611  GLUCAP 96 86 88    Wt Readings from Last 3 Encounters:  09/26/15 61.236 kg (135 lb)  09/26/15 63.594 kg (140 lb 3.2 oz)  08/06/15 67.268 kg (148 lb 4.8 oz)    Physical Exam:  Constitutional: He is oriented to person, place, and time.  HENT:  OD with decreased conjunctival irritation---no drainage Normocephalic. Atraumatic Eyes: EOM and Conj are normal.  eye clear Neck: Normal range of motion. Neck supple. No thyromegaly present.  Cardiovascular: Irregular irregular , rate controlled in 70's to 80's Respiratory: Effort normal and breath sounds normal. No respiratory distress.  GI: Soft. Bowel sounds are normal. He exhibits no distension.  Neurological: He is alert and oriented to person, place, and time. He exhibits normal muscle tone.  Follows full commands.  Good insight and awareness.  Right facial weakness. No hearing on right.  B/L UE: 4/5 proximal to distal B/L LE: 3 to 3+/5 Hip flexion, 4/5 knee extension, ankle dorsi/plantar flexion Decreased sensation to light touch distal LE below the knees bilaterally is persistent 3+ DTR RLE and RUE. Skin: Skin is  warm and dry. A few small abrasions noted Psychiatric: He has a normal mood and affect. His behavior is normal   Assessment/Plan: 1. 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 Physiatrist is providing close team supervision and 24 hour management of active medical problems listed below. Physiatrist and rehab team continue to assess barriers to discharge/monitor patient progress toward functional and medical goals.  Function:  Bathing Bathing position Bathing activity did not occur:  (declined) Position: Shower  Bathing parts Body parts bathed by patient: Right arm, Left arm, Chest, Abdomen, Front perineal area, Buttocks, Right upper leg, Left upper leg, Right lower leg, Left lower leg Body parts bathed by helper: Back  Bathing assist Assist Level: Supervision or verbal cues      Upper Body Dressing/Undressing Upper body dressing   What is the patient wearing?: Pull over shirt/dress, Button up shirt     Pull over shirt/dress - Perfomed by patient: Thread/unthread right sleeve, Thread/unthread left sleeve, Put head through opening, Pull shirt over trunk   Button up shirt - Perfomed by patient: Thread/unthread right sleeve, Thread/unthread left sleeve, Pull shirt around back, Button/unbutton shirt      Upper body assist Assist Level: Supervision or verbal cues (to gather clothing himself)   Set up : To obtain clothing/put away  Lower Body Dressing/Undressing Lower body dressing   What is the patient wearing?: Pants, Shoes, Socks     Pants- Performed by patient: Thread/unthread right pants leg, Thread/unthread left pants leg, Pull pants  up/down Pants- Performed by helper: Thread/unthread left pants leg, Thread/unthread right pants leg, Pull pants up/down Non-skid slipper socks- Performed by patient: Don/doff right sock, Don/doff left sock   Socks -  Performed by patient: Don/doff right sock, Don/doff left sock   Shoes - Performed by patient: Don/doff right shoe, Don/doff left shoe, Fasten right, Fasten left            Lower body assist Assist for lower body dressing: Supervision or verbal cues      Toileting Toileting   Toileting steps completed by patient: Adjust clothing prior to toileting, Performs perineal hygiene, Adjust clothing after toileting Toileting steps completed by helper: Adjust clothing after toileting Toileting Assistive Devices: Grab bar or rail  Toileting assist Assist level: Touching or steadying assistance (Pt.75%)   Transfers Chair/bed transfer Chair/bed transfer activity did not occur: N/A Chair/bed transfer method: Ambulatory Chair/bed transfer assist level: Touching or steadying assistance (Pt > 75%) Chair/bed transfer assistive device: Armrests, Medical sales representative     Max distance: 150 Assist level: Supervision or verbal cues   Wheelchair Wheelchair activity did not occur: N/A Type: Manual Max wheelchair distance: >500 Assist Level: Supervision or verbal cues  Cognition Comprehension Comprehension assist level: Understands basic 90% of the time/cues < 10% of the time  Expression Expression assist level: Expresses basic 90% of the time/requires cueing < 10% of the time.  Social Interaction       Social Interaction assist level: Interacts appropriately with others with medication or extra time (anti-anxiety, antidepressant).  Problem Solving Problem solving assist level: Solves basic 90% of the time/requires cueing < 10% of the time  Memory Memory assist level: Recognizes or recalls 90% of the time/requires cueing < 10% of the time   Medical Problem List and Plan: 1. Weakness secondary to SDH after fall,was anticoagulated at time of fall  Hx right acoustic neuroma  -supervision goals 2. DVT Prophylaxis/Anticoagulation: SCD, ambulating daily 3. Pain Management: Tylenol as  needed 4. Mood: Ativan 0.5 mg every 6 hours as needed, Wellbutrin 100 mg twice a day, 5. Neuropsych: This patient is capable of making decisions on his own behalf. 6. Skin/Wound Care: Routine skin checks. Stage 2 breakdown in gluteal cleft---foam dressing/pressure relief, nutrition/protein 7. Fluids/Electrolytes/Nutrition: encourage po. Good intake at present 8. History of atrial fibrillation. Chronic Coumadin discontinued secondary to subdural hematoma. Continue Cardizem 240 mg daily. Cardiac rate elevated will increase toprol xl 9. Hypertension. Toprol XL 50mg , Norvasc 5 mg daily, may need to reduce to 2.5mg  if BP drops too low. 10. Diabetes mellitus with peripheral neuropathy. Hemoglobin A1c 5.5. Sugars under control at present 12. BPH. Flomax 0.4 mg daily. Voiding without issue   LOS (Days) 8 A FACE TO FACE EVALUATION WAS PERFORMED  SWARTZ,ZACHARY T 10/04/2015 8:40 AM

## 2015-10-04 NOTE — Progress Notes (Signed)
10/04/15 1049 nursing  Per therapy pt's bp was 88/57 HR 103 ; pt claims he feels tired and is in bed after therapy. Dan PA notified new orders noted.

## 2015-10-04 NOTE — Plan of Care (Signed)
Problem: RH SKIN INTEGRITY Goal: RH STG SKIN FREE OF INFECTION/BREAKDOWN Patient's skin will remain free from new infection/breakdown while on Rehab.  Outcome: Not Progressing Foam dressings on lt foot (boggy) ; foam dressings on gluteal fold

## 2015-10-05 ENCOUNTER — Inpatient Hospital Stay (HOSPITAL_COMMUNITY): Payer: Medicare Other | Admitting: Physical Therapy

## 2015-10-05 ENCOUNTER — Inpatient Hospital Stay (HOSPITAL_COMMUNITY): Payer: Medicare Other | Admitting: Occupational Therapy

## 2015-10-05 LAB — GLUCOSE, CAPILLARY
GLUCOSE-CAPILLARY: 97 mg/dL (ref 65–99)
Glucose-Capillary: 95 mg/dL (ref 65–99)
Glucose-Capillary: 86 mg/dL (ref 65–99)
Glucose-Capillary: 114 mg/dL — ABNORMAL HIGH (ref 65–99)
Glucose-Capillary: 102 mg/dL — ABNORMAL HIGH (ref 65–99)

## 2015-10-05 LAB — BASIC METABOLIC PANEL
Anion gap: 10 (ref 5–15)
BUN: 17 mg/dL (ref 6–20)
CHLORIDE: 96 mmol/L — AB (ref 101–111)
CO2: 32 mmol/L (ref 22–32)
Calcium: 9.1 mg/dL (ref 8.9–10.3)
Creatinine, Ser: 1.21 mg/dL (ref 0.61–1.24)
GFR calc Af Amer: >60 mL/min (ref >60)
GFR, EST NON AFRICAN AMERICAN: 56 mL/min — AB (ref >60)
GLUCOSE: 108 mg/dL — AB (ref 65–99)
POTASSIUM: 3.7 mmol/L (ref 3.5–5.1)
Sodium: 138 mmol/L (ref 135–145)

## 2015-10-05 LAB — CBC
HEMATOCRIT: 35 % — AB (ref 39.0–52.0)
HEMOGLOBIN: 11 g/dL — AB (ref 13.0–17.0)
MCH: 26.5 pg (ref 26.0–34.0)
MCHC: 31.4 g/dL (ref 30.0–36.0)
MCV: 84.3 fL (ref 78.0–100.0)
Platelets: 312 10*3/uL (ref 150–400)
RBC: 4.15 MIL/uL — ABNORMAL LOW (ref 4.22–5.81)
RDW: 15.7 % — AB (ref 11.5–15.5)
WBC: 4.3 10*3/uL (ref 4.0–10.5)

## 2015-10-05 MED ORDER — LORAZEPAM 0.5 MG PO TABS: 0.5000 mg | ORAL_TABLET | Freq: Four times a day (QID) | ORAL | Status: AC | PRN

## 2015-10-05 MED ORDER — FENOFIBRATE 145 MG PO TABS: 145.0000 mg | ORAL_TABLET | Freq: Every day | ORAL | Status: AC

## 2015-10-05 MED ORDER — AMLODIPINE BESYLATE 2.5 MG PO TABS: 2.5000 mg | ORAL_TABLET | Freq: Every morning | ORAL | Status: DC

## 2015-10-05 MED ORDER — SODIUM CHLORIDE 0.45 % IV SOLN
INTRAVENOUS | Status: DC
  Administered 2015-10-05 – 2015-10-06 (×2): via INTRAVENOUS

## 2015-10-05 MED ORDER — METOPROLOL SUCCINATE ER 100 MG PO TB24: 25.0000 mg | ORAL_TABLET | Freq: Every day | ORAL | Status: AC

## 2015-10-05 MED ORDER — DIGOXIN 250 MCG PO TABS: 0.2500 mg | ORAL_TABLET | Freq: Every day | ORAL | Status: AC

## 2015-10-05 MED ORDER — DILTIAZEM HCL ER COATED BEADS 240 MG PO CP24: 240.0000 mg | ORAL_CAPSULE | Freq: Every morning | ORAL | Status: DC

## 2015-10-05 MED ORDER — DIGOXIN 125 MCG PO TABS
0.2500 mg | ORAL_TABLET | ORAL | Status: AC
  Administered 2015-10-05 – 2015-10-06 (×3): 0.25 mg via ORAL
  Filled 2015-10-05 (×3): qty 2

## 2015-10-05 MED ORDER — DIGOXIN 125 MCG PO TABS
0.2500 mg | ORAL_TABLET | Freq: Every day | ORAL | Status: DC
Start: 2015-10-06 — End: 2015-10-08
  Administered 2015-10-06 – 2015-10-08 (×3): 0.25 mg via ORAL
  Filled 2015-10-05 (×4): qty 2

## 2015-10-05 MED ORDER — BUPROPION HCL 100 MG PO TABS: 100.0000 mg | ORAL_TABLET | Freq: Two times a day (BID) | ORAL | Status: AC

## 2015-10-05 MED ORDER — TAMSULOSIN HCL 0.4 MG PO CAPS: 0.4000 mg | ORAL_CAPSULE | Freq: Every day | ORAL | Status: AC

## 2015-10-05 MED ORDER — AMLODIPINE BESYLATE 2.5 MG PO TABS
2.5000 mg | ORAL_TABLET | Freq: Every morning | ORAL | Status: DC
Start: 2015-10-05 — End: 2015-10-05

## 2015-10-05 NOTE — Discharge Summary (Signed)
NAME:  Tommy Weeks, Tommy Weeks NO.:  [id number]  MEDICAL RECORD NO.:  [name]  LOCATION:  [id number]                        FACILITY:  [facility]  PHYSICIAN:  Tommy Weeks, M.D.DATE OF BIRTH:  05-29-1941  DATE OF ADMISSION:  09/26/2015 DATE OF DISCHARGE:  10/06/2015                              DISCHARGE SUMMARY   DISCHARGE DIAGNOSES: 1. Functional deficits secondary to subdural hematoma after a fall as     well as history of right acoustic neuroma. 2. SCDs for DVT prophylaxis. 3. Anxiety with depression. 4. History of atrial fibrillation. 5. Hypertension. 6. Diabetes mellitus, peripheral neuropathy. 7. Benign prostatic hyperplasia. 8. 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  He lives with his spouse who has reported dementia.  Presented September 23, 2015 with weakness in lower extremities and to a lesser extent in the upper extremities.  He does note three separate falls over the past 2 weeks hitting his head against the doorjamb in the bathroom.  INR on admission of 2.97.  CT of the brain showed a right frontal isodense subacute subdural hematoma without significant mass effect.  Coumadin was reversed.  Followup at Neurosurgery, Dr. Sherwood Gambler advised conservative care.  Latest cranial CT scan on September 25, 2015, overall appearance, smaller left-sided subdural hematoma without midline shift.  Tolerating a regular diet. Bouts of hypokalemia, 3.1, supplement added.  Magnesium 1.6.  Physical and occupational therapy on going.  The patient was admitted for a comprehensive rehab program.  PAST MEDICAL HISTORY:  See discharge diagnoses.  SOCIAL HISTORY:  Lives with spouse who has dementia and has a 24-hour caregiver.  The patient was  independent prior to admission.  Functional status upon admission to [name] was minimal assist, ambulate 70 feet rolling walker; minimal assist, sit to stand; min to mod assist, activities of daily living.  PHYSICAL EXAMINATION:  VITAL SIGNS:  Blood pressure 134/88, pulse 120, temperature 99, respirations 17. GENERAL:  This was an alert male, in no acute distress, oriented x3. LUNGS:  Clear to auscultation without wheeze. CARDIAC:  Rate controlled. ABDOMEN:  Soft, nontender.  Good bowel sounds.  REHABILITATION HOSPITAL COURSE:  The patient was admitted to Inpatient Rehab Services with therapies initiated on a 3-hour daily basis consisting of physical therapy, occupational therapy, and rehabilitation nursing.  The following issues were addressed during the patient's rehabilitation stay.  Pertaining to Mr. Klauser subdural hematoma after a fall, his chronic Coumadin had been reversed.  Conservative care per Neurosurgery.  He would follow up outpatient.  Noted history of right acoustic neuroma with facial paralysis.  He was tolerating a regular consistency diet.  He remained on Wellbutrin for history of depression.  Chronic atrial fibrillation; as noted, chronic Coumadin had been discontinued due to subdural hematoma. Follow-up cardiology service Dr. Nadyne Coombes for rapid ventricular response heart rate 105 to 115 and orthostasis. EKG nonspecific ST-T wave abnormalities. Cardizem and Norvasc were discontinued. Placed on Lanoxin 0.25 mg daily. Cardiac rate controlled. Plan for event monitor cardiology office.  Diabetes mellitus well controlled.  Hemoglobin A1c 5.5.  He did remain on Flomax for history of BPH, voiding without difficulty. History of pilonidal cyst sacral area with foam dressing in place no signs of infection. The patient received weekly collaborative interdisciplinary team conferences to discuss estimated length of stay, family teaching, and any barriers to his  discharge. Sessions focused on functional household, gait training with rolling walker, transfers, bed mobility, and balance activities.  The patient is able to bend down into drawers with close supervision using a rolling walker, ambulating extended distances with his walker.  He could gather his belongings for activities of daily living and homemaking.  Energy conservation techniques were discussed.  The patient progressed very nicely during his rehab stay and discharged to home with ongoing therapies dictated per [facility].  DISCHARGE MEDICATIONS:  Include , Wellbutrin 100 mg p.o. b.i.d., Lanoxin 0.25 mg daily, Ativan 0.5 mg every 6 hours as needed for anxiety, Toprol-XL 50 mg p.o. daily, Flomax 0.4 mg p.o. daily.  DIET:  Diabetic diet.  Special instructions. Foam dressing to sacral pilonidal cyst area changes needed  FOLLOWUP:  The patient would follow up with Dr. Alger Simons at the Outpatient Rehab Service office as directed; Dr. Ashok Pall, Neurosurgery, 2-weeks call for appointment; Dr. Arlyss Repress, Medical Management.     Lauraine Rinne, P.A.   ______________________________ Tommy Weeks, M.D.    DA/MEDQ  D:  10/05/2015  T:  10/05/2015  Job:  [id number]  cc:   Arlyss Repress, MD Ashok Pall, M.D.

## 2015-10-05 NOTE — Progress Notes (Signed)
Occupational Therapy Session Note  Patient Details  Name: Tommy Weeks MRN: [id number] Date of Birth: 03-14-1941  Today's Date: 10/05/2015 OT Individual Time: 0930-1020 OT Individual Time Calculation (min): 50 min (missed 10 min due to nursing care)   Short Term Goals: Week 1:  OT Short Term Goal 1 (Week 1): Pt will transfer into shower stall with min A  OT Short Term Goal 2 (Week 1): Pt will self correct LOB with min A 50% OT Short Term Goal 3 (Week 1): Pt will perform 3 grooming tasks in standing with supervision.  Skilled Therapeutic Interventions/Progress Updates:    Pt seen for skilled OT to facilitate functional mobility, balance, and activity tolerance with ADL retraining. Pt was feeling very weak this morning. MD aware and tests are pending. Pt cleared to work on activities in the room.  Pt did want to toilet 2x, shower, dress and complete grooming. He was able to complete all tasks with extra time and extra rest breaks. Pt used RW to move around room with S and no safety cues needed today.  Pt opted to sit to groom vs stand due to his fatigue levels. Nursing staff arrived to start an IV and EKG.  Pt got in bed with S.  Pt in bed with all needs met.   Therapy Documentation Precautions:  Precautions Precautions: Fall Precaution Comments: hx of R facial paralysis Restrictions Weight Bearing Restrictions: No  General OT Amount of Missed Time: 10 Minutes Vital Signs: Therapy Vitals Temp: 98.1 F (36.7 C) Temp Source: Oral Pulse Rate: 71 Resp: 18 BP: 119/74 mmHg Patient Position (if appropriate): Lying Oxygen Therapy SpO2: 94 % O2 Device: Not Delivered Pain: Pain Assessment Pain Assessment: No/denies pain ADL:  See Function Navigator for Current Functional Status.   Therapy/Group: Individual  SAGUIER,JULIA 10/05/2015, 10:31 AM

## 2015-10-05 NOTE — Progress Notes (Addendum)
[facility] PHYSICAL MEDICINE & REHABILITATION     PROGRESS NOTE    Subjective/Complaints: Slept well again. No new complaints. --on follow up at 0930 pt feels weak, cold, had dizziness when up with PT   ROS: Pt denies fever, rash/itching, headache, blurred or double vision, nausea, vomiting, abdominal pain, diarrhea, chest pain, shortness of breath,   dysuria, dizziness, neck or back pain, bleeding, anxiety, or depression  Objective: Vital Signs: Blood pressure 119/74, pulse 71, temperature 98.1 F (36.7 C), temperature source Oral, resp. rate 18, height 6' (1.829 m), weight 61.236 kg (135 lb), SpO2 94 %. No results found. No results for input(s): WBC, HGB, HCT, PLT in the last 72 hours. No results for input(s): NA, K, CL, GLUCOSE, BUN, CREATININE, CALCIUM in the last 72 hours.  Invalid input(s): CO CBG (last 3)   Recent Labs  10/04/15 1655 10/04/15 2218 10/05/15 0621  GLUCAP 100* 91 95    Wt Readings from Last 3 Encounters:  09/26/15 61.236 kg (135 lb)  09/26/15 63.594 kg (140 lb 3.2 oz)  08/06/15 67.268 kg (148 lb 4.8 oz)    Physical Exam:  Constitutional: He is oriented to person, place, and time. appears a little pale, no acute distress HENT:  OD with decreased conjunctival irritation---no drainage Normocephalic. Atraumatic Eyes: EOM and Conj are normal.  eye clear Neck: Normal range of motion. Neck supple. No thyromegaly present.  Cardiovascular: Irregular irregular , rate nearly 120 Respiratory: Effort normal and breath sounds normal. No respiratory distress.  GI: Soft. Bowel sounds are normal. He exhibits no distension.  Neurological: He is alert and oriented to person, place, and time. He exhibits normal muscle tone.  Follows full commands.  Good insight and awareness.  Right facial weakness. No hearing on right.  B/L UE: 4/5 proximal to distal B/L LE: 3 to 3+/5 Hip flexion, 4/5 knee extension, ankle dorsi/plantar flexion Decreased sensation to  light touch distal LE below the knees bilaterally is persistent 3+ DTR RLE and RUE. Skin: Skin is warm and dry. A few small abrasions noted Psychiatric: He has a normal mood and affect. His behavior is normal   Assessment/Plan: 1. 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 Physiatrist is providing close team supervision and 24 hour management of active medical problems listed below. Physiatrist and rehab team continue to assess barriers to discharge/monitor patient progress toward functional and medical goals.  Function:  Bathing Bathing position Bathing activity did not occur:  (declined) Position: Shower  Bathing parts Body parts bathed by patient: Right arm, Left arm, Chest, Abdomen, Front perineal area, Buttocks, Right upper leg, Left upper leg, Right lower leg, Left lower leg Body parts bathed by helper: Back  Bathing assist Assist Level: Supervision or verbal cues      Upper Body Dressing/Undressing Upper body dressing   What is the patient wearing?: Pull over shirt/dress, Button up shirt     Pull over shirt/dress - Perfomed by patient: Thread/unthread right sleeve, Thread/unthread left sleeve, Put head through opening, Pull shirt over trunk   Button up shirt - Perfomed by patient: Thread/unthread right sleeve, Thread/unthread left sleeve, Pull shirt around back, Button/unbutton shirt      Upper body assist Assist Level: Supervision or verbal cues   Set up : To obtain clothing/put away  Lower Body Dressing/Undressing Lower body dressing   What is the patient wearing?: Pants, Shoes, Socks  Pants- Performed by patient: Thread/unthread right pants leg, Thread/unthread left pants leg, Pull pants up/down Pants- Performed by helper: Thread/unthread left pants leg, Thread/unthread right pants leg, Pull pants up/down Non-skid slipper socks-  Performed by patient: Don/doff right sock, Don/doff left sock   Socks - Performed by patient: Don/doff right sock, Don/doff left sock   Shoes - Performed by patient: Don/doff right shoe, Don/doff left shoe, Fasten right, Fasten left            Lower body assist Assist for lower body dressing: Supervision or verbal cues      Toileting Toileting   Toileting steps completed by patient: Adjust clothing prior to toileting, Performs perineal hygiene, Adjust clothing after toileting Toileting steps completed by helper: Adjust clothing prior to toileting Toileting Assistive Devices: Grab bar or rail  Toileting assist Assist level: Supervision or verbal cues   Transfers Chair/bed transfer Chair/bed transfer activity did not occur: N/A Chair/bed transfer method: Ambulatory Chair/bed transfer assist level: Supervision or verbal cues Chair/bed transfer assistive device: Armrests, Medical sales representative     Max distance: 150 Assist level: Supervision or verbal cues   Wheelchair Wheelchair activity did not occur: N/A Type: Manual Max wheelchair distance: >500 Assist Level: Supervision or verbal cues  Cognition Comprehension Comprehension assist level: Understands complex 90% of the time/cues 10% of the time  Expression Expression assist level: Expresses complex 90% of the time/cues < 10% of the time  Social Interaction       Social Interaction assist level: Interacts appropriately with others with medication or extra time (anti-anxiety, antidepressant).  Problem Solving Problem solving assist level: Solves basic 90% of the time/requires cueing < 10% of the time  Memory Memory assist level: Recognizes or recalls 90% of the time/requires cueing < 10% of the time   Medical Problem List and Plan: 1. Weakness secondary to SDH after fall,was anticoagulated at time of fall  Hx right acoustic neuroma  -supervision goals  -dc home tomorrow with follow up arranged, [place] 2. DVT  Prophylaxis/Anticoagulation: SCD, ambulating daily 3. Pain Management: Tylenol as needed 4. Mood: Ativan 0.5 mg every 6 hours as needed, Wellbutrin 100 mg twice a day, 5. Neuropsych: This patient is capable of making decisions on his own behalf. 6. Skin/Wound Care: Routine skin checks. Stage 2 breakdown in gluteal cleft---foam dressing/pressure relief, nutrition/protein  -HH RN to follow up 7. Fluids/Electrolytes/Nutrition: encourage po. Good intake at present 8. History of atrial fibrillation. Chronic Coumadin discontinued secondary to subdural hematoma. Continue Cardizem 240 mg daily. Toprol, norvasc---some fluctuation it appears in readings--- will need follow up as outpt.  -pt with dizziness in therapy. HR appears to be near 120 this morning upon second auscultation  -check labs, EKG, cards consulted  -begin IVF 75cc hr 9. Hypertension. Toprol XL 50mg , will decrease norvasc to 2.5mg  daily due to lower day time BP's. 10. Diabetes mellitus with peripheral neuropathy. Hemoglobin A1c 5.5. Sugars under control at present 12. BPH. Flomax 0.4 mg daily. Voiding without issue   LOS (Days) 9 A FACE TO FACE EVALUATION WAS PERFORMED  Joven Mom T 10/05/2015 8:23 AM

## 2015-10-05 NOTE — Discharge Summary (Signed)
Discharge summary job [id number]

## 2015-10-05 NOTE — Progress Notes (Signed)
Physical Therapy Session Note  Patient Details  Name: Tommy Weeks MRN: [id number] Date of Birth: 13-Nov-1940  Today's Date: 10/05/2015 PT Individual Time: 0800-0900 PT Individual Time Calculation (min): 60 min   Short Term Goals: Week 1:  PT Short Term Goal 1 (Week 1): Patient will ambulate 150 ft with supervision.  PT Short Term Goal 2 (Week 1): Patient will transfer bed <> wheelchair with supervision.  PT Short Term Goal 3 (Week 1): Patient will maintain standing balance during functional task x 3 min with supervision.  PT Short Term Goal 4 (Week 1): Patient will perform floor transfer with min A.   Skilled Therapeutic Interventions/Progress Updates:   Patient's son present for session. Upon arrival, patient sitting edge of bed finishing breakfast and requested to toilet. Patient ambulated using RW with supervision to toilet but when ambulating to sink for hand hygiene, he unsafely kept pushing RW into objects while losing balance anteriorly requiring total A to prevent fall. Patient unable to follow commands for safe mobility and directed back to bed. Seated BP assessed 101/73. Patient elected to use wheelchair instead of ambulate but agreeable to continue with therapy. Patient propelled wheelchair about 100 ft using BUE with supervision. Performed simulated car transfer to sedan height using RW with supervision and agreeable to attempt ambulation again. Patient ambulated about 30 ft before having another episode of "dizziness/weakness" and unable to follow commands requiring total A for balance and physically directed into wheelchair. Rechecked BP 104/66. Patient agreeable to attempt 4 (6") stairs but was unable to descend safely and therapist unable to assist him down stairs and helper had to bring a chair up behind him to sit. Patient returned to bed and left supine in bed with son at bedside. Patient's son provided with handout of [employer] for strengthening and falls prevention to be  completed with supervision after discharge. RN and PA made aware of therapist's concerns regarding discharge home given decreased safety and high risk of falls with mobility this date. Discussed with patient and son need for patient to self-monitor and direct care regarding symptoms as he was unable to tell therapist when he became symptomatic despite max cues to do so and possible need for wheelchair upon discharge home for safety due to patient feeling weak and unable to ambulate/remain standing independently throughout session, both verbalized understanding and in agreement.    Therapy Documentation Precautions:  Precautions Precautions: Fall Precaution Comments: hx of R facial paralysis Restrictions Weight Bearing Restrictions: No Pain: Pain Assessment Pain Assessment: No/denies pain   See Function Navigator for Current Functional Status.   Therapy/Group: Individual Therapy  [name] 10/05/2015, 1:45 PM

## 2015-10-05 NOTE — Progress Notes (Signed)
Social Work Patient ID: Tommy Weeks, male   DOB: 06/05/1941, 74 y.o.   MRN: [id number]  Alerted by PA that pt's d/c originally set for 12/17 will be post-poned until 12/19 pending medical clearance.  Pt and family are aware. I have also alerted [facility] of change.  Marx Doig, LCSW

## 2015-10-05 NOTE — Progress Notes (Signed)
Occupational Therapy Discharge Summary  Patient Details  Name: Tommy Weeks MRN: [id number] Date of Birth: Mar 14, 1941   Patient has met 11 of 11 long term goals due to improved activity tolerance, improved balance, postural control, ability to compensate for deficits and improved awareness.  Patient to discharge at overall very close Supervision level when pt is ambulating with his RW short distances to complete his self care and basic ADLs.  Patient's care partner is independent to provide the necessary physical assistance at discharge. Pt will have a hired 24/7 caregiver.    Reasons goals not met: n/a  Recommendation:  Patient will benefit from ongoing skilled OT services in home health setting to continue to advance functional skills in the area of BADL.  Equipment: No equipment provided. Pt has a shower seat.  Reasons for discharge: treatment goals met  Patient/family agrees with progress made and goals achieved: Yes  OT Discharge Precautions/Restrictions  Precautions Precautions: Fall Precaution Comments: hx of R facial paralysis ADL ADL ADL Comments: Very close S overall with all basic self care using RW. Pt is at high risk for falls due to variable balance. Vision/Perception  Vision- History Baseline Vision/History: No visual deficits Patient Visual Report: No change from baseline Vision- Assessment Additional Comments: not tested, but pt may have slight peripheral field impairments. Pt occasionally bumps RW into objects or pushes it to close to the walls.   Cognition Overall Cognitive Status: Within Functional Limits for tasks assessed Memory: Impaired Memory Impairment: Decreased recall of new information Sensation Sensation Light Touch Impaired Details: Impaired RLE;Impaired LLE Stereognosis: Appears Intact Hot/Cold: Appears Intact Additional Comments: decreased sensation to LT left thigh/knee, impaired to absent in B ankles and feet Coordination Gross Motor  Movements are Fluid and Coordinated: No Fine Motor Movements are Fluid and Coordinated: Yes Coordination and Movement Description: No further LUE ataxia. Pt is able to open containers and fasten buttons.  Motor  Motor Motor: Abnormal postural alignment and control Motor - Discharge Observations: poor balance, generalized weakness, relies heavily on RW to support balance during ambulation Mobility    close S with RW for ADL transfers to walk in shower and toilet Trunk/Postural Assessment  Cervical Assessment Cervical Assessment: Within Functional Limits Thoracic Assessment Thoracic Assessment: Within Functional Limits Lumbar Assessment Lumbar Assessment: Within Functional Limits Postural Control Protective Responses: delayed/impaired, pt will often try to reach out of his BOS without adequate foot placement  Balance Dynamic Sitting Balance Dynamic Sitting - Balance Support: During functional activity Dynamic Sitting - Level of Assistance: 5: Stand by assistance Static Standing Balance Static Standing - Balance Support: During functional activity Static Standing - Level of Assistance: 5: Stand by assistance Dynamic Standing Balance Dynamic Standing - Balance Support: During functional activity Dynamic Standing - Level of Assistance: 3: Mod assist Dynamic Standing - Comments: mod A without UE support, close supervision if pt has one hand on RW or grab bar when standing for LB self care Extremity/Trunk Assessment RUE Assessment RUE Assessment: Exceptions to [facility] RUE Strength RUE Overall Strength: Deficits (4-/5) LUE Assessment LUE Assessment: Exceptions to [facility] LUE Strength LUE Overall Strength: Other (Comment);Deficits (4-/5)   See Function Navigator for Current Functional Status.  [place] 10/05/2015, 11:44 AM

## 2015-10-05 NOTE — Progress Notes (Signed)
Physical Therapy Session Note  Patient Details  Name: Tommy Weeks MRN: [id number] Date of Birth: 03/20/1941  Today's Date: 10/05/2015 PT Individual Time: [id number] PT Individual Time Calculation (min): 53 min   Short Term Goals: Week 1:  PT Short Term Goal 1 (Week 1): Patient will ambulate 150 ft with supervision.  PT Short Term Goal 2 (Week 1): Patient will transfer bed <> wheelchair with supervision.  PT Short Term Goal 3 (Week 1): Patient will maintain standing balance during functional task x 3 min with supervision.  PT Short Term Goal 4 (Week 1): Patient will perform floor transfer with min A.   Skilled Therapeutic Interventions/Progress Updates:   Pt received in bed connected to IV fluids; pt reporting fatigue but no reports of lightheadedness; pt willing to participate in therapy in the room.  Pt aware of his D/C being held until Monday due to medical issues. Assisted pt with donning THT prior to transferring to EOB.  Pt performed supine > sit EOB with supervision.  Provided pt with handouts of vestibular-visual exercises; seated EOB reviewed x 1 viewing and head-eye movement exercises with pt; discussed safety and progression with patient.  Reviewed new visual targeting exercise with pt in standing; cued pt to hold on to counter top and perform marching in place with use of eye-head movements for visual targeting during dynamic movement.  Pt required min A for balance and mod-max verbal cues to sequence exercise.  Pt fatigued very quickly and request to return to bed.  Transferred from sink > bed with RW and min A.  Transitioned back to supine supervision and left with all items within reach.  Will continue to address visual-vestibular exercises with pt prior to D/C.  Therapy Documentation Precautions:  Precautions Precautions: Fall Precaution Comments: hx of R facial paralysis Restrictions Weight Bearing Restrictions: No Vital Signs: Therapy Vitals Temp: 97.7 F (36.5  C) Temp Source: Oral Resp: 18 BP: 105/66 mmHg Patient Position (if appropriate): Sitting Oxygen Therapy SpO2: 100 % O2 Device: Not Delivered Pain: Pain Assessment Pain Assessment: No/denies pain   See Function Navigator for Current Functional Status.   Therapy/Group: Individual Therapy  Raylene Everts [facility] 10/05/2015, 4:43 PM

## 2015-10-05 NOTE — Progress Notes (Signed)
Occupational Therapy Session Note  Patient Details  Name: Tommy Weeks MRN: [id number] Date of Birth: 13-Nov-1940  Today's Date: 10/05/2015 OT Individual Time: 1130-1156 OT Individual Time Calculation (min): 26 min    Short Term Goals: Week 1:  OT Short Term Goal 1 (Week 1): Pt will transfer into shower stall with min A  OT Short Term Goal 2 (Week 1): Pt will self correct LOB with min A 50% OT Short Term Goal 3 (Week 1): Pt will perform 3 grooming tasks in standing with supervision.  Skilled Therapeutic Interventions/Progress Updates:    Pt seen for OT therapy session focusing on UE/ LE strengthening and ROM. Pt in supine upon arrival. Report from RN that pt has been feeling week with PA following. Checked in with PA, permission to proceed with therapy for light activities at EOB. Pt agreeable. Pt transferred to EOB with supervision. He sat EOB to complete UE strengthening/ ROM including shoulder flexion, elbow flexion/extension, and LE knee flexion/extension, and hip flexion. All exercises completed on B sides x10 reps with rest breaks provided after each muscle group set. Pt returned to supine at end of session, left with all needs in reach.   Therapy Documentation Precautions:  Precautions Precautions: Fall Precaution Comments: hx of R facial paralysis Restrictions Weight Bearing Restrictions: No Pain: Pain Assessment Pain Assessment: No/denies pain  See Function Navigator for Current Functional Status.   Therapy/Group: Individual Therapy  Lewis, Ami Mally C 10/05/2015, 7:21 AM

## 2015-10-05 NOTE — Consult Note (Signed)
CARDIOLOGY CONSULT NOTE  Patient ID: CORYE JEANNOT MRN: [id number] DOB/AGE: 1941-10-05 74 y.o.  Admit date: 09/26/2015 Referring Physician  Dr. Tessa Lerner Primary Physician:  Jani Gravel, MD Reason for Consultation  Hypertension and medication management  HPI: KRISS DIFRANCO  is a 74 y.o. male  With JAHMIL YOKLEY is a 74 y.o. 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   Presented 09/23/2015 with weakness lower extremities and to a lesser extent upper extremities. He does note 3 separate falls over the past 2 weeks hitting his head against a door jam in the bathroom. INR on admission of 2.97. CT of the brain showed a right frontal isodense subacute subdural hematoma without significant mass effect. Patient's Coumadin was reversed. Follow-up neurosurgery Dr. Sherwood Gambler advise conservative care, to discontinue coumadin and recommended antiplatelet therapy only. Latest follow-up cranial CT scan 09/25/2015 with overall appearance smaller left-sided subdural hematoma without midline shift.  It was noted that he has had low PP and due to h/o A. Fib and hypertension, I was asked for an opinion regarding the medical management.   He is doing well, but notices sudden weakness during rehab. States he is still imbalanced and weak in legs.  Past Medical History  Diagnosis Date  . Hypertension   . 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 Anxiety   . Depression   . 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 Prostate cancer ([place])   . History of urinary retention   . History of prostatitis   . Chronic atrial fibrillation ([place])   . Dysrhythmia   . Type 2 diabetes  mellitus ([facility])     NIDDM  . Complication of anesthesia     urinary retention  . Hyperlipidemia   . Neuromuscular disorder ([place])     imbalance s/p craniectomy  . Prostate cancer [facility])      Past Surgical History  Procedure Laterality Date  . Cholecystectomy  90's  . Retroperitoneal mass excision  03/31/2013  . Laparotomy N/A 03/31/2013    Procedure: Open resection of abdominal mass;  Surgeon: Stark Klein, MD;  Location: [facility];  Service: General;  Laterality: N/A;  . Cardiac catheterization  1990  . Inguinal hernia repair Bilateral 05/24/2013    Procedure: bilateral open HERNIA REPAIR INGUINAL ADULT;  Surgeon: Gayland Curry, MD;  Location: [facility];  Service: General;  Laterality: Bilateral;  . Insertion of mesh Bilateral 05/24/2013    Procedure: INSERTION OF MESH;  Surgeon: Gayland Curry, MD;  Location: [facility];  Service: General;  Laterality: Bilateral;  . Umbilical hernia repair  1990's  . Craniectomy for excision of acoustic neuroma Right 02-12-2001  . Hydrocele excision Right 11-26-2007  . Exploration face right side  11-29-2001  &  02-06-2003    post craniotomy resection acoustic neuroma--  facial paralysis  . Transthoracic echocardiogram  05-11-2007  (per PCP note)    ef 55-65%,  mild TR and MR  . Eye surgery Bilateral     cataract OU w/ IOL  . Transrectal ultrasound N/A 06/11/2015    Procedure: TRANSRECTAL ULTRASOUND AND BIOPSY;  Surgeon: Kathie Rhodes, MD;  Location: [facility];  Service: Urology;  Laterality: N/A;     No family history on file.   Social History: Social History   Social History  . Marital Status: Married    Spouse Name: N/A  . Number of Children: N/A  . Years of Education: N/A   Occupational History  . Not on file.   Social History Main Topics  . Smoking status: Never Smoker   . Smokeless tobacco: Never Used  . Alcohol Use: No  . Drug Use: No  . Sexual Activity: Not on file   Other Topics Concern  . Not on file   Social History  Narrative     Prescriptions prior to admission  Medication Sig Dispense Refill Last Dose  . acetaminophen (TYLENOL) 500 MG tablet Take 500 mg by mouth every 8 (eight) hours as needed for mild pain.   Past Week at Unknown time  . amLODipine (NORVASC) 5 MG tablet Take 5 mg by mouth every morning.    09/23/2015 at 1000  . feeding supplement, ENSURE ENLIVE, (ENSURE ENLIVE) LIQD Take 237 mLs by mouth 2 (two) times daily between meals. (Patient taking differently: Take 237 mLs by mouth daily. ) 237 mL 12 09/23/2015 at Unknown time  . fish oil-omega-3 fatty acids 1000 MG capsule Take 1 g by mouth 2 (two) times daily.    09/23/2015 at Unknown time  . LORazepam (ATIVAN) 0.5 MG tablet Take 0.5 mg by mouth 2 (two) times daily.   09/23/2015 at Unknown time  . metFORMIN (GLUCOPHAGE) 500 MG tablet Take 1,000 mg by mouth daily before supper.   09/22/2015 at 1800  . rosuvastatin (CRESTOR) 5 MG tablet Take 5 mg by mouth every Monday. Monday   09/17/2015  . [DISCONTINUED] buPROPion (WELLBUTRIN) 100 MG tablet Take 100 mg by mouth 2 (two) times daily.    09/23/2015 at 1000  . [DISCONTINUED] diltiazem (CARDIZEM CD) 240 MG 24 hr capsule Take 240 mg by mouth every morning.    09/23/2015 at 1000  . [DISCONTINUED] fenofibrate (TRICOR) 145 MG tablet Take 145 mg by mouth at bedtime.    09/22/2015 at Unknown time  . [DISCONTINUED] metoprolol succinate (TOPROL-XL) 100 MG 24 hr tablet Take 25 mg by mouth daily. Take with or immediately following a meal.   09/23/2015 at 1000  . [DISCONTINUED] tamsulosin (FLOMAX) 0.4 MG CAPS capsule Take 1 capsule (0.4 mg total) by mouth daily. (Patient taking differently: Take 0.8 mg by mouth at bedtime. ) 7 capsule 0 09/22/2015 at 2000     ROS: General: no fevers/chills/night sweats Eyes: no blurry vision, diplopia, or amaurosis ENT: no sore throat or hearing loss Resp: no cough, wheezing, or hemoptysis CV: no edema or palpitations GI: no abdominal pain, nausea, vomiting, diarrhea, or  constipation GU: no dysuria, frequency, or hematuria Skin: no rash Neuro:Gait instability present. Heme: no bleeding, DVT, or easy bruising Endo: no polydipsia or polyuria    Physical Exam: Blood pressure 105/66, pulse 92, temperature 97.7 F (36.5 C), temperature source Oral, resp. rate 18, height 6' (1.829 m), weight 61.236 kg (135 lb), SpO2 100 %.   BP 105/66 mmHg  Pulse 92  Temp(Src) 97.7 F (36.5 C) (Oral)  Resp 18  Ht 6' (1.829 m)  Wt 61.236 kg (135 lb)  BMI 18.31 kg/m2  SpO2 100% General appearance: alert, appears stated age and no distress Lungs: clear to auscultation bilaterally Chest wall: no tenderness Heart: no S3 or S4, no rub and no murmur Abdomen: soft, non-tender; bowel sounds normal; no masses,  no organomegaly Extremities: extremities normal, atraumatic, no cyanosis or edema Pulses: 2+ and symmetric Neurologic: Coordination: not checked. Gait not tested  Labs:   Lab Results  Component Value Date   WBC 4.3 10/05/2015   HGB 11.0* 10/05/2015   HCT 35.0* 10/05/2015   MCV 84.3 10/05/2015   PLT 312 10/05/2015    Recent Labs Lab 10/05/15 1100  NA 138  K 3.7  CL 96*  CO2 32  BUN 17  CREATININE 1.21  CALCIUM 9.1  GLUCOSE 108*    HEMOGLOBIN A1C Lab Results  Component Value Date   HGBA1C 5.5 09/23/2015   MPG 111 09/23/2015     Recent Labs  07/04/15 1331  TSH 1.654    EKG 10/05/15:  Atrial fibrillation with rapid ventricular response at the rate of105-115 bpm, normal axis. Nonspecific ST-T wave abnormalities  Echo: 07/05/2015: Procedure narrative: Transthoracic echocardiography. Image quality was poor. The study was technically difficult, as a result of poor acoustic windows and poor sound wave transmission. - Left ventricle: The cavity size was normal. Wall thickness was increased in a pattern of mild LVH. Systolic function was normal. The estimated ejection fraction was in the range of 60% to 65%. The study is not technically  sufficient to allow evaluation of LV diastolic function. - Left atrium: The atrium was normal in size.  Scheduled Meds: . buPROPion  100 mg Oral BID  . diltiazem  240 mg Oral q morning - 10a  . feeding supplement  1 Container Oral Q24H  . feeding supplement (GLUCERNA SHAKE)  237 mL Oral BID BM  . insulin aspart  0-15 Units Subcutaneous TID WC  . metoprolol succinate  50 mg Oral Q breakfast  . tamsulosin  0.4 mg Oral Daily   Continuous Infusions: . sodium chloride     PRN Meds:.acetaminophen, LORazepam, ondansetron **OR** ondansetron (ZOFRAN) IV, sorbitol  ASSESSMENT AND PLAN:  1. Chronic A. Fibrillation CHA2DS2-VASc Score is 3 with yearly risk of stroke of 3.2 % 2. Hypertension 3. DM-2 controlled  Rec: Discontinue Amlodipine and  Cardizem CD continue Metoprolol present dose. Add digoxin for rate control.  Anticoagulation is contraindicated for now and will reassess in 3-4 months depending upon his balance if we could start coumadin safely.  I will see him in 10 days for rate control in our office. I have already sent Digoxin Rx through Epic and discontinue Diltiazem and amlodipine on D/C orders. He will need event monitor from our office to exclude sick sinus syndrome and heart block due to symptoms of sudden onset weakness during rehab. Telemetry while here is also appropriate if possible but not absolute.  Call Dr. Doylene Canard, [place] Aly if you need assist as I am on vacation starting tomorrow for a week. (I am available to talk tomorrow on my cell)  Adrian Prows, MD 10/05/2015, 6:30 PM [facility]. [place] Pager: [phone] Office: [phone] If no answer Cell [phone]

## 2015-10-06 ENCOUNTER — Inpatient Hospital Stay (HOSPITAL_COMMUNITY): Payer: Medicare Other | Admitting: Physical Therapy

## 2015-10-06 LAB — GLUCOSE, CAPILLARY
GLUCOSE-CAPILLARY: 112 mg/dL — AB (ref 65–99)
GLUCOSE-CAPILLARY: 118 mg/dL — AB (ref 65–99)
Glucose-Capillary: 102 mg/dL — ABNORMAL HIGH (ref 65–99)
Glucose-Capillary: 123 mg/dL — ABNORMAL HIGH (ref 65–99)

## 2015-10-06 NOTE — Progress Notes (Signed)
Physical Therapy Session Note  Patient Details  Name: Tommy Weeks MRN: [id number] Date of Birth: Feb 24, 1941  Today's Date: 10/06/2015 PT Individual Time: 1115-1200 PT Individual Time Calculation (min): 45 min   Short Term Goals: Week 1:  PT Short Term Goal 1 (Week 1): Patient will ambulate 150 ft with supervision.  PT Short Term Goal 2 (Week 1): Patient will transfer bed <> wheelchair with supervision.  PT Short Term Goal 3 (Week 1): Patient will maintain standing balance during functional task x 3 min with supervision.  PT Short Term Goal 4 (Week 1): Patient will perform floor transfer with min A.   Skilled Therapeutic Interventions/Progress Updates:  Pt was seen bedside in the am sitting up in w/c. Pt performed all sit to stand transfers with rolling walker and S. Pt ambulated 175 and 200 feet with rolling walker and S to min guard and verbal cues. In gym treatment focused on NMR utilizing cone taps and alternating cone taps, 3 sets x 10 reps each. Pt had one LOB balance while ambulating requiring min A to correct. Pt returned to room following treatment and left sitting up in w/c with call bell within reach.     Therapy Documentation Precautions:  Precautions Precautions: Fall Precaution Comments: hx of R facial paralysis Restrictions Weight Bearing Restrictions: No General:   Pain: No c/o pain.   See Function Navigator for Current Functional Status.   Therapy/Group: Individual Therapy  Sriharsha, [place] 10/06/2015, 12:59 PM

## 2015-10-06 NOTE — Progress Notes (Signed)
[facility] PHYSICAL MEDICINE & REHABILITATION     PROGRESS NOTE    Subjective/Complaints: Feeling MUCH better today   ROS: Pt denies fever, rash/itching, headache, blurred or double vision, nausea, vomiting, abdominal pain, diarrhea, chest pain, shortness of breath,   dysuria, dizziness, neck or back pain, bleeding, anxiety, or depression  Objective: Vital Signs: Blood pressure 115/67, pulse 98, temperature 98.8 F (37.1 C), temperature source Oral, resp. rate 18, height 6' (1.829 m), weight 61.236 kg (135 lb), SpO2 99 %. No results found.  Recent Labs  10/05/15 1100  WBC 4.3  HGB 11.0*  HCT 35.0*  PLT 312    Recent Labs  10/05/15 1100  NA 138  K 3.7  CL 96*  GLUCOSE 108*  BUN 17  CREATININE 1.21  CALCIUM 9.1   CBG (last 3)   Recent Labs  10/05/15 1620 10/05/15 2149 10/06/15 0730  GLUCAP 114* 102* 112*    Wt Readings from Last 3 Encounters:  09/26/15 61.236 kg (135 lb)  09/26/15 63.594 kg (140 lb 3.2 oz)  08/06/15 67.268 kg (148 lb 4.8 oz)    Physical Exam:  Constitutional: He is oriented to person, place, and time. appears a little pale, no acute distress HENT:  OD with decreased conjunctival irritation---no drainage Normocephalic. Atraumatic Eyes: EOM and Conj are normal.  eye clear Neck: Normal range of motion. Neck supple. No thyromegaly present.  Cardiovascular: Irregular irregular , rate nearly 120 Respiratory: Effort normal and breath sounds normal. No respiratory distress.  GI: Soft. Bowel sounds are normal. He exhibits no distension.  Neurological: He is alert and oriented to person, place, and time. He exhibits normal muscle tone.  Follows full commands.  Good insight and awareness.  Right facial weakness. No hearing on right.  B/L UE: 4/5 proximal to distal B/L LE: 3 to 3+/5 Hip flexion, 4/5 knee extension, ankle dorsi/plantar flexion Decreased sensation to light touch distal LE below the knees bilaterally is persistent 3+ DTR  RLE and RUE. Skin: Skin is warm and dry. A few small abrasions noted Psychiatric: He has a normal mood and affect. His behavior is normal   Assessment/Plan: 1. 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 Physiatrist is providing close team supervision and 24 hour management of active medical problems listed below. Physiatrist and rehab team continue to assess barriers to discharge/monitor patient progress toward functional and medical goals.  Function:  Bathing Bathing position Bathing activity did not occur:  (declined) Position: Shower  Bathing parts Body parts bathed by patient: Right arm, Left arm, Chest, Abdomen, Front perineal area, Buttocks, Right upper leg, Left upper leg, Right lower leg, Left lower leg Body parts bathed by helper: Back  Bathing assist Assist Level: Supervision or verbal cues      Upper Body Dressing/Undressing Upper body dressing   What is the patient wearing?: Pull over shirt/dress, Button up shirt     Pull over shirt/dress - Perfomed by patient: Thread/unthread right sleeve, Thread/unthread left sleeve, Put head through opening, Pull shirt over trunk   Button up shirt - Perfomed by patient: Thread/unthread right sleeve, Thread/unthread left sleeve, Pull shirt around back, Button/unbutton shirt      Upper body assist Assist Level: Supervision or verbal cues   Set up : To obtain clothing/put away  Lower Body Dressing/Undressing Lower body dressing   What is the patient wearing?: Pants, Socks, Non-skid slipper socks  Pants- Performed by patient: Thread/unthread right pants leg, Thread/unthread left pants leg, Pull pants up/down Pants- Performed by helper: Thread/unthread left pants leg, Thread/unthread right pants leg, Pull pants up/down Non-skid slipper socks- Performed by patient: Don/doff right sock, Don/doff left sock    Socks - Performed by patient: Don/doff right sock, Don/doff left sock   Shoes - Performed by patient: Don/doff right shoe, Don/doff left shoe, Fasten right, Fasten left            Lower body assist Assist for lower body dressing: Supervision or verbal cues      Toileting Toileting   Toileting steps completed by patient: Adjust clothing prior to toileting, Performs perineal hygiene, Adjust clothing after toileting Toileting steps completed by helper: Adjust clothing prior to toileting Toileting Assistive Devices: Grab bar or rail  Toileting assist Assist level: Supervision or verbal cues   Transfers Chair/bed transfer Chair/bed transfer activity did not occur: N/A Chair/bed transfer method: Anterior/posterior Chair/bed transfer assist level: Touching or steadying assistance (Pt > 75%) Chair/bed transfer assistive device: Armrests, Medical sales representative     Max distance: 30 Assist level: Touching or steadying assistance (Pt > 75%)   Wheelchair Wheelchair activity did not occur: N/A Type: Manual Max wheelchair distance: 100 Assist Level: Supervision or verbal cues  Cognition Comprehension Comprehension assist level: Understands basic 90% of the time/cues < 10% of the time  Expression Expression assist level: Expresses basic 90% of the time/requires cueing < 10% of the time.  Social Interaction       Social Interaction assist level: Interacts appropriately with others with medication or extra time (anti-anxiety, antidepressant).  Problem Solving Problem solving assist level: Solves basic 90% of the time/requires cueing < 10% of the time  Memory Memory assist level: Recognizes or recalls 90% of the time/requires cueing < 10% of the time   Medical Problem List and Plan: 1. Weakness secondary to SDH after fall,was anticoagulated at time of fall  Hx right acoustic neuroma  -supervision goals  -will observe over weekend for activity tolerance. Aiming toward dc  monday 2. DVT Prophylaxis/Anticoagulation: SCD, ambulating daily 3. Pain Management: Tylenol as needed 4. Mood: Ativan 0.5 mg every 6 hours as needed, Wellbutrin 100 mg twice a day, 5. Neuropsych: This patient is capable of making decisions on his own behalf. 6. Skin/Wound Care: Routine skin checks. Stage 2 breakdown in gluteal cleft---foam dressing/pressure relief, nutrition/protein  -HH RN to follow up 7. Fluids/Electrolytes/Nutrition: encourage po. Good intake at present 8. History of atrial fibrillation. Chronic Coumadin discontinued secondary to subdural hematoma. Continue Cardizem 240 mg daily. Toprol, norvasc---some fluctuation it appears in readings--- will need follow up as outpt.  -digoxin added, cards consult appreciated. HR improved this AM  -dc ivf  -event monitor 9. Hypertension. Toprol XL 50mg , will decrease norvasc to 2.5mg  daily due to lower day time BP's. 10. Diabetes mellitus with peripheral neuropathy. Hemoglobin A1c 5.5. Sugars under control at present 12. BPH. Flomax 0.4 mg daily. Voiding without issue   LOS (Days) 10 A FACE TO FACE EVALUATION WAS PERFORMED  Laquetta Racey T 10/06/2015 8:04 AM

## 2015-10-07 ENCOUNTER — Inpatient Hospital Stay (HOSPITAL_COMMUNITY): Payer: Medicare Other | Admitting: Occupational Therapy

## 2015-10-07 ENCOUNTER — Inpatient Hospital Stay (HOSPITAL_COMMUNITY): Payer: Medicare Other | Admitting: Physical Therapy

## 2015-10-07 LAB — BASIC METABOLIC PANEL
ANION GAP: 6 (ref 5–15)
BUN: 11 mg/dL (ref 6–20)
CALCIUM: 8.7 mg/dL — AB (ref 8.9–10.3)
CO2: 33 mmol/L — AB (ref 22–32)
CREATININE: 0.85 mg/dL (ref 0.61–1.24)
Chloride: 98 mmol/L — ABNORMAL LOW (ref 101–111)
GFR calc Af Amer: >60 mL/min (ref >60)
GFR calc non Af Amer: >60 mL/min (ref >60)
GLUCOSE: 107 mg/dL — AB (ref 65–99)
Potassium: 3.6 mmol/L (ref 3.5–5.1)
Sodium: 137 mmol/L (ref 135–145)

## 2015-10-07 LAB — GLUCOSE, CAPILLARY
GLUCOSE-CAPILLARY: 121 mg/dL — AB (ref 65–99)
GLUCOSE-CAPILLARY: 110 mg/dL — AB (ref 65–99)
Glucose-Capillary: 104 mg/dL — ABNORMAL HIGH (ref 65–99)
Glucose-Capillary: 103 mg/dL — ABNORMAL HIGH (ref 65–99)

## 2015-10-07 MED ORDER — MEGESTROL ACETATE 400 MG/10ML PO SUSP
400.0000 mg | Freq: Every day | ORAL | Status: DC
  Administered 2015-10-07 – 2015-10-08 (×2): 400 mg via ORAL
  Filled 2015-10-07 (×3): qty 10

## 2015-10-07 NOTE — Progress Notes (Signed)
[facility] PHYSICAL MEDICINE & REHABILITATION     PROGRESS NOTE    Subjective/Complaints: No problems overnight. BP recorded low (accuracy?). Appetite could be better.   ROS: Pt denies fever, rash/itching, headache, blurred or double vision, nausea, vomiting, abdominal pain, diarrhea, chest pain, shortness of breath,   dysuria, dizziness, neck or back pain, bleeding, anxiety, or depression  Objective: Vital Signs: Blood pressure 116/78, pulse 112, temperature 99.2 F (37.3 C), temperature source Oral, resp. rate 18, height 6' (1.829 m), weight 61.236 kg (135 lb), SpO2 96 %. No results found.  Recent Labs  10/05/15 1100  WBC 4.3  HGB 11.0*  HCT 35.0*  PLT 312    Recent Labs  10/05/15 1100 10/07/15 0525  NA 138 137  K 3.7 3.6  CL 96* 98*  GLUCOSE 108* 107*  BUN 17 11  CREATININE 1.21 0.85  CALCIUM 9.1 8.7*   CBG (last 3)   Recent Labs  10/06/15 1626 10/06/15 2058 10/07/15 0631  GLUCAP 123* 118* 104*    Wt Readings from Last 3 Encounters:  09/26/15 61.236 kg (135 lb)  09/26/15 63.594 kg (140 lb 3.2 oz)  08/06/15 67.268 kg (148 lb 4.8 oz)    Physical Exam:  Constitutional: He is oriented to person, place, and time. appears a little pale, no acute distress HENT:  OD with decreased conjunctival irritation---no drainage Normocephalic. Atraumatic Eyes: EOM and Conj are normal.  eye clear Neck: Normal range of motion. Neck supple. No thyromegaly present.  Cardiovascular: Irregular irregular , rate around 80-85 Respiratory: Effort normal and breath sounds normal. No respiratory distress.  GI: Soft. Bowel sounds are normal. He exhibits no distension.  Neurological: He is alert and oriented to person, place, and time. He exhibits normal muscle tone.  Follows full commands.  Good insight and awareness.  Right facial weakness. No hearing on right.  B/L UE: 4/5 proximal to distal B/L LE: 3 to 3+/5 Hip flexion, 4/5 knee extension, ankle dorsi/plantar  flexion Decreased sensation to light touch distal LE below the knees bilaterally is persistent 3+ DTR RLE and RUE. Skin: Skin is warm and dry. A few small abrasions noted Psychiatric: He has a normal mood and affect. His behavior is normal   Assessment/Plan: 1. 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 Physiatrist is providing close team supervision and 24 hour management of active medical problems listed below. Physiatrist and rehab team continue to assess barriers to discharge/monitor patient progress toward functional and medical goals.  Function:  Bathing Bathing position Bathing activity did not occur:  (declined) Position: Shower  Bathing parts Body parts bathed by patient: Right arm, Left arm, Chest, Abdomen, Front perineal area, Buttocks, Right upper leg, Left upper leg, Right lower leg, Left lower leg Body parts bathed by helper: Back  Bathing assist Assist Level: Supervision or verbal cues      Upper Body Dressing/Undressing Upper body dressing   What is the patient wearing?: Pull over shirt/dress, Button up shirt     Pull over shirt/dress - Perfomed by patient: Thread/unthread right sleeve, Thread/unthread left sleeve, Put head through opening, Pull shirt over trunk   Button up shirt - Perfomed by patient: Thread/unthread right sleeve, Thread/unthread left sleeve, Pull shirt around back, Button/unbutton shirt      Upper body assist Assist Level: Supervision or verbal cues   Set up : To obtain clothing/put away  Lower Body Dressing/Undressing Lower body  dressing   What is the patient wearing?: Pants, Socks, Non-skid slipper socks     Pants- Performed by patient: Thread/unthread right pants leg, Thread/unthread left pants leg, Pull pants up/down Pants- Performed by helper: Thread/unthread left pants leg, Thread/unthread right pants leg,  Pull pants up/down Non-skid slipper socks- Performed by patient: Don/doff right sock, Don/doff left sock   Socks - Performed by patient: Don/doff right sock, Don/doff left sock   Shoes - Performed by patient: Don/doff right shoe, Don/doff left shoe, Fasten right, Fasten left            Lower body assist Assist for lower body dressing: Supervision or verbal cues      Toileting Toileting   Toileting steps completed by patient: Adjust clothing prior to toileting, Performs perineal hygiene, Adjust clothing after toileting Toileting steps completed by helper: Adjust clothing prior to toileting Toileting Assistive Devices: Grab bar or rail  Toileting assist Assist level: Supervision or verbal cues   Transfers Chair/bed transfer Chair/bed transfer activity did not occur: N/A Chair/bed transfer method: Anterior/posterior Chair/bed transfer assist level: Touching or steadying assistance (Pt > 75%) Chair/bed transfer assistive device: Armrests, Medical sales representative     Max distance: 200 Assist level: Touching or steadying assistance (Pt > 75%)   Wheelchair Wheelchair activity did not occur: N/A Type: Manual Max wheelchair distance: 100 Assist Level: Supervision or verbal cues  Cognition Comprehension Comprehension assist level: Understands basic 90% of the time/cues < 10% of the time  Expression Expression assist level: Expresses complex 90% of the time/cues < 10% of the time  Social Interaction       Social Interaction assist level: Interacts appropriately with others with medication or extra time (anti-anxiety, antidepressant).  Problem Solving Problem solving assist level: Solves basic problems with no assist  Memory Memory assist level: Recognizes or recalls 90% of the time/requires cueing < 10% of the time   Medical Problem List and Plan: 1. Weakness secondary to SDH after fall,was anticoagulated at time of fall  Hx right acoustic neuroma  -supervision  goals  -will observe over weekend for activity tolerance. Aiming toward dc monday 2. DVT Prophylaxis/Anticoagulation: SCD, ambulating daily 3. Pain Management: Tylenol as needed 4. Mood: Ativan 0.5 mg every 6 hours as needed, Wellbutrin 100 mg twice a day, 5. Neuropsych: This patient is capable of making decisions on his own behalf. 6. Skin/Wound Care: Routine skin checks. Stage 2 breakdown in gluteal cleft---foam dressing/pressure relief, nutrition/protein  -HH RN to follow up 7. Fluids/Electrolytes/Nutrition: I personally reviewed the patient's labs today.   -add megace per pt/family request---can dc on megace if he finds it helpful 8. History of atrial fibrillation. Chronic Coumadin discontinued secondary to subdural hematoma. Continue Cardizem 240 mg daily. Toprol, norvasc---some fluctuation it appears in readings--- will need follow up as outpt.  -digoxin added, cards consult appreciated. HR improved    -off ivf.   -event monitor---check with cards office tomorrow regarding plan for this 9. Hypertension.  toprol xl, trying avoid hypotension.  Need to check bp manually 10. Diabetes mellitus with peripheral neuropathy. Hemoglobin A1c 5.5. Sugars under control at present 12. BPH. Flomax 0.4 mg daily. Voiding without issue   LOS (Days) 11 A FACE TO FACE EVALUATION WAS PERFORMED  Tommy Weeks 10/07/2015 8:05 AM

## 2015-10-07 NOTE — Progress Notes (Signed)
Occupational Therapy Session Note  Patient Details  Name: Tommy Weeks MRN: [id number] Date of Birth: 01/17/1941  Today's Date: 10/07/2015 OT Individual Time:  -0800-0900     (60 min)      Short Term Goals: Week 1:  OT Short Term Goal 1 (Week 1): Pt will transfer into shower stall with min A  OT Short Term Goal 2 (Week 1): Pt will self correct LOB with min A 50% OT Short Term Goal 3 (Week 1): Pt will perform 3 grooming tasks in standing with supervision. :       Skilled Therapeutic Interventions/Progress Updates:    Engaged in functional transfers to toilet and shower, mobility, balance and endurance.  Pt ambulated to retrieve clothes with SBA using RW.  Transferred to toilet and then to shower.  Bathed self in sitting with distant supervision and SBA with standing.  He needed cues for safety when leaving shower due to wet floor and for not hitting things on right side.  When asked about inattention to right, pt stated he did not know why he did it.  No lateral field cuts but Right lower inferior (60 degrees). Pt. Had one episode of explosive bowel but made it to toilet in time.   Pt dressed with SBA.  He needed assist picking up items off floor.  Pt fatigues during session and needed 2 rest breaks.  Left in bed with all needs in reach.    Therapy Documentation Precautions:  Precautions Precautions: Fall Precaution Comments: hx of R facial paralysis Restrictions Weight Bearing Restrictions: No General:   Vital Signs: Therapy Vitals Temp: 99.2 F (37.3 C) Temp Source: Oral Pulse Rate: (!) 112 Resp: 18 BP: 116/78 mmHg (MD aware) Patient Position (if appropriate): Lying Oxygen Therapy SpO2: 96 % O2 Device: Not Delivered Pain:  none   ADL: ADL ADL Comments: Very close S overall with all basic self care using RW. Pt is at high risk for falls due to variable balance.       See Function Navigator for Current Functional Status.   Therapy/Group: Individual  Therapy  Lisa Roca 10/07/2015, 7:53 AM

## 2015-10-07 NOTE — Progress Notes (Signed)
Physical Therapy Discharge Summary  Patient Details  Name: Tommy Weeks MRN: [id number] Date of Birth: June 18, 1941  Today's Date: 10/07/2015 PT Individual Time: 1300-1400 PT Individual Time Calculation (min): 60 min    Patient has met 9 of 10 long term goals due to improved activity tolerance, improved balance, improved postural control, ability to compensate for deficits, improved attention, improved awareness and improved coordination.  Patient to discharge at an ambulatory level Supervision.   Patient's care partner is independent to provide the necessary physical and cognitive assistance at discharge.  Reasons goals not met: Floor transfer goal not met due to low BP issues and fatigue.   Recommendation:  Patient will benefit from ongoing skilled PT services in home health setting to continue to advance safe functional mobility, address ongoing impairments in standing balance, strength, activity tolerance, vestibular rehab, and minimize fall risk.  Equipment: manual wheelchair and wheelchair cushion  Reasons for discharge: treatment goals met and discharge from hospital  Patient/family agrees with progress made and goals achieved: Yes  Skilled Therapeutic Intervention Session focused on functional mobility and balance in preparation for discharge home with 24/7 supervision due to falls risk. Patient requires supervision using RW for community distance gait, gait on uneven surfaces, functional transfers, simulated car transfer to sedan height, stairs using R rail to simulate home entry, and standing balance. Patient continues to require cues for safe RW management and hand placement with stand > sit transfers and sequencing step-to pattern on stairs. Patient instructed in 5TSS with UE support = 24 sec improved from 33 sec on evaluation. Patient demonstrated improvement on Berg Balance Scale with score of 22/56 improved from 13/56 on evaluation. Patient with no episodes of lightheadedness  or dizziness and no LOB this date. Reviewed HEP, patient verbalized understanding. Patient left supine in bed with all needs within reach and no further questions/concerns regarding discharge.   PT Discharge Precautions/Restrictions Precautions Precautions: Fall Precaution Comments: hx of R facial paralysis Restrictions Weight Bearing Restrictions: No Pain Pain Assessment Pain Assessment: No/denies pain Vision/Perception   Defer to OT discharge summary  Cognition Overall Cognitive Status: Within Functional Limits for tasks assessed Arousal/Alertness: Awake/alert Orientation Level: Oriented to person;Oriented to place;Oriented to time;Oriented to situation;Oriented X4 Attention: Selective Selective Attention: Appears intact Memory: Impaired Memory Impairment: Decreased recall of new information Awareness: Appears intact Problem Solving: Appears intact Executive Function: Self Correcting Self Correcting: Impaired Self Correcting Impairment: Verbal complex Safety/Judgment: Appears intact [employer] Scales of Cognitive Functioning: Purposeful/appropriate Sensation Sensation Light Touch Impaired Details: Impaired RLE;Impaired LLE Stereognosis: Appears Intact Hot/Cold: Appears Intact Additional Comments: decreased sensation to LT left thigh/knee, impaired to absent in B ankles and feet Coordination Gross Motor Movements are Fluid and Coordinated: No Fine Motor Movements are Fluid and Coordinated: Yes Coordination and Movement Description: generalized weakness, decreased coordination LLE Motor  Motor Motor: Abnormal postural alignment and control Motor - Discharge Observations: poor balance, generalized weakness, relies heavily on RW for balance during ambulation  Mobility Bed Mobility Bed Mobility: Rolling Right;Rolling Left;Sit to Supine;Supine to Sit Rolling Right: 6: Modified independent (Device/Increase time) Rolling Left: 6: Modified independent (Device/Increase  time) Supine to Sit: 6: Modified independent (Device/Increase time) Sit to Supine: 6: Modified independent (Device/Increase time) Transfers Transfers: Yes Sit to Stand: 5: Supervision;With upper extremity assist;With armrests Stand to Sit: 5: Supervision;With upper extremity assist;With armrests Stand to Sit Details (indicate cue type and reason): Verbal cues for precautions/safety Locomotion  Ambulation Ambulation: Yes Ambulation/Gait Assistance: 5: Supervision Ambulation Distance (Feet): 200 Feet  Assistive device: Rolling walker Gait Gait: Yes Gait Pattern: Impaired Gait Pattern: Step-through pattern;Decreased stride length;Trunk flexed Gait velocity: 10 MWT = 0.59 m/s Stairs / Additional Locomotion Stairs: Yes Stairs Assistance: 5: Supervision Stair Management Technique: One rail Right;Sideways;Step to pattern Number of Stairs: 12 Height of Stairs: 3 (8 3", 4 6" steps) Wheelchair Mobility Wheelchair Mobility: No  Trunk/Postural Assessment  Cervical Assessment Cervical Assessment: Within Functional Limits Thoracic Assessment Thoracic Assessment: Within Functional Limits Lumbar Assessment Lumbar Assessment: Within Functional Limits Postural Control Postural Control: Deficits on evaluation Protective Responses: delayed/impaired balance reactions  Balance Balance Balance Assessed: Yes Standardized Balance Assessment Standardized Balance Assessment: Berg Balance Test Berg Balance Test Sit to Stand: Able to stand  independently using hands Standing Unsupported: Unable to stand 30 seconds unassisted Sitting with Back Unsupported but Feet Supported on Floor or Stool: Able to sit safely and securely 2 minutes Stand to Sit: Controls descent by using hands Transfers: Able to transfer with verbal cueing and /or supervision Standing Unsupported with Eyes Closed: Able to stand 10 seconds with supervision Standing Ubsupported with Feet Together: Able to place feet together  independently and stand for 1 minute with supervision From Standing, Reach Forward with Outstretched Arm: Reaches forward but needs supervision From Standing Position, Pick up Object from Floor: Unable to try/needs assist to keep balance From Standing Position, Turn to Look Behind Over each Shoulder: Needs supervision when turning Turn 360 Degrees: Needs close supervision or verbal cueing Standing Unsupported, Alternately Place Feet on Step/Stool: Needs assistance to keep from falling or unable to try Standing Unsupported, One Foot in Front: Loses balance while stepping or standing Standing on One Leg: Tries to lift leg/unable to hold 3 seconds but remains standing independently Total Score: 22 Static Standing Balance Static Standing - Balance Support: During functional activity;Bilateral upper extremity supported Static Standing - Level of Assistance: 5: Stand by assistance Dynamic Standing Balance Dynamic Standing - Balance Support: During functional activity;Bilateral upper extremity supported Dynamic Standing - Level of Assistance: 5: Stand by assistance Extremity Assessment  RUE Assessment RUE Assessment: Exceptions to [facility] RUE Strength RUE Overall Strength: Deficits (4-/5) LUE Assessment LUE Assessment: Exceptions to [facility] LUE Strength LUE Overall Strength: Other (Comment);Deficits (4-/5) RLE Assessment RLE Assessment: Within Functional Limits LLE Assessment LLE Assessment: Exceptions to [facility] LLE Strength LLE Overall Strength: Deficits LLE Overall Strength Comments: grossly 3+/5 except 4/5 knee flexion   See Function Navigator for Current Functional Status.  Carney Living A 10/07/2015, 1:28 PM

## 2015-10-07 NOTE — Progress Notes (Signed)
Nursing Note: Orthostatic vitals done .Lying 125/78 p-91 Sitting 93/53 Standing 75/51 P-51. Pt was asymptomaticwbb.

## 2015-10-08 LAB — GLUCOSE, CAPILLARY
GLUCOSE-CAPILLARY: 86 mg/dL (ref 65–99)
Glucose-Capillary: 91 mg/dL (ref 65–99)

## 2015-10-08 NOTE — Discharge Instructions (Signed)
Inpatient Rehab Discharge Instructions  Tommy Weeks Discharge date and time: No discharge date for patient encounter.   Activities/Precautions/ Functional Status: Activity: activity as tolerated Diet: diabetic diet Wound Care: none needed Functional status:  ___ No restrictions     ___ Walk up steps independently ___ 24/7 supervision/assistance   ___ Walk up steps with assistance ___ Intermittent supervision/assistance  ___ Bathe/dress independently ___ Walk with walker     ___ Bathe/dress with assistance ___ Walk Independently    ___ Shower independently _x__ Walk with assistance    ___ Shower with assistance ___ No alcohol     ___ Return to work/school ________   COMMUNITY REFERRALS UPON DISCHARGE:    Home Health:   PT       RN                        Agency:  [facility] Phone: [phone]   DME:  Wheelchair and cushion via [place] @ [phone]    Special Instructions:  Hold Coumadin secondary to SDH  Follow-up with Dr. Einar Gip for event monitor from the office.  My questions have been answered and I understand these instructions. I will adhere to these goals and the provided educational materials after my discharge from the hospital.  Patient/Caregiver Signature _______________________________ Date __________  Clinician Signature _______________________________________ Date __________  Please bring this form and your medication list with you to all your follow-up doctor's appointments.

## 2015-10-08 NOTE — Progress Notes (Signed)
[place] PHYSICAL MEDICINE & REHABILITATION     PROGRESS NOTE    Subjective/Complaints: No problems overnight. BP recorded low (accuracy?). Appetite could be better.   ROS: Pt denies fever, rash/itching, headache, blurred or double vision, nausea, vomiting, abdominal pain, diarrhea, chest pain, shortness of breath,   dysuria, dizziness, neck or back pain, bleeding, anxiety, or depression  Objective: Vital Signs: Blood pressure 120/60, pulse 96, temperature 99.1 F (37.3 C), temperature source Oral, resp. rate 20, height 6' (1.829 m), weight 61.236 kg (135 lb), SpO2 98 %. No results found.  Recent Labs  10/05/15 1100  WBC 4.3  HGB 11.0*  HCT 35.0*  PLT 312    Recent Labs  10/05/15 1100 10/07/15 0525  NA 138 137  K 3.7 3.6  CL 96* 98*  GLUCOSE 108* 107*  BUN 17 11  CREATININE 1.21 0.85  CALCIUM 9.1 8.7*   CBG (last 3)   Recent Labs  10/07/15 1644 10/07/15 2141 10/08/15 0710  GLUCAP 121* 110* 86    Wt Readings from Last 3 Encounters:  09/26/15 61.236 kg (135 lb)  09/26/15 63.594 kg (140 lb 3.2 oz)  08/06/15 67.268 kg (148 lb 4.8 oz)    Physical Exam:  Constitutional: He is oriented to person, place, and time. appears a little pale, no acute distress HENT:  OD with decreased conjunctival irritation---no drainage Normocephalic. Atraumatic Eyes: EOM and Conj are normal.  eye clear Neck: Normal range of motion. Neck supple. No thyromegaly present.  Cardiovascular: Irregular irregular , rate around 80-85 Respiratory: Effort normal and breath sounds normal. No respiratory distress.  GI: Soft. Bowel sounds are normal. He exhibits no distension.  Neurological: He is alert and oriented to person, place, and time. He exhibits normal muscle tone.  Follows full commands.  Good insight and awareness.  Right facial weakness. No hearing on right.  B/L UE: 4/5 proximal to distal B/L LE: 3 to 3+/5 Hip flexion, 4/5 knee extension, ankle dorsi/plantar  flexion Decreased sensation to light touch distal LE below the knees bilaterally is persistent 3+ DTR RLE and RUE. Skin: Skin is warm and dry. A few small abrasions noted Psychiatric: He has a normal mood and affect. His behavior is normal   Assessment/Plan: 1. 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 Physiatrist is providing close team supervision and 24 hour management of active medical problems listed below. Physiatrist and rehab team continue to assess barriers to discharge/monitor patient progress toward functional and medical goals.  Function:  Bathing Bathing position Bathing activity did not occur:  (declined) Position: Shower  Bathing parts Body parts bathed by patient: Right arm, Left arm, Chest, Abdomen, Front perineal area, Buttocks, Right upper leg, Left upper leg, Right lower leg, Left lower leg Body parts bathed by helper: Back  Bathing assist Assist Level: Supervision or verbal cues      Upper Body Dressing/Undressing Upper body dressing   What is the patient wearing?: Pull over shirt/dress, Button up shirt     Pull over shirt/dress - Perfomed by patient: Thread/unthread right sleeve, Thread/unthread left sleeve, Put head through opening, Pull shirt over trunk   Button up shirt - Perfomed by patient: Thread/unthread right sleeve, Thread/unthread left sleeve, Pull shirt around back, Button/unbutton shirt      Upper body assist Assist Level: Supervision or verbal cues   Set up : To obtain clothing/put away  Lower Body Dressing/Undressing Lower body  dressing   What is the patient wearing?: Pants, Socks, Non-skid slipper socks, Shoes     Pants- Performed by patient: Thread/unthread right pants leg, Thread/unthread left pants leg, Pull pants up/down Pants- Performed by helper: Thread/unthread left pants leg, Thread/unthread right pants  leg, Pull pants up/down Non-skid slipper socks- Performed by patient: Don/doff right sock, Don/doff left sock   Socks - Performed by patient: Don/doff right sock, Don/doff left sock   Shoes - Performed by patient: Don/doff right shoe, Don/doff left shoe, Fasten right, Fasten left            Lower body assist Assist for lower body dressing: Supervision or verbal cues      Toileting Toileting   Toileting steps completed by patient: Adjust clothing prior to toileting, Performs perineal hygiene, Adjust clothing after toileting Toileting steps completed by helper: Adjust clothing prior to toileting Toileting Assistive Devices: Grab bar or rail  Toileting assist Assist level: Supervision or verbal cues   Transfers Chair/bed transfer Chair/bed transfer activity did not occur: N/A Chair/bed transfer method: Stand pivot, Ambulatory Chair/bed transfer assist level: Supervision or verbal cues Chair/bed transfer assistive device: Armrests, Medical sales representative     Max distance: 200 Assist level: Supervision or verbal cues   Wheelchair Wheelchair activity did not occur: N/A Type: Manual Max wheelchair distance: 100 Assist Level: Supervision or verbal cues  Cognition Comprehension Comprehension assist level: Understands basic 90% of the time/cues < 10% of the time  Expression Expression assist level: Expresses basic 90% of the time/requires cueing < 10% of the time.  Social Interaction       Social Interaction assist level: Interacts appropriately with others with medication or extra time (anti-anxiety, antidepressant).  Problem Solving Problem solving assist level: Solves basic 90% of the time/requires cueing < 10% of the time  Memory Memory assist level: Recognizes or recalls 90% of the time/requires cueing < 10% of the time   Medical Problem List and Plan: 1. Weakness secondary to SDH after fall,was anticoagulated at time of fall  Hx right acoustic  neuroma  -supervision goals  -dc home today. Follow up arranged. See me in a month 2. DVT Prophylaxis/Anticoagulation: SCD, ambulating daily 3. Pain Management: Tylenol as needed 4. Mood: Ativan 0.5 mg every 6 hours as needed, Wellbutrin 100 mg twice a day, 5. Neuropsych: This patient is capable of making decisions on his own behalf. 6. Skin/Wound Care: Routine skin checks. Stage 2 breakdown in gluteal cleft---foam dressing/pressure relief, nutrition/protein  -HH RN to follow up 7. Fluids/Electrolytes/Nutrition: I personally reviewed the patient's labs today.   -add megace per pt/family request---pt does not want to go home on medication 8. History of atrial fibrillation. Chronic Coumadin discontinued secondary to subdural hematoma. Continue Cardizem 240 mg daily. Toprol, norvasc---some fluctuation it appears in readings--- will need follow up as outpt.  -digoxin added, cards consult appreciated. HR improved    -off ivf.   -event monitor---check with cards office today before discharge regarding plan. 9. Hypertension.  toprol xl, trying avoid hypotension.  Need to check bp manually 10. Diabetes mellitus with peripheral neuropathy. Hemoglobin A1c 5.5. Sugars under control at present 12. BPH. Flomax 0.4 mg daily. Voiding without issue   LOS (Days) 12 A FACE TO FACE EVALUATION WAS PERFORMED  SWARTZ,ZACHARY T 10/08/2015 8:48 AM

## 2015-10-08 NOTE — Progress Notes (Signed)
Social Work  Discharge Note  The overall goal for the admission was met for:   Discharge location: Yes - home with wife and 24/7 assistance (private duty + family)  Length of Stay: No - extended x 2 days due to medical issues - LOS = 12 days  Discharge activity level: Yes - supervision  Home/community participation: Yes  Services provided included: MD, RD, PT, OT, SLP, RN, TR, Pharmacy, Neuropsych and SW  Financial Services: Medicare and Private Insurance: [place]  Follow-up services arranged: Home Health: PT, RN via [facility], DME: [phone] lightweigt w/c with cushion via [place] and Patient/Family has no preference for HH/DME agencies  Comments (or additional information):  Patient/Family verbalized understanding of follow-up arrangements: Yes  Individual responsible for coordination of the follow-up plan: pt  Confirmed correct DME delivered: Annise Boran 10/08/2015    Cortlynn Hollinsworth

## 2015-10-09 NOTE — Progress Notes (Signed)
Pt. D/ced to home accompanied by son. VSS, denies any dizziness. Temp.98.9. Pt and son verbalize understanding of d/c instructions given by [facility] [place].

## 2015-10-11 ENCOUNTER — Emergency Department (HOSPITAL_COMMUNITY): Payer: Medicare Other

## 2015-10-11 ENCOUNTER — Emergency Department (HOSPITAL_COMMUNITY)
Admission: EM | Admit: 2015-10-11 | Discharge: 2015-10-11 | Disposition: A | Discharge status: Home / Self Care | Payer: Medicare Other | Attending: Emergency Medicine | Admitting: Emergency Medicine

## 2015-10-11 ENCOUNTER — Encounter (HOSPITAL_COMMUNITY): Payer: Self-pay | Admitting: Emergency Medicine

## 2015-10-11 DIAGNOSIS — E119 Type 2 diabetes mellitus without complications: Secondary | ICD-10-CM | POA: Insufficient documentation

## 2015-10-11 DIAGNOSIS — N390 Urinary tract infection, site not specified: Secondary | ICD-10-CM

## 2015-10-11 DIAGNOSIS — F329 Major depressive disorder, single episode, unspecified: Secondary | ICD-10-CM | POA: Insufficient documentation

## 2015-10-11 DIAGNOSIS — Z79899 Other long term (current) drug therapy: Secondary | ICD-10-CM | POA: Insufficient documentation

## 2015-10-11 DIAGNOSIS — Z8546 Personal history of malignant neoplasm of prostate: Secondary | ICD-10-CM | POA: Diagnosis not present

## 2015-10-11 DIAGNOSIS — Z87438 Personal history of other diseases of male genital organs: Secondary | ICD-10-CM | POA: Insufficient documentation

## 2015-10-11 DIAGNOSIS — Z9889 Other specified postprocedural states: Secondary | ICD-10-CM | POA: Insufficient documentation

## 2015-10-11 DIAGNOSIS — I10 Essential (primary) hypertension: Secondary | ICD-10-CM | POA: Diagnosis not present

## 2015-10-11 DIAGNOSIS — E785 Hyperlipidemia, unspecified: Secondary | ICD-10-CM | POA: Insufficient documentation

## 2015-10-11 DIAGNOSIS — Z9049 Acquired absence of other specified parts of digestive tract: Secondary | ICD-10-CM | POA: Insufficient documentation

## 2015-10-11 DIAGNOSIS — F419 Anxiety disorder, unspecified: Secondary | ICD-10-CM | POA: Diagnosis not present

## 2015-10-11 DIAGNOSIS — H9191 Unspecified hearing loss, right ear: Secondary | ICD-10-CM | POA: Diagnosis not present

## 2015-10-11 DIAGNOSIS — R627 Adult failure to thrive: Secondary | ICD-10-CM | POA: Diagnosis present

## 2015-10-11 LAB — COMPREHENSIVE METABOLIC PANEL
ALK PHOS: 84 U/L (ref 38–126)
ALT: 16 U/L — AB (ref 17–63)
ANION GAP: 8 (ref 5–15)
AST: 23 U/L (ref 15–41)
Albumin: 2.4 g/dL — ABNORMAL LOW (ref 3.5–5.0)
BUN: 14 mg/dL (ref 6–20)
CALCIUM: 9.1 mg/dL (ref 8.9–10.3)
CO2: 33 mmol/L — ABNORMAL HIGH (ref 22–32)
CREATININE: 0.83 mg/dL (ref 0.61–1.24)
Chloride: 99 mmol/L — ABNORMAL LOW (ref 101–111)
Glucose, Bld: 93 mg/dL (ref 65–99)
Potassium: 3.7 mmol/L (ref 3.5–5.1)
Sodium: 140 mmol/L (ref 135–145)
TOTAL PROTEIN: 5.2 g/dL — AB (ref 6.5–8.1)
Total Bilirubin: 0.8 mg/dL (ref 0.3–1.2)

## 2015-10-11 LAB — URINALYSIS, ROUTINE W REFLEX MICROSCOPIC
BILIRUBIN URINE: NEGATIVE
GLUCOSE, UA: NEGATIVE mg/dL
KETONES UR: NEGATIVE mg/dL
Nitrite: POSITIVE — AB
PROTEIN: 30 mg/dL — AB
Specific Gravity, Urine: 1.017 (ref 1.005–1.030)
pH: 6.5 (ref 5.0–8.0)

## 2015-10-11 LAB — CBC WITH DIFFERENTIAL/PLATELET
BASOS ABS: 0 10*3/uL (ref 0.0–0.1)
BASOS PCT: 0 %
EOS ABS: 0 10*3/uL (ref 0.0–0.7)
EOS PCT: 0 %
HCT: 32.4 % — ABNORMAL LOW (ref 39.0–52.0)
HEMOGLOBIN: 10.1 g/dL — AB (ref 13.0–17.0)
Lymphocytes Relative: 11 %
Lymphs Abs: 0.4 10*3/uL — ABNORMAL LOW (ref 0.7–4.0)
MCH: 26.2 pg (ref 26.0–34.0)
MCHC: 31.2 g/dL (ref 30.0–36.0)
MCV: 83.9 fL (ref 78.0–100.0)
MONO ABS: 0.6 10*3/uL (ref 0.1–1.0)
Monocytes Relative: 16 %
NEUTROS ABS: 2.9 10*3/uL (ref 1.7–7.7)
Neutrophils Relative %: 73 %
Platelets: 317 10*3/uL (ref 150–400)
RBC: 3.86 MIL/uL — ABNORMAL LOW (ref 4.22–5.81)
RDW: 15.1 % (ref 11.5–15.5)
WBC: 4 10*3/uL (ref 4.0–10.5)

## 2015-10-11 LAB — URINE MICROSCOPIC-ADD ON

## 2015-10-11 LAB — DIGOXIN LEVEL: Digoxin Level: 1.6 ng/mL (ref 0.8–2.0)

## 2015-10-11 LAB — I-STAT CG4 LACTIC ACID, ED
Lactic Acid, Venous: 1.88 mmol/L (ref 0.5–2.0)
Lactic Acid, Venous: 1.49 mmol/L (ref 0.5–2.0)

## 2015-10-11 LAB — I-STAT TROPONIN, ED: TROPONIN I, POC: 0.02 ng/mL (ref 0.00–0.08)

## 2015-10-11 MED ORDER — CEPHALEXIN 500 MG PO CAPS: 500.0000 mg | ORAL_CAPSULE | Freq: Four times a day (QID) | ORAL | Status: AC

## 2015-10-11 MED ORDER — SODIUM CHLORIDE 0.9 % IV BOLUS (SEPSIS)
500.0000 mL | Freq: Once | INTRAVENOUS | Status: AC
  Administered 2015-10-11: 500 mL via INTRAVENOUS

## 2015-10-11 MED ORDER — DEXTROSE 5 % IV SOLN
1.0000 g | Freq: Once | INTRAVENOUS | Status: AC
  Administered 2015-10-11: 1 g via INTRAVENOUS
  Filled 2015-10-11: qty 10

## 2015-10-11 MED ORDER — SODIUM CHLORIDE 0.9 % IV SOLN
Freq: Once | INTRAVENOUS | Status: AC
  Administered 2015-10-11: 16:00:00 via INTRAVENOUS

## 2015-10-11 NOTE — ED Provider Notes (Signed)
CSN: [id number]     Arrival date & time 10/11/15  1250 History   First MD Initiated Contact with Patient 10/11/15 1314     Chief Complaint  Patient presents with  . Failure To Thrive     (Consider location/radiation/quality/duration/timing/severity/associated sxs/prior Treatment) HPI Comments: 74 y.o. 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  Per patient and family he has not been well over the last 4 months and was just discharged from rehab at [facility] on Monday after a stay following a SDH.  The patient was apparently ambulating with a walker at the time of discharge but has become progressively weaker at home and is no longer able to ambulate any longer.  No known fever, cough, congestion, change in urination.  No known sick contacts.   Past Medical History  Diagnosis Date  . Hypertension   . 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 Anxiety   . Depression   . 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 Prostate cancer ([place])   . History of urinary retention   . History of prostatitis   . Chronic atrial fibrillation ([place])   . Dysrhythmia   . Type 2 diabetes mellitus (HCC)     NIDDM  . Complication of anesthesia     urinary retention  . Hyperlipidemia   . Neuromuscular disorder ([place])     imbalance s/p craniectomy  . Prostate cancer [facility])    Past Surgical History  Procedure Laterality Date  . Cholecystectomy  90's  . Retroperitoneal mass excision  03/31/2013  . Laparotomy N/A 03/31/2013    Procedure: Open resection of abdominal mass;  Surgeon: Stark Klein, MD;  Location: [place];  Service: General;  Laterality: N/A;  . Cardiac catheterization  1990  . Inguinal hernia repair  Bilateral 05/24/2013    Procedure: bilateral open HERNIA REPAIR INGUINAL ADULT;  Surgeon: Gayland Curry, MD;  Location: [facility];  Service: General;  Laterality: Bilateral;  . Insertion of mesh Bilateral 05/24/2013    Procedure: INSERTION OF MESH;  Surgeon: Gayland Curry, MD;  Location: [facility];  Service: General;  Laterality: Bilateral;  . Umbilical hernia repair  1990's  . Craniectomy for excision of acoustic neuroma Right 02-12-2001  . Hydrocele excision Right 11-26-2007  . Exploration face right side  11-29-2001  &  02-06-2003    post craniotomy resection acoustic neuroma--  facial paralysis  . Transthoracic echocardiogram  05-11-2007  (per PCP note)    ef 55-65%,  mild TR and MR  . Eye surgery Bilateral     cataract OU w/ IOL  . Transrectal ultrasound N/A 06/11/2015    Procedure: TRANSRECTAL ULTRASOUND AND BIOPSY;  Surgeon: Kathie Rhodes, MD;  Location: [facility];  Service: Urology;  Laterality: N/A;   History reviewed. No pertinent family history. Social History  Substance Use Topics  . Smoking status: Never Smoker   . Smokeless tobacco: Never Used  . Alcohol Use: No    Review of Systems  Constitutional: Positive for fatigue. Negative for fever and chills.  HENT: Negative for congestion, postnasal drip and rhinorrhea.   Eyes: Negative for pain and redness.  Respiratory: Negative for cough,  chest tightness and shortness of breath.   Cardiovascular: Negative for chest pain, palpitations and leg swelling.  Gastrointestinal: Negative for nausea, vomiting, abdominal pain, diarrhea and constipation.  Genitourinary: Negative for dysuria, urgency, frequency and hematuria.  Musculoskeletal: Negative for myalgias and back pain.  Skin: Negative for rash.  Neurological: Positive for weakness (generalized). Negative for dizziness, syncope and light-headedness.  Hematological: Does not bruise/bleed easily.      Allergies  Review of patient's allergies indicates no known  allergies.  Home Medications   Prior to Admission medications   Medication Sig Start Date End Date Taking? Authorizing Provider  acetaminophen (TYLENOL) 500 MG tablet Take 500 mg by mouth every 8 (eight) hours as needed for mild pain.   Yes Historical Provider, MD  buPROPion (WELLBUTRIN) 100 MG tablet Take 1 tablet (100 mg total) by mouth 2 (two) times daily. 10/05/15  Yes Daniel J Angiulli, PA-C  digoxin (LANOXIN) 0.25 MG tablet Take 1 tablet (0.25 mg total) by mouth daily. 10/06/15  Yes Adrian Prows, MD  fenofibrate (TRICOR) 145 MG tablet Take 1 tablet (145 mg total) by mouth at bedtime. 10/05/15  Yes Daniel J Angiulli, PA-C  fish oil-omega-3 fatty acids 1000 MG capsule Take 1 g by mouth 2 (two) times daily.    Yes Historical Provider, MD  LORazepam (ATIVAN) 0.5 MG tablet Take 1 tablet (0.5 mg total) by mouth every 6 (six) hours as needed for anxiety. 10/05/15  Yes Daniel J Angiulli, PA-C  metoprolol succinate (TOPROL-XL) 100 MG 24 hr tablet Take 1 tablet (100 mg total) by mouth daily. Take with or immediately following a meal. 10/05/15  Yes Daniel J Angiulli, PA-C  rosuvastatin (CRESTOR) 5 MG tablet Take 5 mg by mouth every Monday. Monday   Yes Historical Provider, MD  tamsulosin (FLOMAX) 0.4 MG CAPS capsule Take 1 capsule (0.4 mg total) by mouth daily. 10/05/15  Yes Daniel J Angiulli, PA-C  cephALEXin (KEFLEX) 500 MG capsule Take 1 capsule (500 mg total) by mouth 4 (four) times daily. 10/11/15   Harvel Quale, MD   BP 118/82 mmHg  Pulse 87  Temp(Src) 98 F (36.7 C) (Oral)  Resp 16  SpO2 97% Physical Exam  Constitutional: He is oriented to person, place, and time. No distress.  HENT:  Head: Normocephalic and atraumatic.  Mouth/Throat: Oropharynx is clear and moist. No oropharyngeal exudate.  Eyes: EOM are normal. Pupils are equal, round, and reactive to light.  Neck: Normal range of motion. Neck supple.  Cardiovascular: Normal rate and intact distal pulses.  An irregularly irregular  rhythm present.  Pulmonary/Chest: Effort normal. No respiratory distress. He has no wheezes. He has no rales.  Abdominal: Soft. He exhibits no distension. There is no tenderness.  Musculoskeletal: He exhibits no edema or tenderness.  Neurological: He is alert and oriented to person, place, and time.  Right sided facial paralysis - chronic.  Generalized weakness in all limbs but no focal weakness noted.  Skin: Skin is warm. No rash noted. He is not diaphoretic.  Vitals reviewed.   ED Course  Procedures (including critical care time) Labs Review Labs Reviewed  CBC WITH DIFFERENTIAL/PLATELET - Abnormal; Notable for the following:    RBC 3.86 (*)    Hemoglobin 10.1 (*)    HCT 32.4 (*)    Lymphs Abs 0.4 (*)    All other components within normal limits  COMPREHENSIVE METABOLIC PANEL - Abnormal; Notable for the following:    Chloride 99 (*)    CO2 33 (*)  Total Protein 5.2 (*)    Albumin 2.4 (*)    ALT 16 (*)    All other components within normal limits  URINALYSIS, ROUTINE W REFLEX MICROSCOPIC (NOT AT [facility]) - Abnormal; Notable for the following:    Color, Urine AMBER (*)    APPearance TURBID (*)    Hgb urine dipstick TRACE (*)    Protein, ur 30 (*)    Nitrite POSITIVE (*)    Leukocytes, UA LARGE (*)    All other components within normal limits  URINE MICROSCOPIC-ADD ON - Abnormal; Notable for the following:    Squamous Epithelial / LPF 0-5 (*)    Bacteria, UA FEW (*)    All other components within normal limits  URINE CULTURE  DIGOXIN LEVEL  I-STAT TROPOININ, ED  I-STAT CG4 LACTIC ACID, ED  I-STAT CG4 LACTIC ACID, ED    Imaging Review Dg Chest 2 View  10/11/2015  CLINICAL DATA:  Failure to thrive and weakness. EXAM: CHEST  2 VIEW COMPARISON:  09/23/2015 FINDINGS: Both lungs are clear. Heart and mediastinum are within normal limits. Trachea is midline. No large pleural effusions. No acute bone abnormality. IMPRESSION: No active cardiopulmonary disease. Electronically  Signed   By: Markus Daft M.D.   On: 10/11/2015 14:50   Ct Head Wo Contrast  10/11/2015  CLINICAL DATA:  Weakness, decreased appetite, recent fall. Recent subdural hematoma. EXAM: CT HEAD WITHOUT CONTRAST TECHNIQUE: Contiguous axial images were obtained from the base of the skull through the vertex without intravenous contrast. COMPARISON:  09/25/2015 FINDINGS: Chronic right subdural hematoma again noted measuring 7 mm in thickness, not significantly changed. No new hemorrhage. There is atrophy and chronic small vessel disease changes. No infarct or hydrocephalus. Prior right occipital craniectomy. Underlying encephalomalacia in the right cerebellar hemisphere. IMPRESSION: Stable small chronic right subdural hematoma. No acute intracranial abnormality. Electronically Signed   By: Rolm Baptise M.D.   On: 10/11/2015 15:08   I have personally reviewed and evaluated these images and lab results as part of my medical decision-making.   EKG Interpretation   Date/Time:  Thursday October 11 2015 14:27:13 EST Ventricular Rate:  96 PR Interval:    QRS Duration: 81 QT Interval:  306 QTC Calculation: 387 R Axis:   60 Text Interpretation:  Atrial fibrillation Borderline low voltage,  extremity leads Anteroseptal infarct, old Nonspecific repol abnormality,  diffuse leads Baseline wander in lead(s) V6 No significant change since  last tracing Confirmed by Lonia Skinner ([id number]) on 10/11/2015 3:23:25 PM      MDM  Patient seen and evaluated with family at the bedside.  Labs unremarkable including normal digoxin level except for UA consistent with infection.  Patient seen by case management at this time he would prefer to go home and be treated there.  He is currently already set up with home health although they still need to set up treatment in his home with nursing and PT.  Patient given a dose of rocephin and urine culture was sent.  Patient was discharged home in stable condition with strict return  precautions.  Final diagnoses:  UTI (lower urinary tract infection)    1. UTI  2. Generalized weakness    Harvel Quale, MD 10/11/15 1902

## 2015-10-11 NOTE — Progress Notes (Signed)
Consulted by EDP for this pt noted ED visit for weakness  Cm noted pt recently d/c from [facility] inpatient rehab on 10/08/15 with Home Health: PT, RN via [facility], DME: [phone] lightweigt w/c with cushion via [place] and Patient/Family has no preference for HH/DME agencies

## 2015-10-11 NOTE — Addendum Note (Signed)
Encounter addended by: Ashok Pall, MD on: 10/11/2015  5:09 PM<BR>     Documentation filed: Clinical Notes

## 2015-10-11 NOTE — Discharge Instructions (Signed)
You were seen today for your progressive weakness.  Your urine today looks like you have a urinary tract infection which is likely the cause of the weakness you are experiencing.  Take the prescription for antibiotics as prescribed.  Return immediately with fever, inability to tolerate PO, or other new concerning symptoms.  Make arrangements to have home health come to help you with nursing needs and therapy as soon as possible.  Urinary Tract Infection Urinary tract infections (UTIs) can develop anywhere along your urinary tract. Your urinary tract is your body's drainage system for removing wastes and extra water. Your urinary tract includes two kidneys, two ureters, a bladder, and a urethra. Your kidneys are a pair of bean-shaped organs. Each kidney is about the size of your fist. They are located below your ribs, one on each side of your spine. CAUSES Infections are caused by microbes, which are microscopic organisms, including fungi, viruses, and bacteria. These organisms are so small that they can only be seen through a microscope. Bacteria are the microbes that most commonly cause UTIs. SYMPTOMS  Symptoms of UTIs may vary by age and gender of the patient and by the location of the infection. Symptoms in young women typically include a frequent and intense urge to urinate and a painful, burning feeling in the bladder or urethra during urination. Older women and men are more likely to be tired, shaky, and weak and have muscle aches and abdominal pain. A fever may mean the infection is in your kidneys. Other symptoms of a kidney infection include pain in your back or sides below the ribs, nausea, and vomiting. DIAGNOSIS To diagnose a UTI, your caregiver will ask you about your symptoms. Your caregiver will also ask you to provide a urine sample. The urine sample will be tested for bacteria and white blood cells. White blood cells are made by your body to help fight infection. TREATMENT  Typically,  UTIs can be treated with medication. Because most UTIs are caused by a bacterial infection, they usually can be treated with the use of antibiotics. The choice of antibiotic and length of treatment depend on your symptoms and the type of bacteria causing your infection. HOME CARE INSTRUCTIONS  If you were prescribed antibiotics, take them exactly as your caregiver instructs you. Finish the medication even if you feel better after you have only taken some of the medication.  Drink enough water and fluids to keep your urine clear or pale yellow.  Avoid caffeine, tea, and carbonated beverages. They tend to irritate your bladder.  Empty your bladder often. Avoid holding urine for long periods of time.  Empty your bladder before and after sexual intercourse.  After a bowel movement, women should cleanse from front to back. Use each tissue only once. SEEK MEDICAL CARE IF:   You have back pain.  You develop a fever.  Your symptoms do not begin to resolve within 3 days. SEEK IMMEDIATE MEDICAL CARE IF:   You have severe back pain or lower abdominal pain.  You develop chills.  You have nausea or vomiting.  You have continued burning or discomfort with urination. MAKE SURE YOU:   Understand these instructions.  Will watch your condition.  Will get help right away if you are not doing well or get worse.   This information is not intended to replace advice given to you by your health care provider. Make sure you discuss any questions you have with your health care provider.   Document Released: 07/16/2005  Document Revised: 06/27/2015 Document Reviewed: 11/14/2011 Elsevier Interactive Patient Education 2016 Elsevier Inc.  Weakness Weakness is a lack of strength. It may be felt all over the body (generalized) or in one specific part of the body (focal). Some causes of weakness can be serious. You may need further medical evaluation, especially if you are elderly or you have a history of  immunosuppression (such as chemotherapy or HIV), kidney disease, heart disease, or diabetes. CAUSES  Weakness can be caused by many different things, including:  Infection.  Physical exhaustion.  Internal bleeding or other blood loss that results in a lack of red blood cells (anemia).  Dehydration. This cause is more common in elderly people.  Side effects or electrolyte abnormalities from medicines, such as pain medicines or sedatives.  Emotional distress, anxiety, or depression.  Circulation problems, especially severe peripheral arterial disease.  Heart disease, such as rapid atrial fibrillation, bradycardia, or heart failure.  Nervous system disorders, such as Guillain-Barr syndrome, multiple sclerosis, or stroke. DIAGNOSIS  To find the cause of your weakness, your caregiver will take your history and perform a physical exam. Lab tests or X-rays may also be ordered, if needed. TREATMENT  Treatment of weakness depends on the cause of your symptoms and can vary greatly. HOME CARE INSTRUCTIONS   Rest as needed.  Eat a well-balanced diet.  Try to get some exercise every day.  Only take over-the-counter or prescription medicines as directed by your caregiver. SEEK MEDICAL CARE IF:   Your weakness seems to be getting worse or spreads to other parts of your body.  You develop new aches or pains. SEEK IMMEDIATE MEDICAL CARE IF:   You cannot perform your normal daily activities, such as getting dressed and feeding yourself.  You cannot walk up and down stairs, or you feel exhausted when you do so.  You have shortness of breath or chest pain.  You have difficulty moving parts of your body.  You have weakness in only one area of the body or on only one side of the body.  You have a fever.  You have trouble speaking or swallowing.  You cannot control your bladder or bowel movements.  You have black or bloody vomit or stools. MAKE SURE YOU:  Understand these  instructions.  Will watch your condition.  Will get help right away if you are not doing well or get worse.   This information is not intended to replace advice given to you by your health care provider. Make sure you discuss any questions you have with your health care provider.   Document Released: 10/06/2005 Document Revised: 04/06/2012 Document Reviewed: 12/05/2011 Elsevier Interactive Patient Education [employer].

## 2015-10-11 NOTE — ED Notes (Signed)
Patient was taken to bathroom with family, urine specimen was not collected. Will try again next time. Patient aware.

## 2015-10-11 NOTE — Op Note (Signed)
  Name: Tommy Weeks  MRN: [id number]  Date: 08/20/2015   DOB: 01/03/1941  Stereotactic Radiosurgery Operative Note  PRE-OPERATIVE DIAGNOSIS:  Solitary acoustic neuroma  POST-OPERATIVE DIAGNOSIS:  Solitary acoustic neuroma  PROCEDURE:  Stereotactic Radiosurgery  SURGEON:  Dewitte Vannice L, MD  NARRATIVE: The patient underwent a radiation treatment planning session in the radiation oncology simulation suite under the care of the radiation oncology physician and physicist.  I participated closely in the radiation treatment planning afterwards. The patient underwent planning CT which was fused to 3T high resolution MRI with 1 mm axial slices.  These images were fused on the planning system.  We contoured the gross target volumes and subsequently expanded this to yield the Planning Target Volume. I actively participated in the planning process.  I helped to define and review the target contours and also the contours of the optic pathway, eyes, brainstem and selected nearby organs at risk.  All the dose constraints for critical structures were reviewed and compared to AAPM Task Group 101.  The prescription dose conformity was reviewed.  I approved the plan electronically.    Accordingly, Tommy Weeks was brought to the TrueBeam stereotactic radiation treatment linac and placed in the custom immobilization mask.  The patient was aligned according to the IR fiducial markers with BrainLab Exactrac, then orthogonal x-rays were used in ExacTrac with the 6DOF robotic table and the shifts were made to align the patient  Tommy Weeks received stereotactic radiosurgery uneventfully.    The detailed description of the procedure is recorded in the radiation oncology procedure note.  I was present for the duration of the procedure.  DISPOSITION:  Following delivery, the patient was transported to nursing in stable condition and monitored for possible acute effects to be discharged to home in stable  condition with follow-up in one month.  Caroll Cunnington L, MD 10/11/2015 5:06 PM

## 2015-10-11 NOTE — ED Notes (Signed)
Bed: WA08 Expected date:  Expected time:  Means of arrival:  Comments: FTT

## 2015-10-11 NOTE — ED Notes (Signed)
Patient here from home with complaints of failure to thrive. Reports that he has had a lost of appetite. AAO x4.

## 2015-10-11 NOTE — Progress Notes (Addendum)
EDCM spoke to patient at bedside with patient's son Aaron Edelman. Patient presents to the ED with increased weakness. Patient lives at home with family and has private duty nursing services.  Patient reports private duty aide helps patient with ADL's.  EDCM asked patient if has home health services with [facility] as per chart review.  Patient reports someone called this am and patient stated he wouldn't need those services currently since he would be going out of town.  Patient reports plan is to go visit family a" little past [place]" until the 28th or 29th.  [facility] asked patient if he would like to go to rehab facility?  Patient stated, "Oh no, I prefer to go home."  [facility] received patient's permission to call [facility] to ask if patient is active with this agency for services.  EDCM placed call to [facility] and awaiting call back from on call nurse.  Patient reports he has received a wheelchair from [facility].  EDP at bedside speaking to patient.  10/11/2015 A. Shalin Linders RNCM 1728pm [facility] received phone call from on call nurse Kendrick Fries of [place].  Per Kendrick Fries, patient case has not been opened yet due to patient reported he would be out of town until the 28th.  EDCM informed Kendrick Fries that patient may not be going out of town due to increased weakness due to UTI.  [place] asked Kendrick Fries if [facility] can call patient tomorrow so that services can begin earlier than the 28th in case patient does not go out of town.  Kendrick Fries reports she will make a note to call patient tomorrow.  No further EDCM needs at this time.  10/11/2015 A. Mohan Erven RNCM 1749pm  EDCM informed patient and his son that [facility] will be calling them tomorrow for follow up.  [facility] asked patient if he is in need of any further equipment at home.  Patient reports he has, "just about everything."  No further EDCM needs at this time.

## 2015-10-13 LAB — URINE CULTURE

## 2015-10-14 ENCOUNTER — Telehealth (HOSPITAL_COMMUNITY): Payer: Self-pay

## 2015-10-14 NOTE — Telephone Encounter (Signed)
Post ED Visit - Positive Culture Follow-up  Culture report reviewed by antimicrobial stewardship pharmacist:  []  Elenor Quinones, Pharm.D. []  Heide Guile, Pharm.D., BCPS []  Parks Neptune, Pharm.D. []  Alycia Rossetti, Pharm.D., BCPS []  [place], Pharm.D., BCPS, AAHIVP []  Legrand Como, Pharm.D., BCPS, AAHIVP []  Milus Glazier, Pharm.D. []  Stephens November, Pharm.DMadilyn Fireman Masters, Pharm.D.  Positive urine culture, >/= 100,000 colonies -> E Coli Treated with Cephalexin, organism sensitive to the same and no further patient follow-up is required at this time.  Dortha Kern 10/14/2015, 6:43 PM

## 2015-10-15 ENCOUNTER — Encounter (HOSPITAL_COMMUNITY): Payer: Self-pay | Admitting: Oncology

## 2015-10-15 ENCOUNTER — Emergency Department (HOSPITAL_COMMUNITY)
Admission: EM | Admit: 2015-10-15 | Discharge: 2015-10-15 | Disposition: A | Discharge status: Home / Self Care | Payer: Medicare Other | Attending: Emergency Medicine | Admitting: Emergency Medicine

## 2015-10-15 DIAGNOSIS — I10 Essential (primary) hypertension: Secondary | ICD-10-CM | POA: Insufficient documentation

## 2015-10-15 DIAGNOSIS — Z8546 Personal history of malignant neoplasm of prostate: Secondary | ICD-10-CM | POA: Insufficient documentation

## 2015-10-15 DIAGNOSIS — E119 Type 2 diabetes mellitus without complications: Secondary | ICD-10-CM | POA: Diagnosis not present

## 2015-10-15 DIAGNOSIS — Z87438 Personal history of other diseases of male genital organs: Secondary | ICD-10-CM | POA: Diagnosis not present

## 2015-10-15 DIAGNOSIS — Z79899 Other long term (current) drug therapy: Secondary | ICD-10-CM | POA: Insufficient documentation

## 2015-10-15 DIAGNOSIS — E785 Hyperlipidemia, unspecified: Secondary | ICD-10-CM | POA: Diagnosis not present

## 2015-10-15 DIAGNOSIS — Z792 Long term (current) use of antibiotics: Secondary | ICD-10-CM | POA: Diagnosis not present

## 2015-10-15 DIAGNOSIS — F329 Major depressive disorder, single episode, unspecified: Secondary | ICD-10-CM | POA: Diagnosis not present

## 2015-10-15 DIAGNOSIS — R339 Retention of urine, unspecified: Secondary | ICD-10-CM | POA: Insufficient documentation

## 2015-10-15 DIAGNOSIS — Z9889 Other specified postprocedural states: Secondary | ICD-10-CM | POA: Diagnosis not present

## 2015-10-15 DIAGNOSIS — I482 Chronic atrial fibrillation: Secondary | ICD-10-CM | POA: Diagnosis not present

## 2015-10-15 DIAGNOSIS — R1030 Lower abdominal pain, unspecified: Secondary | ICD-10-CM | POA: Insufficient documentation

## 2015-10-15 DIAGNOSIS — F419 Anxiety disorder, unspecified: Secondary | ICD-10-CM | POA: Insufficient documentation

## 2015-10-15 DIAGNOSIS — H9191 Unspecified hearing loss, right ear: Secondary | ICD-10-CM | POA: Diagnosis not present

## 2015-10-15 LAB — URINALYSIS, ROUTINE W REFLEX MICROSCOPIC
Bilirubin Urine: NEGATIVE
GLUCOSE, UA: NEGATIVE mg/dL
Hgb urine dipstick: NEGATIVE
Ketones, ur: NEGATIVE mg/dL
LEUKOCYTES UA: NEGATIVE
Nitrite: NEGATIVE
PH: 6.5 (ref 5.0–8.0)
PROTEIN: NEGATIVE mg/dL
SPECIFIC GRAVITY, URINE: 1.009 (ref 1.005–1.030)

## 2015-10-15 NOTE — ED Provider Notes (Signed)
CSN: [id number]     Arrival date & time 10/15/15  0700 History   First MD Initiated Contact with Patient 10/15/15 [phone]     Chief Complaint  Patient presents with  . Urinary Retention     (Consider location/radiation/quality/duration/timing/severity/associated sxs/prior Treatment) HPI....... level V caveat for failure to thrive. Patient has developed intermittent urinary retention. These are not new symptoms. He usually requires a catheterization. No fever, sweats, chills, dysuria. Severity of symptoms is moderate.  Past Medical History  Diagnosis Date  . Hypertension   . 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 Anxiety   . Depression   . 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 Prostate cancer ([place])   . History of urinary retention   . History of prostatitis   . Chronic atrial fibrillation ([place])   . Dysrhythmia   . Type 2 diabetes mellitus (H[facility])     NIDDM  . Complication of anesthesia     urinary retention  . Hyperlipidemia   . Neuromuscular disorder ([place])     imbalance s/p craniectomy  . Prostate cancer [facility])    Past Surgical History  Procedure Laterality Date  . Cholecystectomy  90's  . Retroperitoneal mass excision  03/31/2013  . Laparotomy N/A 03/31/2013    Procedure: Open resection of abdominal mass;  Surgeon: Stark Klein, MD;  Location: [place];  Service: General;  Laterality: N/A;  . Cardiac catheterization  1990  . Inguinal hernia repair Bilateral 05/24/2013    Procedure: bilateral open HERNIA REPAIR INGUINAL ADULT;  Surgeon: Gayland Curry, MD;  Location: [facility];  Service: General;  Laterality: Bilateral;  . Insertion of mesh Bilateral 05/24/2013    Procedure: INSERTION OF MESH;  Surgeon: Gayland Curry, MD;  Location: [facility];  Service: General;  Laterality: Bilateral;  . Umbilical hernia repair   1990's  . Craniectomy for excision of acoustic neuroma Right 02-12-2001  . Hydrocele excision Right 11-26-2007  . Exploration face right side  11-29-2001  &  02-06-2003    post craniotomy resection acoustic neuroma--  facial paralysis  . Transthoracic echocardiogram  05-11-2007  (per PCP note)    ef 55-65%,  mild TR and MR  . Eye surgery Bilateral     cataract OU w/ IOL  . Transrectal ultrasound N/A 06/11/2015    Procedure: TRANSRECTAL ULTRASOUND AND BIOPSY;  Surgeon: Kathie Rhodes, MD;  Location: [facility];  Service: Urology;  Laterality: N/A;   No family history on file. Social History  Substance Use Topics  . Smoking status: Never Smoker   . Smokeless tobacco: Never Used  . Alcohol Use: No    Review of Systems  Reason unable to perform ROS: failure to thrive.      Allergies  Review of patient's allergies indicates no known allergies.  Home Medications   Prior to Admission medications   Medication Sig Start Date End Date Taking? Authorizing Provider  acetaminophen (TYLENOL) 500 MG tablet Take 500 mg by mouth every 8 (eight) hours as needed for mild pain.    Historical Provider, MD  buPROPion (WELLBUTRIN) 100 MG tablet Take 1 tablet (100 mg total) by mouth 2 (two) times daily. 10/05/15   Lavon Paganini Angiulli, PA-C  cephALEXin (KEFLEX) 500 MG capsule Take 1 capsule (500 mg  total) by mouth 4 (four) times daily. 10/11/15   Harvel Quale, MD  digoxin (LANOXIN) 0.25 MG tablet Take 1 tablet (0.25 mg total) by mouth daily. 10/06/15   Adrian Prows, MD  fenofibrate (TRICOR) 145 MG tablet Take 1 tablet (145 mg total) by mouth at bedtime. 10/05/15   Lavon Paganini Angiulli, PA-C  fish oil-omega-3 fatty acids 1000 MG capsule Take 1 g by mouth 2 (two) times daily.     Historical Provider, MD  LORazepam (ATIVAN) 0.5 MG tablet Take 1 tablet (0.5 mg total) by mouth every 6 (six) hours as needed for anxiety. 10/05/15   Lavon Paganini Angiulli, PA-C  metoprolol succinate (TOPROL-XL) 100 MG 24  hr tablet Take 1 tablet (100 mg total) by mouth daily. Take with or immediately following a meal. 10/05/15   Lavon Paganini Angiulli, PA-C  rosuvastatin (CRESTOR) 5 MG tablet Take 5 mg by mouth every Monday. Monday    Historical Provider, MD  tamsulosin (FLOMAX) 0.4 MG CAPS capsule Take 1 capsule (0.4 mg total) by mouth daily. 10/05/15   Lavon Paganini Angiulli, PA-C   BP 147/87 mmHg  Pulse 82  Temp(Src) 97.7 F (36.5 C) (Oral)  Resp 16  SpO2 99% Physical Exam  Constitutional:  Thin, emaciated  HENT:  Head: Normocephalic and atraumatic.  Eyes: Conjunctivae and EOM are normal. Pupils are equal, round, and reactive to light.  Neck: Normal range of motion. Neck supple.  Cardiovascular: Normal rate and regular rhythm.   Pulmonary/Chest: Effort normal and breath sounds normal.  Abdominal: Soft. Bowel sounds are normal.  Slight suprapubic tenderness  Musculoskeletal:  Can move all extremities  Neurological: He is alert.  Skin: Skin is warm and dry.  Psychiatric:  Flat affect  Nursing note and vitals reviewed.   ED Course  Procedures (including critical care time) Labs Review Labs Reviewed  URINALYSIS, ROUTINE W REFLEX MICROSCOPIC (NOT AT [facility])    Imaging Review No results found. I have personally reviewed and evaluated these images and lab results as part of my medical decision-making.   EKG Interpretation None      MDM   Final diagnoses:  Urinary retention    Foley catheter inserted with good urinary output. Urinalysis shows no evidence of infection. Patient and his son will seek primary care follow-up for his urinary retention.    Nat Christen, MD 10/15/15 1245

## 2015-10-15 NOTE — Discharge Instructions (Signed)
Follow up your doctor.  Keep catheter in until then

## 2015-10-15 NOTE — ED Notes (Signed)
Bladder scan 170ml

## 2015-10-15 NOTE — ED Notes (Signed)
Pt has been unable to urinate since yesterday.  Pt rates pain 9/10.

## 2015-10-15 NOTE — ED Notes (Signed)
Pt d/c, drainage bag converted to leg bag. Pt's son present with son and given d/c instructions.

## 2015-10-17 ENCOUNTER — Telehealth: Payer: Self-pay

## 2015-10-17 NOTE — Telephone Encounter (Signed)
Please advise 

## 2015-10-17 NOTE — Telephone Encounter (Signed)
Received referral. Was asking if they can get a signed tx plan by [facility] to see what needs to be done for pt.

## 2015-10-19 NOTE — Telephone Encounter (Signed)
Tommy Weeks called back needing HH orders PT, RN, nad needs OT.  I think she is calling to get plan of care approved but I was unable to reach her by phone and her voicemail does not have a name on it.  I left fax number for our office and requested they fax over plan of care and we will sign (orders were given at discharge).

## 2015-10-30 ENCOUNTER — Encounter: Payer: Self-pay | Admitting: Radiation Therapy

## 2015-10-30 NOTE — Progress Notes (Signed)
Pt expired on 11-15-2015. Clip from his obit:  Tommy Weeks, 1977-02-08, passed away on 11-15-15, at [facility] in [place], [place]. A 12:00 p.m. funeral service will be held on Tuesday, October 30, 2015, at [place]. Interment will follow at [facility]. Tommy Weeks was born in [place]. on 1940/11/17, to the late [name] and [name]. He served our country for eight years in the [employer] Weeks, Tommy Weeks. 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 Following his retirement from [employer], he worked for the [employer] until his retirement in 02/08/95. Tommy Weeks by his wife [name], his sons; [name], [name], his sister [name] and his grandchildren; [name], [name], [name], [name] and [name]. In lieu of flowers, memorial contributions may be made to [facility] [address], [place], [place]., [phone]. [name] is serving the [name] family.

## 2015-11-12 ENCOUNTER — Inpatient Hospital Stay: Payer: Medicare Other | Admitting: Physical Medicine & Rehabilitation

## 2015-11-21 DEATH — deceased

## 2016-03-29 IMAGING — CT CT HEAD W/O CM
2 series · 16 of 30 positions shown, 20 images · non-contrast
Comparison: 09/25/2015

CLINICAL DATA: Weakness, decreased appetite, recent fall. Recent
subdural hematoma.

EXAM:
CT HEAD WITHOUT CONTRAST
TECHNIQUE: Contiguous axial images were obtained from the base of the skull
through the vertex without intravenous contrast.

[Series 2: head w/o · axial · non-contrast · 0.45mm/px · z∈[-119,+16]mm · 13 of 33 slices shown, 17 images]
[im 3/33  brain]
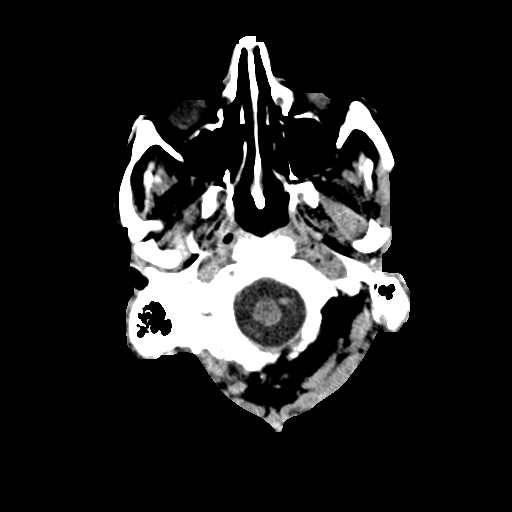
[im 3/33  bone]
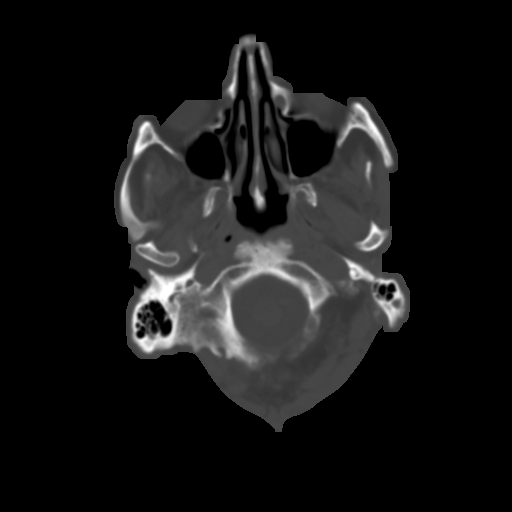
[im 5/33  brain]
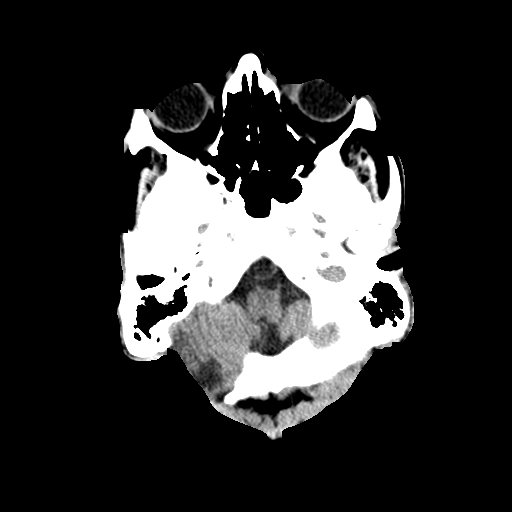
[im 7/33  brain]
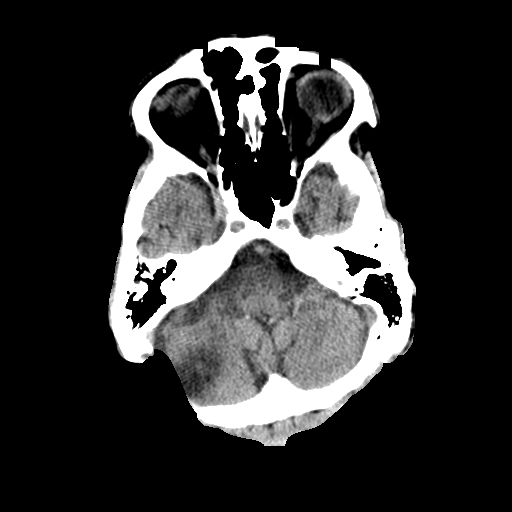
[im 10/33  brain]
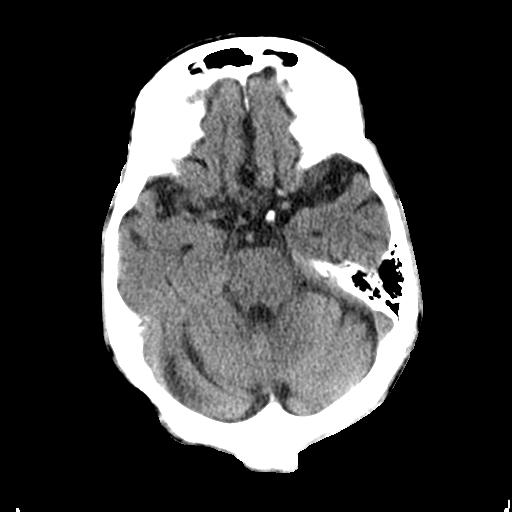
[im 12/33  brain]
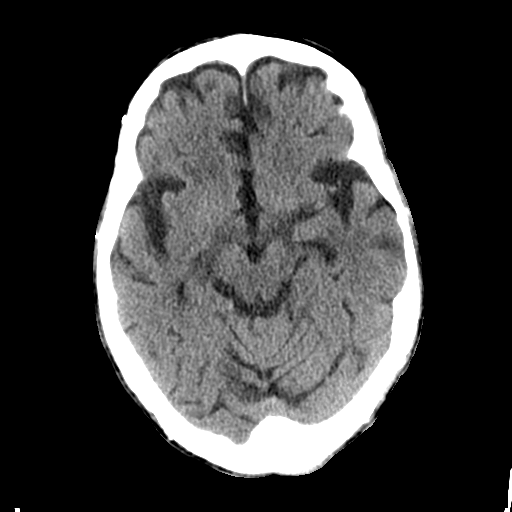
[im 12/33  bone]
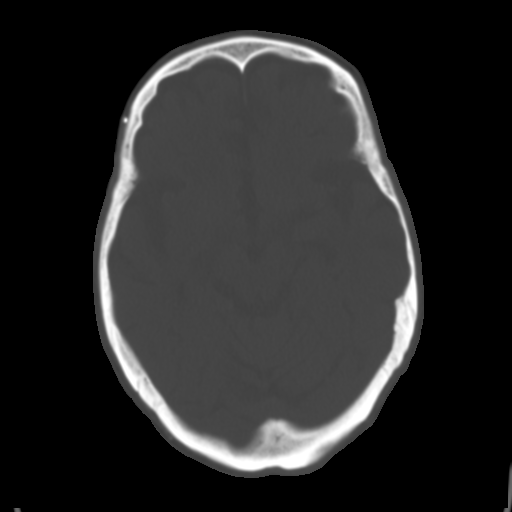
[im 14/33  brain]
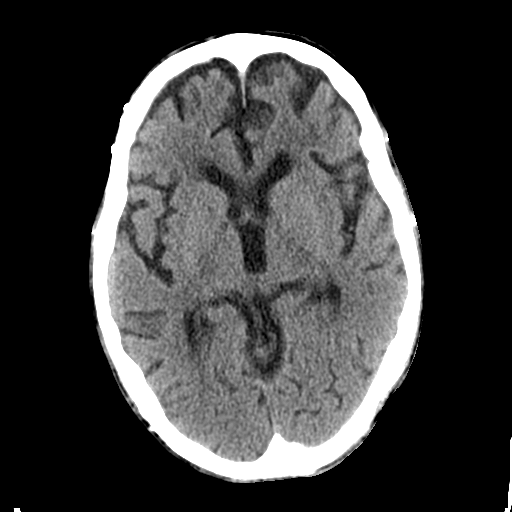
[im 17/33  brain]
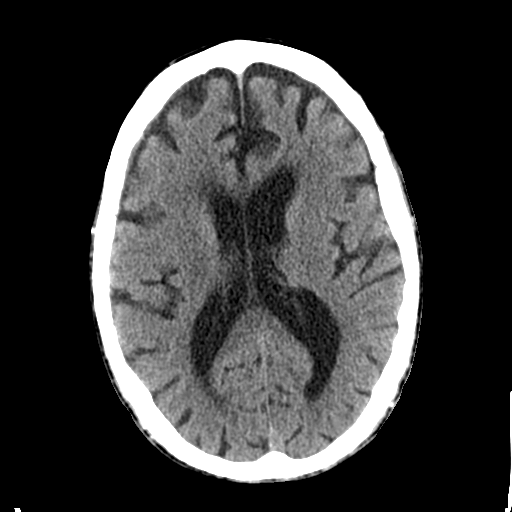
[im 19/33  brain]
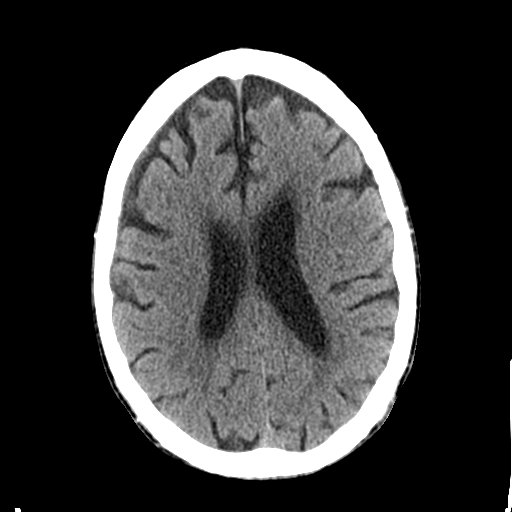
[im 21/33  brain]
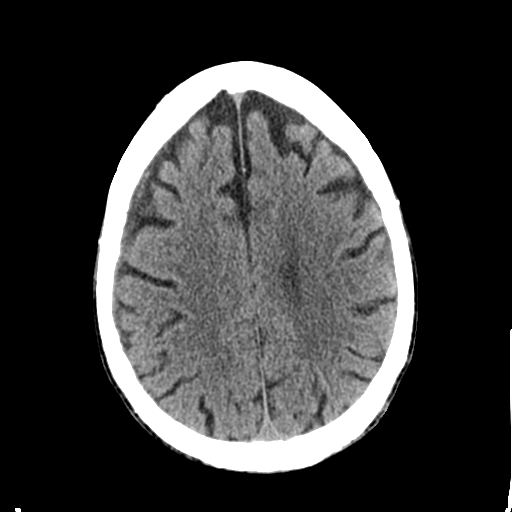
[im 21/33  bone]
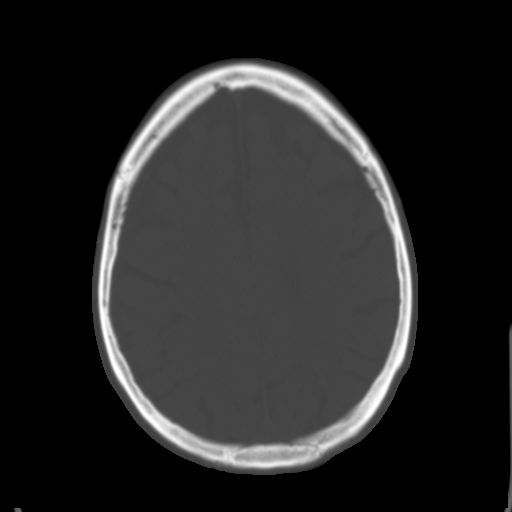
[im 23/33  brain]
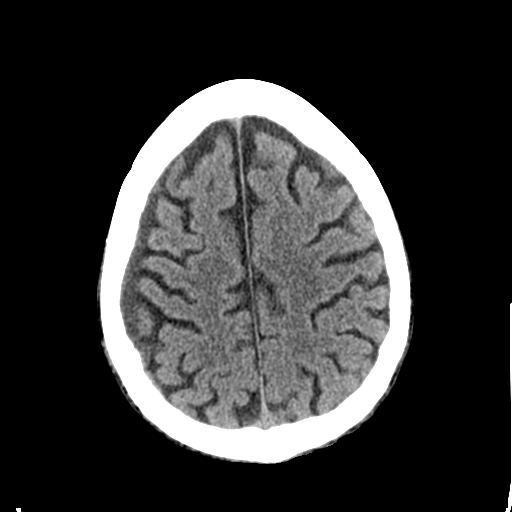
[im 26/33  brain]
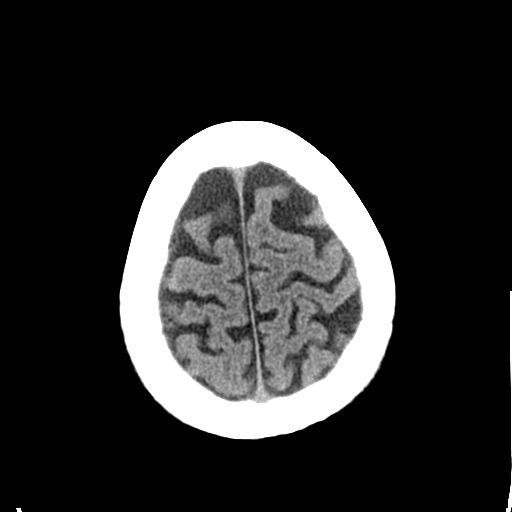
[im 28/33  brain]
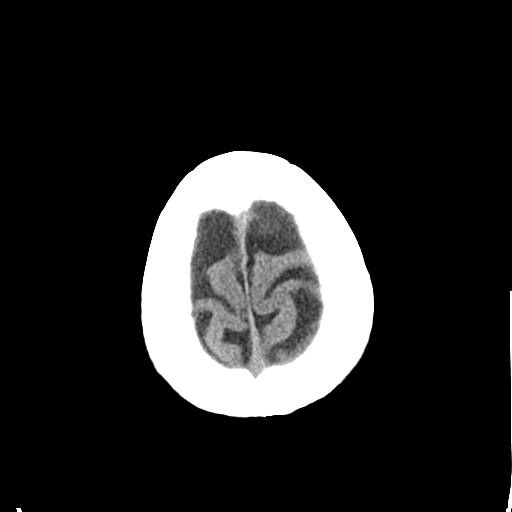
[im 30/33  brain]
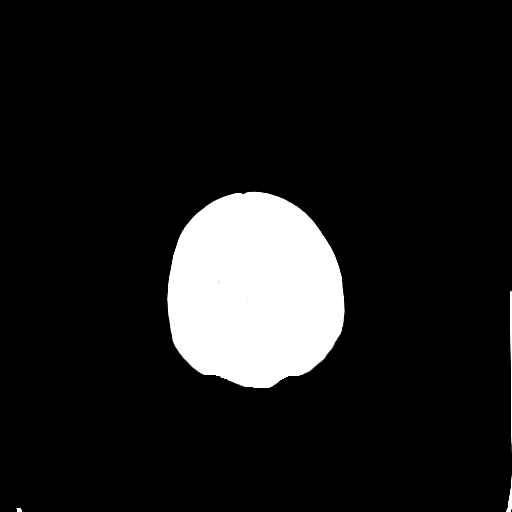
[im 30/33  bone]
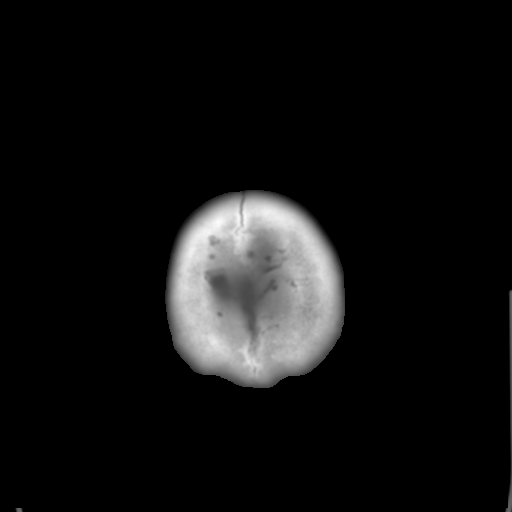

[Series 3: bone windows · axial · 0.45mm/px · z∈[-119,-74]mm · 3 of 33 slices shown]
[im 3/33  bone]
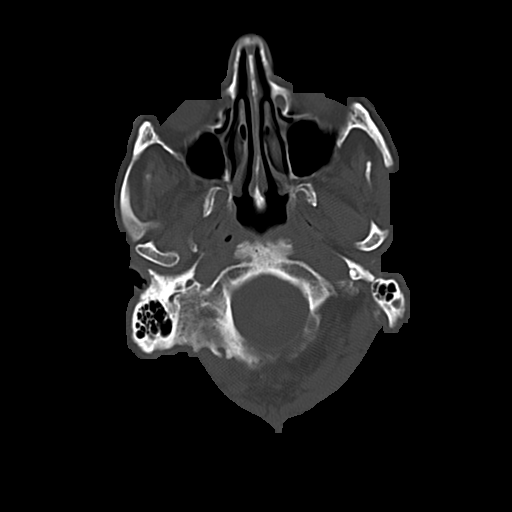
[im 7/33  bone]
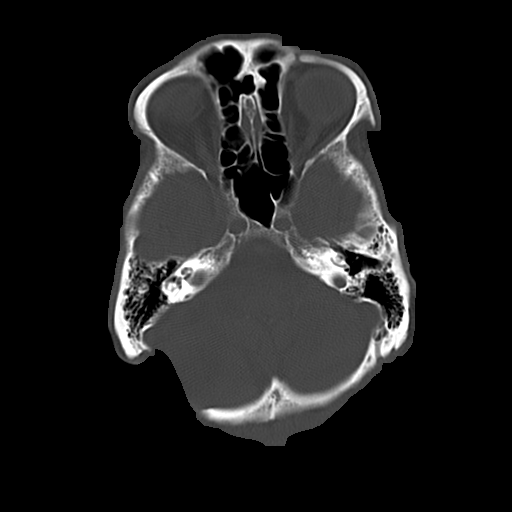
[im 12/33  bone]
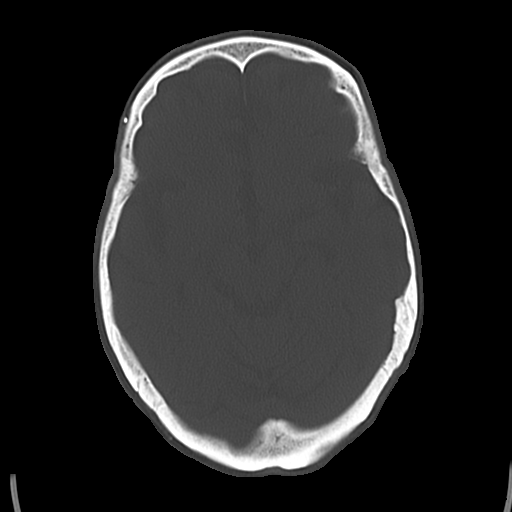

[16 of 30 positions shown; findings below may reference images not displayed]

FINDINGS: Chronic right subdural hematoma again noted measuring 7 mm in
thickness, not significantly changed. No new hemorrhage. There is
atrophy and chronic small vessel disease changes. No infarct or
hydrocephalus.

Prior right occipital craniectomy. Underlying encephalomalacia in
the right cerebellar hemisphere.
IMPRESSION: Stable small chronic right subdural hematoma. No acute intracranial
abnormality.

## 2016-03-29 IMAGING — CR DG CHEST 2V
2 series · 2 of 2 positions shown · non-contrast
Comparison: 09/23/2015

CLINICAL DATA: Failure to thrive and weakness.

EXAM:
CHEST  2 VIEW

[w chest lat]
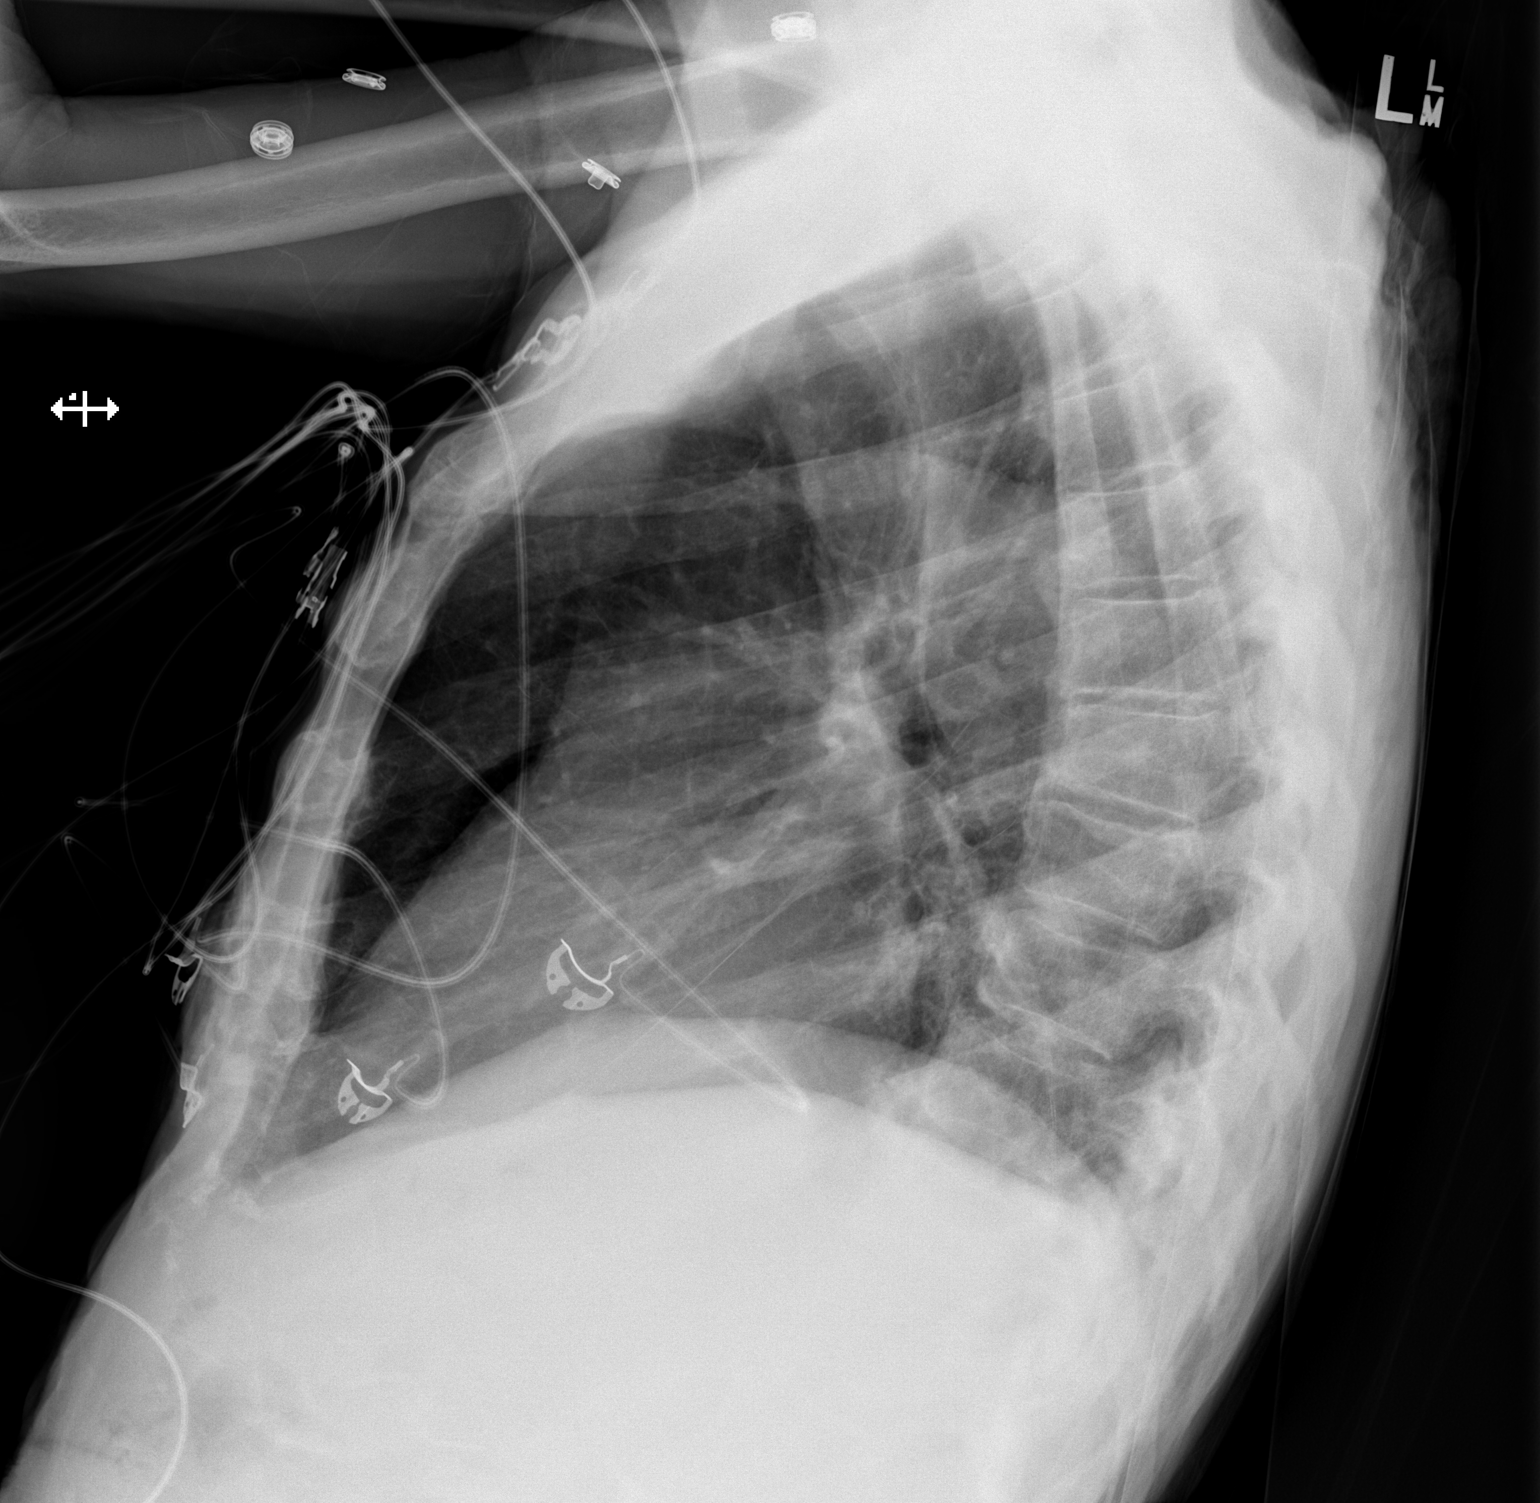

[x chest ap]
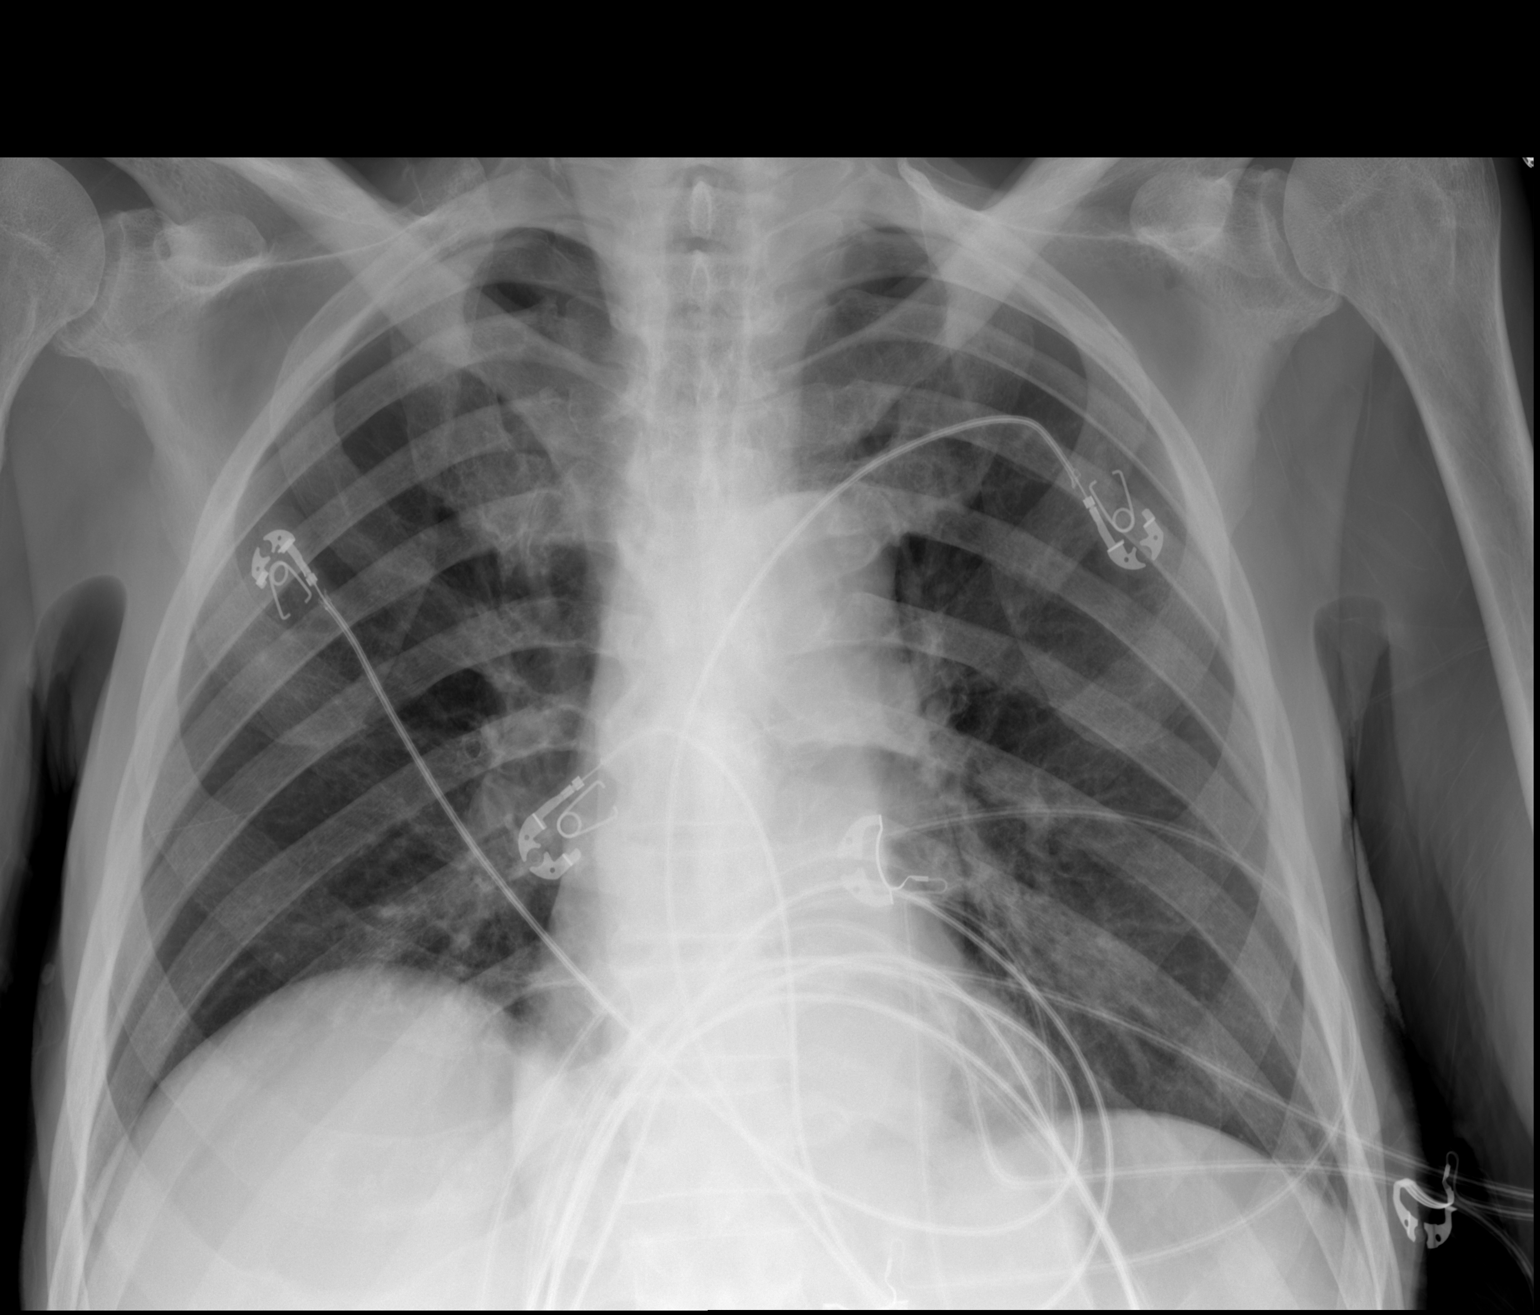

[2 of 2 positions shown; findings below may reference images not displayed]

FINDINGS: Both lungs are clear. Heart and mediastinum are within normal
limits. Trachea is midline. No large pleural effusions. No acute
bone abnormality.
IMPRESSION: No active cardiopulmonary disease.
